# Patient Record
Sex: Male | Born: 1965 | Race: White | Hispanic: No | Smoking: Former smoker
Health system: Southern US, Community
[De-identification: ages and names within clinical notes are randomized; demographics above are authoritative.]

## PROBLEM LIST (undated history)

## (undated) DIAGNOSIS — I10 Essential (primary) hypertension: Secondary | ICD-10-CM

## (undated) DIAGNOSIS — K7460 Unspecified cirrhosis of liver: Secondary | ICD-10-CM

## (undated) HISTORY — PX: PARACENTESIS: SHX844

---

## 2013-10-30 LAB — CBC AND DIFFERENTIAL
HCT: 41 % (ref 41–53)
HEMOGLOBIN: 15 g/dL (ref 13.5–17.5)
Platelets: 248 10*3/uL (ref 150–399)
WBC: 20.4 10^3/mL

## 2013-10-30 LAB — TSH: TSH: 7.88 u[IU]/mL — AB (ref <=5.90)

## 2013-10-31 ENCOUNTER — Inpatient Hospital Stay: Payer: Self-pay | Admitting: Internal Medicine

## 2013-10-31 LAB — COMPREHENSIVE METABOLIC PANEL
ALK PHOS: 399 U/L — AB
AST: 187 U/L — AB (ref 15–37)
Albumin: 2 g/dL — ABNORMAL LOW (ref 3.4–5.0)
Anion Gap: 11 (ref 7–16)
BUN: 16 mg/dL (ref 7–18)
Bilirubin,Total: 27.5 mg/dL — ABNORMAL HIGH (ref 0.2–1.0)
CO2: 28 mmol/L (ref 21–32)
Calcium, Total: 8.2 mg/dL — ABNORMAL LOW (ref 8.5–10.1)
Chloride: 92 mmol/L — ABNORMAL LOW (ref 98–107)
Creatinine: 1.79 mg/dL — ABNORMAL HIGH (ref 0.60–1.30)
EGFR (Non-African Amer.): 43 — ABNORMAL LOW
GFR CALC AF AMER: 53 — AB
Glucose: 94 mg/dL (ref 65–99)
OSMOLALITY: 264 (ref 275–301)
Potassium: 2.8 mmol/L — ABNORMAL LOW (ref 3.5–5.1)
SGPT (ALT): 92 U/L — ABNORMAL HIGH
SODIUM: 131 mmol/L — AB (ref 136–145)
Total Protein: 5.4 g/dL — ABNORMAL LOW (ref 6.4–8.2)

## 2013-10-31 LAB — CBC WITH DIFFERENTIAL/PLATELET
BASOS ABS: 0.1 10*3/uL (ref 0.0–0.1)
BASOS PCT: 0.5 %
Eosinophil #: 0.1 10*3/uL (ref 0.0–0.7)
Eosinophil %: 0.8 %
HCT: 41.4 % (ref 40.0–52.0)
HGB: 13.9 g/dL (ref 13.0–18.0)
LYMPHS ABS: 1.2 10*3/uL (ref 1.0–3.6)
LYMPHS PCT: 6.9 %
MCH: 38.1 pg — ABNORMAL HIGH (ref 26.0–34.0)
MCHC: 33.4 g/dL (ref 32.0–36.0)
MCV: 114 fL — ABNORMAL HIGH (ref 80–100)
MONO ABS: 2 x10 3/mm — AB (ref 0.2–1.0)
MONOS PCT: 11.9 %
Neutrophil #: 13.4 10*3/uL — ABNORMAL HIGH (ref 1.4–6.5)
Neutrophil %: 79.9 %
Platelet: 231 10*3/uL (ref 150–440)
RBC: 3.64 10*6/uL — ABNORMAL LOW (ref 4.40–5.90)
RDW: 16.9 % — ABNORMAL HIGH (ref 11.5–14.5)
WBC: 16.8 10*3/uL — ABNORMAL HIGH (ref 3.8–10.6)

## 2013-10-31 LAB — APTT: Activated PTT: 32 secs (ref 23.6–35.9)

## 2013-10-31 LAB — URINALYSIS, COMPLETE
Glucose,UR: 50 mg/dL (ref 0–75)
Hyaline Cast: 13
Ketone: NEGATIVE
Leukocyte Esterase: NEGATIVE
NITRITE: NEGATIVE
PROTEIN: NEGATIVE
Ph: 6 (ref 4.5–8.0)
RBC,UR: 1 /HPF (ref 0–5)
Specific Gravity: 1.011 (ref 1.003–1.030)
Squamous Epithelial: NONE SEEN
WBC UR: NONE SEEN /HPF (ref 0–5)
White Blood Cell Cast: 49

## 2013-10-31 LAB — BILIRUBIN, DIRECT: Bilirubin, Direct: 24.3 mg/dL — ABNORMAL HIGH (ref 0.00–0.20)

## 2013-10-31 LAB — PROTIME-INR
INR: 1.3
PROTHROMBIN TIME: 16.3 s — AB (ref 11.5–14.7)

## 2013-10-31 LAB — AMMONIA: AMMONIA, PLASMA: 23 umol/L (ref 11–32)

## 2013-11-01 LAB — CBC WITH DIFFERENTIAL/PLATELET
BASOS PCT: 0.4 %
Basophil #: 0.1 10*3/uL (ref 0.0–0.1)
EOS ABS: 0.3 10*3/uL (ref 0.0–0.7)
Eosinophil %: 2 %
HCT: 40.4 % (ref 40.0–52.0)
HGB: 14 g/dL (ref 13.0–18.0)
LYMPHS ABS: 1.3 10*3/uL (ref 1.0–3.6)
Lymphocyte %: 9.6 %
MCH: 39.4 pg — AB (ref 26.0–34.0)
MCHC: 34.6 g/dL (ref 32.0–36.0)
MCV: 114 fL — ABNORMAL HIGH (ref 80–100)
Monocyte #: 1.9 x10 3/mm — ABNORMAL HIGH (ref 0.2–1.0)
Monocyte %: 13.4 %
Neutrophil #: 10.3 10*3/uL — ABNORMAL HIGH (ref 1.4–6.5)
Neutrophil %: 74.6 %
PLATELETS: 204 10*3/uL (ref 150–440)
RBC: 3.55 10*6/uL — ABNORMAL LOW (ref 4.40–5.90)
RDW: 17 % — AB (ref 11.5–14.5)
WBC: 13.8 10*3/uL — AB (ref 3.8–10.6)

## 2013-11-01 LAB — MAGNESIUM: MAGNESIUM: 2.2 mg/dL

## 2013-11-01 LAB — COMPREHENSIVE METABOLIC PANEL
ALT: 83 U/L — AB
ANION GAP: 9 (ref 7–16)
Albumin: 1.8 g/dL — ABNORMAL LOW (ref 3.4–5.0)
Alkaline Phosphatase: 348 U/L — ABNORMAL HIGH
BILIRUBIN TOTAL: 25.5 mg/dL — AB (ref 0.2–1.0)
BUN: 18 mg/dL (ref 7–18)
CO2: 29 mmol/L (ref 21–32)
Calcium, Total: 8 mg/dL — ABNORMAL LOW (ref 8.5–10.1)
Chloride: 96 mmol/L — ABNORMAL LOW (ref 98–107)
Creatinine: 1.67 mg/dL — ABNORMAL HIGH (ref 0.60–1.30)
EGFR (African American): 57 — ABNORMAL LOW
GFR CALC NON AF AMER: 47 — AB
Glucose: 63 mg/dL — ABNORMAL LOW (ref 65–99)
Osmolality: 268 (ref 275–301)
Potassium: 3.4 mmol/L — ABNORMAL LOW (ref 3.5–5.1)
SGOT(AST): 184 U/L — ABNORMAL HIGH (ref 15–37)
SODIUM: 134 mmol/L — AB (ref 136–145)
Total Protein: 4.8 g/dL — ABNORMAL LOW (ref 6.4–8.2)

## 2013-11-01 LAB — BODY FLUID CELL COUNT WITH DIFFERENTIAL
Basophil: 0 %
Eosinophil: 0 %
Lymphocytes: 14 %
Neutrophils: 33 %
Nucleated Cell Count: 120 /mm3
Other Cells BF: 0 %
Other Mononuclear Cells: 53 %

## 2013-11-01 LAB — GLUCOSE, SEROUS FLUID: Glucose, Body Fluid: 75 mg/dL

## 2013-11-01 LAB — PROTEIN, BODY FLUID: Protein, Body Fluid: 1 g/dL

## 2013-11-01 LAB — LACTATE DEHYDROGENASE, PLEURAL OR PERITONEAL FLUID: LDH, BODY FLUID: 113 U/L

## 2013-11-01 LAB — AMYLASE, BODY FLUID: Amylase, Body Fluid: 9 U/L

## 2013-11-01 LAB — ALBUMIN, FLUID (OTHER): Body Fluid Albumin: <0.6 g/dL

## 2013-11-02 LAB — COMPREHENSIVE METABOLIC PANEL
ANION GAP: 6 — AB (ref 7–16)
Albumin: 1.7 g/dL — ABNORMAL LOW (ref 3.4–5.0)
Alkaline Phosphatase: 351 U/L — ABNORMAL HIGH
BUN: 28 mg/dL — ABNORMAL HIGH (ref 7–18)
Bilirubin,Total: 26.9 mg/dL — ABNORMAL HIGH (ref 0.2–1.0)
CALCIUM: 7.6 mg/dL — AB (ref 8.5–10.1)
Chloride: 98 mmol/L (ref 98–107)
Co2: 27 mmol/L (ref 21–32)
Creatinine: 1.94 mg/dL — ABNORMAL HIGH (ref 0.60–1.30)
EGFR (African American): 48 — ABNORMAL LOW
EGFR (Non-African Amer.): 40 — ABNORMAL LOW
Glucose: 134 mg/dL — ABNORMAL HIGH (ref 65–99)
OSMOLALITY: 270 (ref 275–301)
POTASSIUM: 3 mmol/L — AB (ref 3.5–5.1)
SGOT(AST): 189 U/L — ABNORMAL HIGH (ref 15–37)
SGPT (ALT): 95 U/L — ABNORMAL HIGH
SODIUM: 131 mmol/L — AB (ref 136–145)
Total Protein: 4.9 g/dL — ABNORMAL LOW (ref 6.4–8.2)

## 2013-11-03 LAB — COMPREHENSIVE METABOLIC PANEL
ANION GAP: 10 (ref 7–16)
AST: 180 U/L — AB (ref 15–37)
Albumin: 1.6 g/dL — ABNORMAL LOW (ref 3.4–5.0)
Alkaline Phosphatase: 329 U/L — ABNORMAL HIGH
BILIRUBIN TOTAL: 25.3 mg/dL — AB (ref 0.2–1.0)
BUN: 31 mg/dL — AB (ref 7–18)
CO2: 25 mmol/L (ref 21–32)
Calcium, Total: 7.4 mg/dL — ABNORMAL LOW (ref 8.5–10.1)
Chloride: 98 mmol/L (ref 98–107)
Creatinine: 2.23 mg/dL — ABNORMAL HIGH (ref 0.60–1.30)
EGFR (African American): 41 — ABNORMAL LOW
GFR CALC NON AF AMER: 34 — AB
Glucose: 95 mg/dL (ref 65–99)
Osmolality: 273 (ref 275–301)
Potassium: 3.2 mmol/L — ABNORMAL LOW (ref 3.5–5.1)
SGPT (ALT): 95 U/L — ABNORMAL HIGH
Sodium: 133 mmol/L — ABNORMAL LOW (ref 136–145)
Total Protein: 4.6 g/dL — ABNORMAL LOW (ref 6.4–8.2)

## 2013-11-04 LAB — COMPREHENSIVE METABOLIC PANEL
ALBUMIN: 1.6 g/dL — AB (ref 3.4–5.0)
ALK PHOS: 324 U/L — AB
ALT: 105 U/L — AB
ANION GAP: 11 (ref 7–16)
BUN: 36 mg/dL — ABNORMAL HIGH (ref 7–18)
Bilirubin,Total: 24.2 mg/dL — ABNORMAL HIGH (ref 0.2–1.0)
CALCIUM: 7.4 mg/dL — AB (ref 8.5–10.1)
CHLORIDE: 97 mmol/L — AB (ref 98–107)
CREATININE: 2.21 mg/dL — AB (ref 0.60–1.30)
Co2: 23 mmol/L (ref 21–32)
EGFR (African American): 41 — ABNORMAL LOW
EGFR (Non-African Amer.): 34 — ABNORMAL LOW
GLUCOSE: 131 mg/dL — AB (ref 65–99)
OSMOLALITY: 273 (ref 275–301)
Potassium: 3.6 mmol/L (ref 3.5–5.1)
SGOT(AST): 177 U/L — ABNORMAL HIGH (ref 15–37)
Sodium: 131 mmol/L — ABNORMAL LOW (ref 136–145)
TOTAL PROTEIN: 4.7 g/dL — AB (ref 6.4–8.2)

## 2013-11-04 LAB — PHOSPHORUS: Phosphorus: 2.3 mg/dL — ABNORMAL LOW (ref 2.5–4.9)

## 2013-11-05 LAB — PROTEIN / CREATININE RATIO, URINE
Creatinine, Urine: 43.7 mg/dL (ref 30.0–125.0)
PROTEIN, RANDOM URINE: 8 mg/dL (ref 0–12)
Protein/Creat. Ratio: 183 mg/gCREAT (ref 0–200)

## 2013-11-05 LAB — CBC WITH DIFFERENTIAL/PLATELET
BASOS ABS: 0 10*3/uL (ref 0.0–0.1)
BASOS PCT: 0.2 %
Eosinophil #: 0 10*3/uL (ref 0.0–0.7)
Eosinophil %: 0.1 %
HCT: 37.4 % — ABNORMAL LOW (ref 40.0–52.0)
HGB: 12.5 g/dL — ABNORMAL LOW (ref 13.0–18.0)
LYMPHS ABS: 1.4 10*3/uL (ref 1.0–3.6)
Lymphocyte %: 8.7 %
MCH: 38.3 pg — AB (ref 26.0–34.0)
MCHC: 33.4 g/dL (ref 32.0–36.0)
MCV: 115 fL — AB (ref 80–100)
MONO ABS: 1.4 x10 3/mm — AB (ref 0.2–1.0)
MONOS PCT: 9.1 %
Neutrophil #: 12.8 10*3/uL — ABNORMAL HIGH (ref 1.4–6.5)
Neutrophil %: 81.9 %
Platelet: 227 10*3/uL (ref 150–440)
RBC: 3.26 10*6/uL — ABNORMAL LOW (ref 4.40–5.90)
RDW: 16.2 % — ABNORMAL HIGH (ref 11.5–14.5)
WBC: 15.6 10*3/uL — AB (ref 3.8–10.6)

## 2013-11-05 LAB — URINALYSIS, COMPLETE
BACTERIA: NONE SEEN
Blood: NEGATIVE
Glucose,UR: NEGATIVE mg/dL (ref 0–75)
KETONE: NEGATIVE
LEUKOCYTE ESTERASE: NEGATIVE
Nitrite: NEGATIVE
PROTEIN: NEGATIVE
Ph: 6 (ref 4.5–8.0)
RBC,UR: <1 /HPF (ref 0–5)
Specific Gravity: 1.005 (ref 1.003–1.030)
Squamous Epithelial: NONE SEEN

## 2013-11-05 LAB — PROTEIN ELECTROPHORESIS(ARMC)

## 2013-11-05 LAB — BODY FLUID CULTURE

## 2013-11-05 LAB — COMPREHENSIVE METABOLIC PANEL
Albumin: 2.1 g/dL — ABNORMAL LOW (ref 3.4–5.0)
Alkaline Phosphatase: 263 U/L — ABNORMAL HIGH
Anion Gap: 11 (ref 7–16)
BILIRUBIN TOTAL: 23.6 mg/dL — AB (ref 0.2–1.0)
BUN: 36 mg/dL — ABNORMAL HIGH (ref 7–18)
CHLORIDE: 102 mmol/L (ref 98–107)
Calcium, Total: 7.5 mg/dL — ABNORMAL LOW (ref 8.5–10.1)
Co2: 22 mmol/L (ref 21–32)
Creatinine: 2.29 mg/dL — ABNORMAL HIGH (ref 0.60–1.30)
EGFR (African American): 40 — ABNORMAL LOW
GFR CALC NON AF AMER: 33 — AB
Glucose: 102 mg/dL — ABNORMAL HIGH (ref 65–99)
Osmolality: 279 (ref 275–301)
POTASSIUM: 3.6 mmol/L (ref 3.5–5.1)
SGOT(AST): 143 U/L — ABNORMAL HIGH (ref 15–37)
SGPT (ALT): 99 U/L — ABNORMAL HIGH
Sodium: 135 mmol/L — ABNORMAL LOW (ref 136–145)
TOTAL PROTEIN: 4.7 g/dL — AB (ref 6.4–8.2)

## 2013-11-06 LAB — COMPREHENSIVE METABOLIC PANEL
Albumin: 2.8 g/dL — ABNORMAL LOW (ref 3.4–5.0)
Alkaline Phosphatase: 233 U/L — ABNORMAL HIGH
Anion Gap: 9 (ref 7–16)
BUN: 47 mg/dL — ABNORMAL HIGH (ref 7–18)
Bilirubin,Total: 20.5 mg/dL — ABNORMAL HIGH (ref 0.2–1.0)
Calcium, Total: 7.7 mg/dL — ABNORMAL LOW (ref 8.5–10.1)
Chloride: 105 mmol/L (ref 98–107)
Co2: 23 mmol/L (ref 21–32)
Creatinine: 1.89 mg/dL — ABNORMAL HIGH (ref 0.60–1.30)
EGFR (African American): 49 — ABNORMAL LOW
EGFR (Non-African Amer.): 41 — ABNORMAL LOW
Glucose: 111 mg/dL — ABNORMAL HIGH (ref 65–99)
Osmolality: 287 (ref 275–301)
Potassium: 3.4 mmol/L — ABNORMAL LOW (ref 3.5–5.1)
SGOT(AST): 121 U/L — ABNORMAL HIGH (ref 15–37)
SGPT (ALT): 92 U/L — ABNORMAL HIGH
Sodium: 137 mmol/L (ref 136–145)
Total Protein: 4.9 g/dL — ABNORMAL LOW (ref 6.4–8.2)

## 2013-11-07 LAB — UR PROT ELECTROPHORESIS, URINE RANDOM

## 2013-11-19 ENCOUNTER — Ambulatory Visit: Payer: Self-pay | Admitting: Gastroenterology

## 2013-11-26 ENCOUNTER — Ambulatory Visit: Payer: Self-pay | Admitting: Gastroenterology

## 2013-12-03 ENCOUNTER — Ambulatory Visit: Payer: Self-pay | Admitting: Gastroenterology

## 2013-12-03 LAB — HEPATIC FUNCTION PANEL
Alkaline Phosphatase: 146 U/L — AB (ref 25–125)
BILIRUBIN, TOTAL: 2.4 mg/dL

## 2013-12-03 LAB — BASIC METABOLIC PANEL
BUN: 21 mg/dL (ref 4–21)
CREATININE: 1.6 mg/dL — AB (ref <=1.3)
Glucose: 107 mg/dL
Potassium: 4.7 mmol/L (ref 3.4–5.3)
Sodium: 136 mmol/L — AB (ref 137–147)

## 2013-12-21 ENCOUNTER — Ambulatory Visit: Payer: Self-pay | Admitting: Gastroenterology

## 2014-05-11 NOTE — Discharge Summary (Signed)
PATIENT NAME:  Timothy Johns, Timothy Johns MR#:  [id number] DATE OF BIRTH:  [birth year]  DATE OF ADMISSION:  10/31/2013 DATE OF DISCHARGE:  11/07/2013  DISCHARGE DIAGNOSES:  1.  Severe jaundice secondary to alcoholic liver disease.  2.  Alcoholic hepatitis.  3.  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  4.  Acute renal failure.   DISCHARGE MEDICATIONS:  Melatonin 3 mg p.o. daily at bedtime, multivitamin 1 tablet daily, Aldactone 25 mg p.o. daily, prednisone dose tapering. The patient to take 35 mg on October 22 with 30 mg on October 23 with 25 mg on October 24 with 20 mg on 25th with 15 mg on 26th with 10 mg on 27th with 5 mg on 28th, patient advised to cut the dose by 5 mg every day.   FOLLOWUP: The patient needs to follow up Dr. Bluford Kaufmann in about 10 days.   CONSULTATIONS:  1.  Munsoor Lizabeth Leyden, MD 2.  Ezzard Standing. Bluford Kaufmann, MD  PRIMARY CARE PHYSICIAN: Richard L. Sullivan Lone, MD   HOSPITAL COURSE: This is a 49 year old male patient sent in per primary doctor because of jaundice. The patient was found to have a total bilirubin of 27.5 and his liver functions were quite elevated with ALT of 92, AST of 187, and alkaline phosphatase was 399. The patient admitted to medical service secondary to alcoholic hepatitis with severe jaundice. The patient drinks about 4 glasses of liquor on a regular basis since August 2014. The patient noticed that he had more swelling of his abdomen for about a week with jaundice. The patient's bilirubin was 33.7 at Dr. Elisabeth Cara office. He had severe scleral icterus and he looked jaundiced when he came. He had a chest x-ray which was normal. Ultrasound showed hepatomegaly with 22 cm liver span and also cholelithiasis. The patient had moderate ascites. He was admitted for severe jaundice with alcoholic hepatitis and GI consult was obtained. The patient was placed on CIWA protocol for severe alcohol abuse. The patient was seen by Dr. Bluford Kaufmann and he suggested that we monitor  for withdrawal symptoms and get a paracentesis to evaluate for SBP. If SBP was negative he would start the patient on prednisone. The patient was also placed on salt and fluid restriction, monitoring of daily LFTs, and started on Lasix and Aldactone. The patient's initial white count was elevated at 16.8. Because of concern for elevated white count and ascites started on antibiotics. The patient did have a paracentesis done on October 15 and cytology did not show any infection. The patient's pleural fluid cytology also was negative for malignancy. After that, the patient was given prednisone for hepatitis. The patient's LFTs were persistently elevated for 2-3 days after starting prednisone and patient's LFTs and jaundice slightly got better on the prednisone. On the day of discharge, his bilirubin improved to 20.5 and LFTs showed ALT of 92, 01, AST 121, and alkaline phosphatase 233. I spoke with Dr. Bluford Kaufmann and he said the patient can go home on prednisone dose pending and he will see the patient in the office.  Ascites, which was recurrent. The patient had paracentesis done on October 15 and removal of 4.75 liters. He also had another paracentesis done on October 19 and they removed about 5.35 liters.   Acute renal failure. The patient was started on Lasix and Aldactone. The patient's creatinine was 1.79 and potassium was 2.8; that was on October 14. The patient's creatinine started to increase to 1.94 on October 16; then it went to  2.2 on October 18, and because of the concern for possible hepatorenal syndrome, we have consulted nephrology. The patient's Lasix was stopped and  he and an albumin infusion, about 6 doses. The patient's albumin 1.7 on October 16. It improved and it was 2.8 after the infusion of albumin. He also improved with paracentesis and also holding the Lasix. His creatinine improved. Nephrology saw the patient and patient's urine output was fine and he started on Aldactone again. The patient's  hypokalemia also resolved. Acute renal failure secondary to volume loss, so he was given gentle hydration as well. The patient's potassium was 3.4 on the day of discharge.   DISCHARGE VITAL SIGNS: Temperature 97.8, heart rate 68, blood pressure 150/76, saturation 93%.   He felt better and jaundice was improving. Advised him to quit alcohol and follow up with GI and also nephrology. The patient did receive Rocephin while he was in the hospital for SBP, but all the fluid cultures were negative so we did not discharge him on any antibiotics. He was afebrile as well.   TIME SPENT: More than 30 minutes.    ____________________________ Katha HammingSnehalatha Jimmy Plessinger, MD sk:ts D: 11/10/2013 13:47:44 ET T: 11/11/2013 02:28:00 ET JOB#: [id number]  cc: Katha HammingSnehalatha Angelo Caroll, MD, <Dictator> Katha HammingSNEHALATHA Corbitt Cloke MD ELECTRONICALLY SIGNED 11/22/2013 19:49

## 2014-05-11 NOTE — H&P (Signed)
PATIENT NAME:  Timothy Johns, Timothy Johns MR#:  [id number] DATE OF BIRTH:  [birth year]  DATE OF ADMISSION:  10/31/2013  ADMITTING PHYSICIAN: Enid Baas, MD.   PRIMARY CARE PHYSICIAN: Dr. Sullivan Lone.   CHIEF COMPLAINT: Jaundice.   HISTORY OF PRESENT ILLNESS: Timothy Johns is a 49 year old male with no significant past medical history other than significant alcohol abuse and ongoing smoking, was sent in from Dr. Elisabeth Cara office due to jaundice and also abnormal labs. The patient has been drinking since he was 49 years of age. Ever since last year he was going through a separation from August 2014 and has been drinking heavily. At an average day he drinks about 4 glasses of liquor. He has noticed that his abdomen has been getting more swollen, complaining of abdominal pain for almost 1 week now, and jaundice worse since then. The patient does not have a PCP but his father sees Dr. Sullivan Lone, so patient was taken to see Dr. Sullivan Lone yesterday who ordered some labs. The bilirubin was elevated at 33.7 so he was advised to come for admission. The patient denies any IV drugs. No tattoos. No history of any blood transfusion in the past. The patient also complains of poor appetite, early satiety, and also pedal edema going on since the last week.   PAST MEDICAL HISTORY: Alcohol abuse.   PAST SURGICAL HISTORY: None.   ALLERGIES TO MEDICATIONS: No known drug allergies.   CURRENT MEDICATIONS: None, but spironolactone was started yesterday by Dr. Sullivan Lone.   SOCIAL HISTORY: Lives at home by himself, smokes about 1-1/2 packs per day, and drinks about 4 glasses of liquor every day.   FAMILY HISTORY: Brother with diabetes mellitus. Mom with heart disease.   REVIEW OF SYSTEMS:  CONSTITUTIONAL: No fever. Positive for fatigue and weakness.  EYES: Positive for blurry vision. No double vision or glaucoma. Positive for reading glasses.   ENT: No tinnitus, ear pain, hearing loss, epistaxis or discharge.  RESPIRATORY: No  cough, wheeze, hemoptysis or COPD.   CARDIOVASCULAR: No chest pain, orthopnea, edema, arrhythmia, palpitations, or syncope.  GASTROINTESTINAL: Positive for nausea and also jaundice and also abdominal pain. No hematemesis or melena.  GENITOURINARY: No dysuria, hematuria, renal calculus, frequency, or incontinence.  ENDOCRINE: No polyuria, nocturia, thyroid problems, heat or cold intolerance.  HEMATOLOGY: No anemia, easy bruising or bleeding.  SKIN: No acne, rash or lesions.  MUSCULOSKELETAL: No neck, back, shoulder pain, arthritis. No gout.  NEUROLOGIC: No numbness, weakness, CVA, TIA or seizures.  PSYCHOLOGICAL: No anxiety, insomnia, depression.   PHYSICAL EXAMINATION: VITAL SIGNS: Temperature 98 degrees Fahrenheit, pulse 101, respirations 20, blood pressure 135/81, pulse oximetry 92% on room air.  GENERAL: Heavily built, well-nourished male lying in bed, not in any acute distress. Flat affect.   HEENT: Normocephalic, atraumatic. Pupils equal, round, reacting to light. Icteric sclerae present. Extraocular movements intact. Oropharynx clear without erythema, mass or exudates.  NECK: Supple. No thyromegaly, JVD or carotid bruits. No lymphadenopathy.  LUNGS: Moving air bilaterally. No wheeze or crackles. No use of accessory muscles for breathing. Decreased bibasilar breath sounds.  CARDIOVASCULAR: S1, S2, regular rate and rhythm. No murmurs, rubs, or gallops.  ABDOMEN: Obese, distended, fluid clearly present. I am unable to palpate hepatomegaly due to significantly distended abdomen. Hypoactive bowel sounds are present.  EXTREMITIES: With 2+ pedal edema of both extremities, but able to palpate dorsalis pedis pulses bilaterally. No clubbing or cyanosis noted.  SKIN: Palmer erythema is present. spider nevi especially on anterior chest and also on the face present.  Jaundiced skin all over noted.  NEUROLOGIC: Cranial nerves intact. No focal motor or sensory deficits. Asterixis very minimal, not  significant at this time.  PSYCHOLOGICAL: The patient is awake, alert, oriented x 3.  LYMPHATICS: No cervical or inguinal lymphadenopathy.   LABORATORY DATA: Chest x-ray showing mild pulmonary scarring in both lungs. No acute disease seen. Ultrasound of the abdomen showing hepatomegaly with 22 cm liver span. No focal mass lesion noted. Cholelithiasis is present, but no sonographic evidence of Murphy sign. Moderate amount of ascites noted.    LABORATORY DATA: Sodium 131, potassium 3.3, chloride 86, bicarbonate 25, BUN 10, creatinine 1.2, glucose 116. Total albumin is 3.4, total bilirubin 33.7, ALT of 85, AST 187. Alkaline phosphatase elevated at 450 noted. TSH is slightly elevated at 7.8.   WBC 20.4, hemoglobin 15.3, hematocrit 41.0, and platelets of 248,000 noted. Laboratories from today are pending.   ASSESSMENT AND PLAN: A 49 year old man with past medical history significant for alcohol abuse sent in from primary care physicians office for jaundice, elevated bilirubin, and elevated white count.  1.  Severe jaundice, likely acute alcoholic hepatitis. However, hepatitis panel is pending. Check direct bili to rule out any  obstruction, but ultrasound does not indicate any obstruction. Gastroenterology has been consulted to follow up complete metabolic panel again.  2.  Ascites. A moderate amount of ascites noted on the ultrasound with his leukocytosis and his abdominal pain. Will order a paracentesis and send for laboratories to  rule out infection. Until then, will cover with Rocephin at this point.  3.  Alcohol abuse. The patient has been counseled against alcohol use now.  4.  Liver cirrhosis with significant ascites. Gastroenterology has been consulted. Likely cause alcoholic liver cirrhosis. Started on Lasix and also Aldactone at this time. There is no evidence of any previous GI bleed.  5.  Tobacco abuse. Counseled against smoking for 3 minutes, started on Nicotrol inhaler.  6.  Deep vein  prophylaxis with SCDs and also to TEDs.   CODE STATUS: Full code.   TIME SPENT ON ADMISSION: 50 minutes.    ____________________________ Enid Baasadhika Maggi Hershkowitz, MD rk:at D: 10/31/2013 16:37:13 ET T: 10/31/2013 17:29:08 ET JOB#: [id number]  cc: Enid Baasadhika Mikiyah Glasner, MD, <Dictator> Richard L. Sullivan LoneGilbert, MD Enid BaasADHIKA Zakar Brosch MD ELECTRONICALLY SIGNED 10/31/2013 18:22

## 2014-05-11 NOTE — Consult Note (Signed)
Chief Complaint:  Subjective/Chief Complaint C/O pruritis today. No abd pain. Have lost 11 lb of fluid since admission so far. LFt very slowly improving.   VITAL SIGNS/ANCILLARY NOTES: **Vital Signs.:   17-Oct-15 09:08  Vital Signs Type Pre Medication  Pulse Pulse 88  Systolic BP Systolic BP 680  Diastolic BP (mmHg) Diastolic BP (mmHg) 69  Mean BP 85   Brief Assessment:  GEN no acute distress   Cardiac Regular   Respiratory clear BS   Gastrointestinal distended with ascites. nontender   Additional Physical Exam Jaundice throughout.   Lab Results: Hepatic:  17-Oct-15 04:56   Bilirubin, Total  25.3  Alkaline Phosphatase  329 (46-116 NOTE: New Reference Range 08/07/13)  SGPT (ALT)  95 (14-63 NOTE: New Reference Range 08/07/13)  SGOT (AST)  180  Total Protein, Serum  4.6  Albumin, Serum  1.6  Routine Chem:  17-Oct-15 04:56   Glucose, Serum 95  BUN  31  Creatinine (comp)  2.23  Sodium, Serum  133  Potassium, Serum  3.2  Chloride, Serum 98  CO2, Serum 25  Calcium (Total), Serum  7.4  Osmolality (calc) 273  eGFR (African American)  41  eGFR (Non-African American)  34 (eGFR values <48m/min/1.73 m2 may be an indication of chronic kidney disease (CKD). Calculated eGFR, using the MRDR Study equation, is useful in  patients with stable renal function. The eGFR calculation will not be reliable in acutely ill patients when serum creatinine is changing rapidly. It is not useful in patients on dialysis. The eGFR calculation may not be applicable to patients at the low and high extremes of body sizes, pregnant women, and vetetarians.)  Anion Gap 10   Assessment/Plan:  Assessment/Plan:  Assessment Alcoholic hepatitis. Ascites.   Plan Continue diuretics. Continue daily wt. Continue to moniter LFT and renal function.   Electronic Signatures: OVerdie Shire(MD)  (Signed 17-Oct-15 12:10)  Authored: Chief Complaint, VITAL SIGNS/ANCILLARY NOTES, Brief Assessment, Lab  Results, Assessment/Plan   Last Updated: 17-Oct-15 12:10 by OVerdie Shire(MD)

## 2014-05-11 NOTE — Consult Note (Signed)
PATIENT NAME:  Timothy LundborgNDERS, Timothy Johns MR#:  [id number] DATE OF BIRTH:  [birth year]  DATE OF CONSULTATION:  11/01/2013  CONSULTING PHYSICIAN:  Ezzard StandingPaul Y. Pearlee Arvizu, MD  HISTORY OF PRESENT ILLNESS: The patient is a 49 year old white male without any significant past medical history who does admit to significant alcohol abuse and smoking. He presented to Dr. Elisabeth CaraGilbert's office due to jaundice and abnormal labs. He has been drinking since age 49. Ever since he went through a separation last August he has been drinking rather heavily. He admitted to having at least 4 glasses of liquor per day. Over the past week or so he has been noticing increasing abdominal bloating and distention as well as darkening of his eyes, skin and urine. When he was seen at Dr. Elisabeth CaraGilbert's office his bilirubin was elevated to 33.7; therefore, he was advised to come to the hospital for admission. He does complain of some decreased appetite and early satiety and also admitted to having some pedal edema going on since the last week.   I tried to see the patient right after admission but he was sent to radiology for ultrasound and chest x-ray. Therefore, I did not have a chance to see him until this morning.   PAST MEDICAL HISTORY: Notable for alcohol abuse.   SOCIAL HISTORY:  The patient denies any tobacco history. He still smokes 1-1/2 packs per day, as well as drinking 4 glasses of liquor per day.   ALLERGIES:  No known drug allergies.   MEDICATIONS:  He was not on any medication except for the Aldactone that was just started by Dr. Sullivan LoneGilbert.   FAMILY HISTORY: Notable for heart disease and diabetes.   REVIEW OF SYSTEMS: Positive for fatigue and weakness but no fevers or chills. There are no visual or hearing changes. No coughing or shortness of breath, despite the abdominal distention. There is some nausea but no vomiting. There was some abdominal pain initially. Interestingly, today the pain is gone. There is no gross hematochezia or melena. The  rest of the review of symptoms is negative.   PHYSICAL EXAMINATION:  GENERAL: The patient appears to be in no acute distress. He is deeply jaundiced.  VITAL SIGNS: He is afebrile. His vital signs are stable.  HEENT: Normocephalic, atraumatic. He has icteric sclerae. Throat was clear.  NECK: Supple.  CARDIAC: Regular rhythm and rate.  LUNGS: Clear bilaterally.  ABDOMEN: Rather distended abdomen with evidence of ascites. I did not appreciate any hepatomegaly. He has some bowel sounds. Abdomen was nontender. EXTREMITIES: Show 2+ pedal edema bilaterally.  SKIN: Again shows jaundice everywhere.  NEUROLOGIC: Examination is nonfocal. This patient is pretty alert.   Chest x-ray:  Showed some mild pulmonary scars in both lungs.   Ultrasound showed hepatomegaly with a 22-cm liver span. There is a moderate amount of ascites. There are no focal lesions. There is evidence of gallstones but no evidence of acute cholecystitis. Interestingly, paracentesis was not done, which I thought was going to happen.   LABORATORY DATA: Sodium was 131, potassium 3.3, chloride 86, BUN 10, creatinine 1.2, glucose 166, total bilirubin is 33.7, ALT 85, AST 187, alkaline phosphatase is 450. White count was 20.4, hemoglobin 15.3, hematocrit 41, platelet count is 248,000.   ASSESSMENT AND PLAN: This is a patient with alcohol abuse who appears to have alcoholic hepatitis as well as significant ascites. The patient was already started on antibiotics in case he had spontaneous bacterial peritonitis, although there is no real evidence of that, at least today.  The patient does need to have a diagnostic and therapeutic paracentesis done. That will help him with the ascites. If the ascitic fluid is negative for any signs of peritonitis, then it is reasonable to start him on some prednisone to help with alcoholic hepatitis. I told him it may take a long time for the jaundice to go away. We need to watch for any withdrawal symptoms since he  will not be able to drink in the hospital. He will also need to be on salt and fluid restriction after the paracentesis. We will need to check daily weight. He will also need to be started on diuretics, a combination of Lasix and Aldactone, to prevent recurrence of ascites, which will happen.  We need to continue to monitor the liver enzymes.   Thank you for the referral. I will continue to follow the patient.    ____________________________ Ezzard Standing. Bluford Kaufmann, MD pyo:lt D: 11/02/2013 12:18:00 ET T: 11/02/2013 13:16:10 ET JOB#: [id number]  cc: Ezzard Standing. Bluford Kaufmann, MD, <Dictator> Ezzard Standing Kelbie Moro MD ELECTRONICALLY SIGNED 11/04/2013 9:12

## 2014-05-11 NOTE — Consult Note (Signed)
PATIENT NAME:  Timothy Johns, Timothy Johns MR#:  [id number] DATE OF BIRTH:  Jun 23, 1965  DATE OF CONSULTATION:  11/04/2013  REFERRING PHYSICIAN:   CONSULTING PHYSICIAN:  Timothy Hands Lizabeth Leyden, MD  REQUESTING PHYSICIAN: Timothy Johns, M.D.   REASON FOR CONSULTATION: Acute renal failure.   HISTORY OF PRESENT ILLNESS: The patient is a pleasant, 49 year old, Caucasian male, with a past medical history of alcohol abuse, who presented to [facility] with fatigue and increasing abdominal girth. The patient reports that he had worsening alcohol abuse since October 2014. At that point in time, he separated from his wife. He reports that he was drinking at least 4 mixed drinks per day and sometimes more. He was found to have multiple metabolic derangements upon admission. His AST was noted as being 187, and ALT was 92. The patient had an abdominal ultrasound, which showed hepatomegaly. Total bilirubin was quite high at 27.5 and direct bilirubin was 24.3. Alkaline phosphatase was also high at 399. Bilirubin has started to slowly trend down. Upon admission, creatinine was elevated at 1.79, but is now up to 2.2. He was on diuretic therapy; however, this has now been reduced. He is currently on Lasix 20 mg p.o. daily. He is also on spironolactone 25 mg p.o. daily. He is also being followed actively by gastroenterology.   PAST MEDICAL HISTORY: Alcohol abuse.   PAST SURGICAL HISTORY: None.   ALLERGIES: NO KNOWN DRUG ALLERGIES, BUT THE PATIENT IS ALLERGIC TO CAT DANDER.   CURRENT INPATIENT MEDICATIONS INCLUDE: 1. Lasix 20 mg p.o. daily.  2. Zofran 4 mg IV q.4 h. p.r.n. nausea or vomiting.  3. Roxicodone 5 mg p.o. q.6 h. p.r.n. moderate pain. 4. Spironolactone 25 mg p.o. daily.  5. Ceftriaxone 1 gram IV q.24 h. 6. Nicotine patch 14 mg transdermal daily.  7. Potassium chloride 20 mEq p.o. daily.  8. Prednisone taper.   SOCIAL HISTORY: The patient is currently separated and has not yet finalized  his divorce. He works as an Press photographer. He has history of tobacco abuse and smokes 1-1/2 packs of cigarettes per day. He has history of alcohol abuse and was drinking 4 mixed drinks per day, but has not had a drink over the past 11 days.   FAMILY HISTORY: The patient's mother had history of endocarditis that required a valve replacement. The patient's father has diabetes mellitus.   REVIEW OF SYSTEMS:  CONSTITUTIONAL: The patient denies fevers or chills. Reports fatigue and weakness.  EYES: Denies diplopia, reports yellowing of his eyes.  HEENT: Denies headaches, hearing loss. Denies epistaxis.  CARDIOVASCULAR: Denies chest pain, palpitations, PND.  RESPIRATORY: Denies cough, shortness of breath, or hemoptysis.  GASTROINTESTINAL: Reports increasing abdominal girth. Denies nausea, vomiting or diarrhea.  GENITOURINARY: Denies frequency, urgency, or dysuria.  MUSCULOSKELETAL: Denies joint pain, swelling or redness.  INTEGUMENTARY: Denies skin rashes or lesions.  NEUROLOGIC: Denies focal extremity numbness, weakness, or tingling.  PSYCHIATRIC: Reports he has had feelings of depression.  ENDOCRINE: Denies polyuria or polydipsia.  HEMATOLOGIC AND LYMPHATIC: Denies easy bruisability, bleeding, or swollen lymph nodes.  ALLERGY AND IMMUNOLOGIC: Denies seasonal allergies or history of immunodeficiency.   PHYSICAL EXAMINATION:  VITAL SIGNS: Temperature 97.7, pulse 70, respirations 18, blood pressure 124/71, pulse oximetry 93% on room air.  GENERAL: Reveals well-developed, well-nourished Caucasian male, currently in no acute distress.  HEENT: Normocephalic, atraumatic. Extraocular movements are intact. Pupils equal, round, reactive to light. There is significant scleral icterus. Conjunctivae are pink. No epistaxis noted. Gross hearing intact.  Oral mucosa moist.  NECK: Supple and without JVD or lymphadenopathy.  LUNGS: Clear to auscultation bilaterally with normal respiratory  effort.  HEART: S1, S2 regular rate and rhythm. No murmurs, rubs, or gallops appreciated.  ABDOMEN: Quite distended, fluid wave noted. No tenderness or rebound noted. No guarding noted.  EXTREMITIES: No clubbing, cyanosis, or edema.  NEUROLOGIC: The patient is awake, alert, and oriented to time, person, and place. Strength is 5/5 in both upper and lower extremities.  GENITOURINARY: No suprapubic tenderness noted at this time.  MUSCULOSKELETAL: No joint redness, swelling or tenderness appreciated.  SKIN: Warm and dry and is significantly jaundiced.  PSYCHIATRIC: The patient with appropriate affect and appears to have good insight into his current illness.   LABORATORY DATA: Sodium 131, potassium 3.6, chloride 97, CO2 of 23, BUN 36, creatinine 2.2, glucose 131, total protein 4.7, albumin 1.6, total bilirubin 24.2, alkaline phosphatase 324, AST 177, ALT 105. CBC shows WBC 13.8, hemoglobin 14, hematocrit 40, platelets of 204,000.   Abdominal ultrasound shows hepatomegaly with 22 cm liver span. The right kidney was 13.5 cm and the left kidney was 11.7 cm. No hydronephrosis noted on either side. There is also a moderate amount of ascites.   ASSESSMENT AND PLAN: This is a 49 year old, Caucasian male, with past medical history of chronic alcohol abuse, who presented [facility] with increasing abdominal girth and fatigue. The patient now with acute renal failure, hyponatremia, alcoholic hepatitis.   PROBLEM LIST:  1. Acute renal failure.  2. Hyponatremia.  3. Alcoholic hepatitis with ascites.  4. Alcohol abuse with other alcohol induced disorder.   PLAN: The patient presents with long-standing alcohol abuse. He was having at least 4 mixed drinks per day. He stopped drinking 11 days ago, however. He does appear to have acute alcoholic hepatitis with ascites. We were asked to see him for acute renal failure. He was started on Lasix and spironolactone. We recommend holding these  for several days. For now, we will administer 0.9 normal saline at 60 mL/hour. We will also give albumin 25 grams IV q.8 h. for the next 48 hours. Agree with prednisone taper to treat the alcoholic hepatitis. The patient appears to be willing to quit alcohol, but will likely need additional support. Would also follow gastroenterology recommendations, at this time.   I would like to thank Dr. Luberta MutterKonidena for this kind referral.    ____________________________ Lennox PippinsMunsoor N. Lilliane Sposito, MD mnl:JT D: 11/04/2013 10:33:34 ET T: 11/04/2013 10:50:30 ET JOB#: [id number]  cc: Lennox PippinsMunsoor N. Kylei Purington, MD, <Dictator>  Ria CommentMUNSOOR N Laiklyn Pilkenton MD ELECTRONICALLY SIGNED 11/22/2013 10:12

## 2014-05-11 NOTE — Consult Note (Signed)
Chief Complaint:  Subjective/Chief Complaint Feels better after paracentesis yesterday. 4.75L drained. Ascitic fluid transudate. No evidence of SBP. Minimal abdominal pain. No signs of withdrawal.   VITAL SIGNS/ANCILLARY NOTES: **Vital Signs.:   16-Oct-15 04:25  Vital Signs Type Routine  Temperature Temperature (F) 98  Celsius 36.6  Temperature Source oral  Pulse Pulse 84  Respirations Respirations 20  Systolic BP Systolic BP 623  Diastolic BP (mmHg) Diastolic BP (mmHg) 64  Mean BP 81  Pulse Ox % Pulse Ox % 91  Pulse Ox Activity Level  At rest  Oxygen Delivery Room Air/ 21 %   Brief Assessment:  GEN no acute distress   Cardiac Regular   Respiratory clear BS   Gastrointestinal distended with ascites. nontender   Lab Results: BF Analysis:  15-Oct-15 14:10   Protein, BF 1.0 (The reference range and other method performance specifications have not been established for this body fluid. The test result must be integrated into the clinical context for interpretation.)  Body Fluid Source PLEURAL FLUID  Result(s) reported on 01 Nov 2013 at 05:28PM.  Albumin, Body Fluid < 0.6 (The reference range and other method performance specifications have not been established for this body fluid. The test result must be integrated into the clinical context for interpretation.)  Glucose, BF 75 (The reference range and other method performance specifications have not been established for this body fluid. The test result must be integrated into the clinical context for interpretation.)  LDH, BF 113 (The reference range and other method performance specifications have not been established for this body fluid. The test result must be integrated into the clinical context for interpretation.)  Amylase (BF) 9 (The reference range and other method performance specifications have not been established for this body fluid. The test result must be integrated into the clinical context for  interpretation.)  Specific Gravity (BF) 1.013  Body Fluid Source (CC) ABDOMINAL FLUID  Color - (BF) YELLOW  Clarity (BF) HAZY  NCC  (nucleated cell count) 120  Neutrophils  (BF) 33  Lymphocytes  (BF) 14  Monocytes/Macrophages  (BF) 53  Eosinophil  (BF) 0  Basophil  (BF) 0  Other Cells  (BF) 0  Hepatic:  16-Oct-15 12:27   Bilirubin, Total  26.9  Alkaline Phosphatase  351 (46-116 NOTE: New Reference Range 08/07/13)  SGPT (ALT)  95 (14-63 NOTE: New Reference Range 08/07/13)  SGOT (AST)  189  Total Protein, Serum  4.9  Albumin, Serum  1.7  Routine Chem:  15-Oct-15 14:10   Result Comment Specific Gravity - - *** THIS IS A CORRECTED REPORT *** Body Fluid Source - PLEASE DISREGARD PREVIOUS RESULT.  - Result changed to abdominal fluid.  - to match comment input.  Result(s) reported on 02 Nov 2013 at [facility].  16-Oct-15 12:27   Glucose, Serum  134  BUN  28  Creatinine (comp)  1.94  Sodium, Serum  131  Potassium, Serum  3.0  Chloride, Serum 98  CO2, Serum 27  Calcium (Total), Serum  7.6  Osmolality (calc) 270  eGFR (African American)  48  eGFR (Non-African American)  40 (eGFR values <55m/min/1.73 m2 may be an indication of chronic kidney disease (CKD). Calculated eGFR, using the MRDR Study equation, is useful in  patients with stable renal function. The eGFR calculation will not be reliable in acutely ill patients when serum creatinine is changing rapidly. It is not useful in patients on dialysis. The eGFR calculation may not be applicable to patients at the low and  high extremes of body sizes, pregnant women, and vetetarians.)  Anion Gap  6   Radiology Results: [place]:    15-Oct-15 15:45, US Guided Paracentesis  US Guided Paracentesis   REASON FOR EXAM:    ascites, leukocytosis, abdominal pain  COMMENTS:       PROCEDURE: [place]  - US GUIDED PARACENTESIS  - Nov 01 2013  3:45PM     CLINICAL DATA:  ascites, leukocytosis, abdominal pain    EXAM:  ULTRASOUND GUIDED  PARACENTESIS    TECHNIQUE:  The procedure, risks (including but not limited to bleeding,  infection, organ damage ), benefits, and alternatives were explained  to the patient. Questions regarding the procedure were encouraged  and answered. The patient understands and consentsto the procedure.  Survey ultrasound of the abdomen was performed and an appropriate  skin entry site in the left lower abdomen was selected. Skin site  was marked, prepped with chlorhexidine, and draped in usual sterile  fashion, and infiltrated locally with 1% lidocaine. A  Safe-T-Centesis needle was advanced into the peritoneal space until  fluid could be aspirated. The sheath was advanced and the needle  removed. 4.75 L of amber coloredascites were aspirated. No immediate  complication.     IMPRESSION:  Technically successful ultrasound guided paracentesis, removing 4.75  L of ascites.      Electronically Signed    By: Arne Cleveland M.D.    On: 11/01/2013 16:34         Verified By: Kandis Cocking, M.D.,   Assessment/Plan:  Assessment/Plan:  Assessment Alcoholic hepatitis. Ascites. S/P paracentesis. Probably has another 5 L of ascites remaining. in the abdomen.   Plan Since no evidence of SBP, started high dose of prednisone with gradula taper to see if LFT will improve overtime. Consider increasing aldactone to 16m daily and lasix 437mdaily as long as renal function ok. Cr upto 1.9 today. May need more therapeutic paracentesis prior to discharge. Will follow. Thanks.   Electronic Signatures: OhVerdie ShireMD)  (Signed 16-Oct-15 13:24)  Authored: Chief Complaint, VITAL SIGNS/ANCILLARY NOTES, Brief Assessment, Lab Results, Radiology Results, Assessment/Plan   Last Updated: 16-Oct-15 13:24 by OhVerdie ShireMD)

## 2014-05-11 NOTE — Consult Note (Signed)
Chief Complaint:  Subjective/Chief Complaint Weight going up again despite lasix and aldactone. LFT slowly improving.   VITAL SIGNS/ANCILLARY NOTES: **Vital Signs.:   18-Oct-15 08:23  Vital Signs Type Routine  Temperature Temperature (F) 97.7  Celsius 36.5  Temperature Source oral  Pulse Pulse 70  Respirations Respirations 18  Systolic BP Systolic BP 762  Diastolic BP (mmHg) Diastolic BP (mmHg) 71  Mean BP 88  Pulse Ox % Pulse Ox % 93  Pulse Ox Activity Level  At rest  Oxygen Delivery Room Air/ 21 %   Brief Assessment:  GEN no acute distress   Cardiac Regular   Respiratory clear BS   Gastrointestinal Distended with ascites.   Lab Results: Hepatic:  18-Oct-15 06:12   Bilirubin, Total  24.2  Alkaline Phosphatase  324 (46-116 NOTE: New Reference Range 08/07/13)  SGPT (ALT)  105 (14-63 NOTE: New Reference Range 08/07/13)  SGOT (AST)  177  Total Protein, Serum  4.7  Albumin, Serum  1.6  Routine Chem:  18-Oct-15 06:12   Phosphorus, Serum  2.3 (Result(s) reported on 04 Nov 2013 at 11:14AM.)  Glucose, Serum  131  BUN  36  Creatinine (comp)  2.21  Sodium, Serum  131  Potassium, Serum 3.6  Chloride, Serum  97  CO2, Serum 23  Calcium (Total), Serum  7.4  Osmolality (calc) 273  eGFR (African American)  41  eGFR (Non-African American)  34 (eGFR values <50m/min/1.73 m2 may be an indication of chronic kidney disease (CKD). Calculated eGFR, using the MRDR Study equation, is useful in  patients with stable renal function. The eGFR calculation will not be reliable in acutely ill patients when serum creatinine is changing rapidly. It is not useful in patients on dialysis. The eGFR calculation may not be applicable to patients at the low and high extremes of body sizes, pregnant women, and vetetarians.)  Anion Gap 11   Assessment/Plan:  Assessment/Plan:  Assessment Alcoholic hepatitis. Ascites.   Plan Continue prednisone. Since we are limited in giving diuretics  due to his renal function, would schedule repeat therapeutic paracentesis tomorrow. thanks   Electronic Signatures: OVerdie Shire(MD)  (Signed 18-Oct-15 11:24)  Authored: Chief Complaint, VITAL SIGNS/ANCILLARY NOTES, Brief Assessment, Lab Results, Assessment/Plan   Last Updated: 18-Oct-15 11:24 by OVerdie Shire(MD)

## 2014-05-11 NOTE — Consult Note (Signed)
Chief Complaint:  Subjective/Chief Complaint Overall doing ok. Diuretics put on hold and patient started on NS with albumin infusion. For repeat paracentesis later. T.bili continuing to drop slowly. Minimal itching today.   VITAL SIGNS/ANCILLARY NOTES: **Vital Signs.:   19-Oct-15 05:00  Vital Signs Type Routine  Temperature Temperature (F) 97.6  Celsius 36.4  Temperature Source oral  Pulse Pulse 57  Respirations Respirations 20  Systolic BP Systolic BP 818  Diastolic BP (mmHg) Diastolic BP (mmHg) 62  Mean BP 79  Pulse Ox % Pulse Ox % 94  Pulse Ox Activity Level  At rest  Oxygen Delivery Room Air/ 21 %   Brief Assessment:  GEN no acute distress   Cardiac Regular   Respiratory clear BS   Gastrointestinal distended with ascites.   Lab Results: Hepatic:  18-Oct-15 06:12   Bilirubin, Total  24.2  Alkaline Phosphatase  324 (46-116 NOTE: New Reference Range 08/07/13)  SGPT (ALT)  105 (14-63 NOTE: New Reference Range 08/07/13)  SGOT (AST)  177  Total Protein, Serum  4.7  Albumin, Serum  1.6  Routine Chem:  18-Oct-15 06:12   Glucose, Serum  131  BUN  36  Creatinine (comp)  2.21  Sodium, Serum  131  Potassium, Serum 3.6  Chloride, Serum  97  CO2, Serum 23  Calcium (Total), Serum  7.4  Osmolality (calc) 273  eGFR (African American)  41  eGFR (Non-African American)  34 (eGFR values <35m/min/1.73 m2 may be an indication of chronic kidney disease (CKD). Calculated eGFR, using the MRDR Study equation, is useful in  patients with stable renal function. The eGFR calculation will not be reliable in acutely ill patients when serum creatinine is changing rapidly. It is not useful in patients on dialysis. The eGFR calculation may not be applicable to patients at the low and high extremes of body sizes, pregnant women, and vetetarians.)  Anion Gap 11  Phosphorus, Serum  2.3 (Result(s) reported on 04 Nov 2013 at 11:14AM.)   Assessment/Plan:  Assessment/Plan:   Assessment Ascites. likey worsen with stopping diuretics and patient getting IVF. For paracentesis later today. LFT slowly improving.   Plan Another week or so of tapering prednisone. Balancing both ascites and renal function, as per nephrology. I will be out tomorrrow but will check back on Wed. Thanks.   Electronic Signatures: OVerdie Shire(MD)  (Signed 19-Oct-15 12:59)  Authored: Chief Complaint, VITAL SIGNS/ANCILLARY NOTES, Brief Assessment, Lab Results, Assessment/Plan   Last Updated: 19-Oct-15 12:59 by OVerdie Shire(MD)

## 2014-05-11 NOTE — Consult Note (Signed)
Pt seen and examined. Full consult to follow. Active drinker who presents with worsening jaundice and abdominal bloating for > 1week. Some abdominal pain yesterday but none today Had U/S yesterday that confirmed moderate ascites but for some reason, paracentesis not done. Will be done later today. Abx already started yesterday. No signs of withdrawal so far. No asterixix. If paracentesis is negative for SBP, reasonable to start prednisolone daily to treat alcoholic hepatitis. After therapeutic paracentesis performed, will need to be salt and fluid restrictions, daily weight, will likely need to start diuretics, lasix/aldactone combination, to prevent recurrence of ascites. Moniter LFT daily. Will follow. Thanks.  Electronic Signatures: Lutricia Feilh, Vernica Wachtel (MD)  (Signed on 15-Oct-15 14:58)  Authored  Last Updated: 15-Oct-15 14:58 by Lutricia Feilh, Reighan Hipolito (MD)

## 2014-05-13 DIAGNOSIS — F1020 Alcohol dependence, uncomplicated: Secondary | ICD-10-CM

## 2014-05-13 DIAGNOSIS — K7031 Alcoholic cirrhosis of liver with ascites: Secondary | ICD-10-CM

## 2014-05-13 DIAGNOSIS — Z87898 Personal history of other specified conditions: Secondary | ICD-10-CM

## 2014-05-13 DIAGNOSIS — G4700 Insomnia, unspecified: Secondary | ICD-10-CM

## 2014-05-13 DIAGNOSIS — E669 Obesity, unspecified: Secondary | ICD-10-CM | POA: Insufficient documentation

## 2014-05-13 DIAGNOSIS — Z720 Tobacco use: Secondary | ICD-10-CM

## 2014-06-24 ENCOUNTER — Encounter: Payer: Self-pay | Admitting: Family Medicine

## 2014-06-24 ENCOUNTER — Ambulatory Visit (INDEPENDENT_AMBULATORY_CARE_PROVIDER_SITE_OTHER): Payer: Self-pay | Admitting: Family Medicine

## 2014-06-24 VITALS — BP 120/60 | HR 68 | Temp 97.8°F | Resp 16 | Ht 70.0 in | Wt 214.0 lb

## 2014-06-24 DIAGNOSIS — K7030 Alcoholic cirrhosis of liver without ascites: Secondary | ICD-10-CM

## 2014-06-24 NOTE — Progress Notes (Signed)
   Subjective:    Patient ID: Timothy Johns, male    DOB: 01-Sep-1965, 49 y.o.   MRN: [id number]  HPI  Alcoholic cirrhosis of liver without ascites  Patient was last seen in the late fall when he was undergoing treatment for his ascites.  Patient has not had any further episodes requiring hospitalization.  He states he feels well.  He continues with his sobriety and has no complaints today. Blood pressure 120/60, pulse 68, temperature 97.8 F (36.6 C), temperature source Oral, resp. rate 16, height 5\' 10"  (1.778 m), weight 214 lb (97.07 kg).  Patient Active Problem List   Diagnosis Date Noted  . History of syncope 05/13/2014  . Tobacco use 05/13/2014  . Alcoholic cirrhosis of liver with ascites 05/13/2014  . Alcoholism 05/13/2014  . Obesity 05/13/2014  . Insomnia 05/13/2014   No Known Allergies    Current outpatient prescriptions:  .  diphenhydrAMINE (BENADRYL) 25 mg capsule, Take 25 mg by mouth at bedtime as needed (only using about once per week for sleep)., Disp: , Rfl:  .  Multiple Vitamin (MULTIVITAMIN) tablet, Take 1 tablet by mouth daily., Disp: , Rfl:  .  thiamine 100 MG tablet, Take 100 mg by mouth daily., Disp: , Rfl:  .  Folic Acid-Vit B6-Vit B12 (FOLBEE) 2.5-25-1 MG TABS tablet, Take 1 tablet by mouth daily., Disp: , Rfl:  .  furosemide (LASIX) 20 MG tablet, Take 20 mg by mouth 2 (two) times daily., Disp: , Rfl:  .  spironolactone (ALDACTONE) 50 MG tablet, Take 50 mg by mouth 2 (two) times daily., Disp: , Rfl:  .  traZODone (DESYREL) 100 MG tablet, Take 100 mg by mouth at bedtime., Disp: , Rfl:    Review of Systems  Constitutional: Positive for activity change (Patient is now more, typically 30 minutes in the area of [facility]).  Respiratory: Negative.   Cardiovascular: Negative.  Negative for leg swelling (slight swelling in his toes.  This has been since his fall episode but feels it is not significant enough to continue with diuretics.).   Gastrointestinal: Positive for blood in stool (Nop black stool and only seen on toilet tissue and only with BMs.  He has discussed with his GI physician.).       Objective:   Physical Exam Vital signs as above. He is a well-nourished well-developed male in no acute distress. Conjunctiva normal, sclera anicteric. Cranial nerves grossly intact neurologic exam grossly nonfocal. Head and neck normal thyroid or enlargement or cervical adenopathy.  Chest and lungs clear. Abdomen soft without mass. Extremities without clubbing cyanosis or edema.         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 Patient has completely quit drinking. I have praised him for this. He last saw GI in December and had lab work then. He declines any lab work today. See him back in 6 months and repeat lab work. No changes in care today.

## 2014-12-24 ENCOUNTER — Ambulatory Visit (INDEPENDENT_AMBULATORY_CARE_PROVIDER_SITE_OTHER): Payer: Self-pay | Admitting: Family Medicine

## 2014-12-24 ENCOUNTER — Encounter: Payer: Self-pay | Admitting: Family Medicine

## 2014-12-24 VITALS — BP 158/74 | HR 72 | Temp 98.4°F | Resp 14 | Wt 235.0 lb

## 2014-12-24 DIAGNOSIS — Z23 Encounter for immunization: Secondary | ICD-10-CM

## 2014-12-24 DIAGNOSIS — E669 Obesity, unspecified: Secondary | ICD-10-CM

## 2014-12-24 DIAGNOSIS — K7031 Alcoholic cirrhosis of liver with ascites: Secondary | ICD-10-CM

## 2014-12-24 DIAGNOSIS — Z720 Tobacco use: Secondary | ICD-10-CM

## 2014-12-24 NOTE — Progress Notes (Signed)
Patient ID: Timothy Johns, male   DOB: Feb 06, 1965, 49 y.o.   MRN: [id number]    Subjective:  HPI  Patient is here for follow up. Last OV was in June 2016. He is doing well/stable he says. He is still not drinking alcohol. He is also using nicotine patches and has cut back smoking only about 3 days a week. He has not had to see GI doctor for any issues so far. He takes Spironolactone about once a month when his toes swell up but no longer takes lasix.  Prior to Admission medications   Medication Sig Start Date End Date Taking? Authorizing Provider  Folic Acid-Vit B6-Vit B12 (FOLBEE) 2.5-25-1 MG TABS tablet Take 1 tablet by mouth daily.   Yes Historical Provider, MD  Multiple Vitamin (MULTIVITAMIN) tablet Take 1 tablet by mouth daily.   Yes Historical Provider, MD  spironolactone (ALDACTONE) 50 MG tablet Take 50 mg by mouth 2 (two) times daily.   Yes Historical Provider, MD  thiamine 100 MG tablet Take 100 mg by mouth daily.   Yes Historical Provider, MD  diphenhydrAMINE (BENADRYL) 25 mg capsule Take 25 mg by mouth at bedtime as needed (only using about once per week for sleep).    Historical Provider, MD    Patient Active Problem List   Diagnosis Date Noted  . History of syncope 05/13/2014  . Tobacco use 05/13/2014  . Alcoholic cirrhosis of liver with ascites (HCC) 05/13/2014  . Alcoholism (HCC) 05/13/2014  . Obesity 05/13/2014  . Insomnia 05/13/2014    No past medical history on file.  Social History   Social History  . Marital Status: Single    Spouse Name: N/A  . Number of Children: N/A  . Years of Education: N/A   Occupational History  . Not on file.   Social History Main Topics  . Smoking status: Current Every Day Smoker -- 1.00 packs/day for 20 years    Types: Cigarettes  . Smokeless tobacco: Never Used     Comment: plans to use patch in the future but not right now.  . Alcohol Use: No     Comment: Hx of alcohol abuse, quit 8 months ago  . Drug Use: No   Comment: Hx of marijuana use  . Sexual Activity: Not on file   Other Topics Concern  . Not on file   Social History Narrative    No Known Allergies  Review of Systems  Constitutional: Negative.   Respiratory: Negative.   Cardiovascular: Negative.   Gastrointestinal: Negative.   Musculoskeletal: Negative.   Psychiatric/Behavioral: Negative.      There is no immunization history on file for this patient. Objective:  BP 158/74 mmHg  Pulse 72  Temp(Src) 98.4 F (36.9 C)  Resp 14  Wt 235 lb (106.595 kg)  Physical Exam  Constitutional: He is oriented to person, place, and time and well-developed, well-nourished, and in no distress.  HENT:  Head: Normocephalic and atraumatic.  Eyes: Conjunctivae are normal.  Neck: Neck supple.  Cardiovascular: Normal rate, regular rhythm and normal heart sounds.   Pulmonary/Chest: Effort normal and breath sounds normal.  Abdominal: Soft. He exhibits no distension and no mass. There is no tenderness. There is no rebound.  Musculoskeletal:  Trace right lower extremity edema, none on the left.  Neurological: He is alert and oriented to person, place, and time. Gait normal.  Skin: Skin is warm and dry.  Psychiatric: Memory, affect and judgment normal.    Lab Results  Component  Value Date   WBC 15.6* 11/05/2013   HGB 12.5* 11/05/2013   HCT 37.4* 11/05/2013   PLT 227 11/05/2013   GLUCOSE 111* 11/06/2013   TSH 7.88* 10/30/2013   INR 1.3 10/31/2013    CMP     Component Value Date/Time   NA 136* 12/03/2013   NA 137 11/06/2013 1151   K 4.7 12/03/2013   K 3.4* 11/06/2013 1151   CL 105 11/06/2013 1151   CO2 23 11/06/2013 1151   GLUCOSE 111* 11/06/2013 1151   BUN 21 12/03/2013   BUN 47* 11/06/2013 1151   CREATININE 1.6* 12/03/2013   CREATININE 1.89* 11/06/2013 1151   CALCIUM 7.7* 11/06/2013 1151   PROT 4.9* 11/06/2013 1151   ALBUMIN 2.8* 11/06/2013 1151   AST 121* 11/06/2013 1151   ALT 92* 11/06/2013 1151   ALKPHOS 146*  12/03/2013   ALKPHOS 233* 11/06/2013 1151   BILITOT 20.5* 11/06/2013 1151   GFRNONAA 41* 11/06/2013 1151   GFRAA 49* 11/06/2013 1151    Assessment and Plan :  1. Alcoholic cirrhosis of liver with ascites (HCC) Stable. Needs to have labs repeated but patient declines again. He is still not drinking alcohol. Must have labs on next OV. 2. Tobacco use Patient encouraged to quit, he has cut back.  3. Obesity Patient gained 20 lbs since last OV in June. Discussed working on habits. 4. Hypertension Asian is agreeable to restart Aldactone so we'll do that at one pill every morning. we'll see him back in 1-2 months. Will insist on lab work then. We'll probably increase Aldactone to twice a day depending on blood pressure. I am pleased he is done as well as he has with his cirrhosis.  Patient was seen and examined by Dr. Bosie Closichard L Gilbert and note was scribed by Samara DeistAnastasiya Aleksandrova, RMA.    Julieanne Mansonichard Gilbert MD [facility] 12/24/2014 4:09 PM

## 2014-12-25 DIAGNOSIS — Z23 Encounter for immunization: Secondary | ICD-10-CM

## 2015-01-22 IMAGING — US US GUIDE NEEDLE - US PARA
1 series · 9 of 9 positions shown · non-contrast
Comparison: 11/19/2013

CLINICAL DATA: Alcoholic cirrhosis refractory ascites.

EXAM:
ULTRASOUND GUIDED  PARACENTESIS

[Series 1: us guide needle - us para · 0.23mm/px · 9 of 9 slices shown]
[im 1/9]
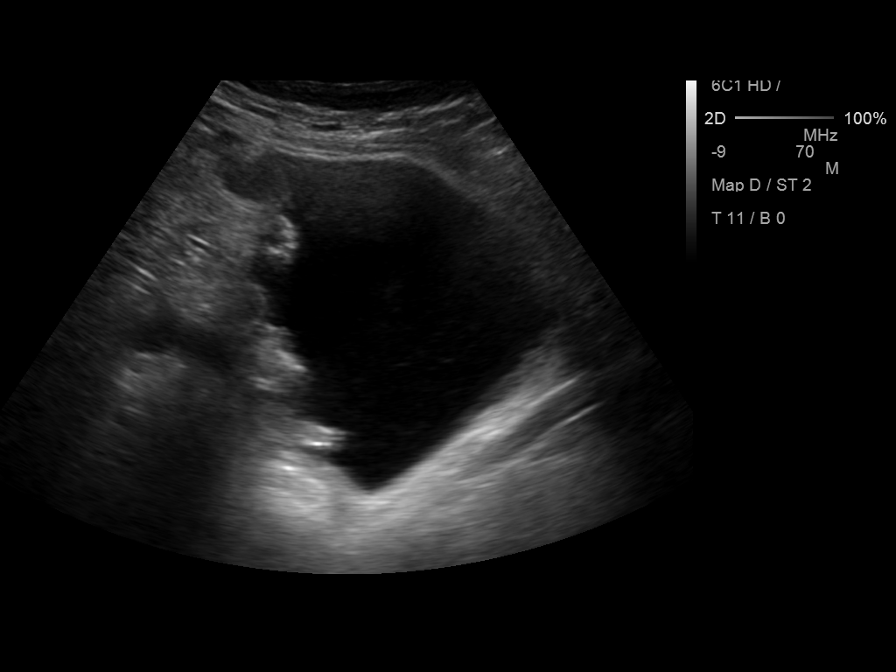
[im 2/9]
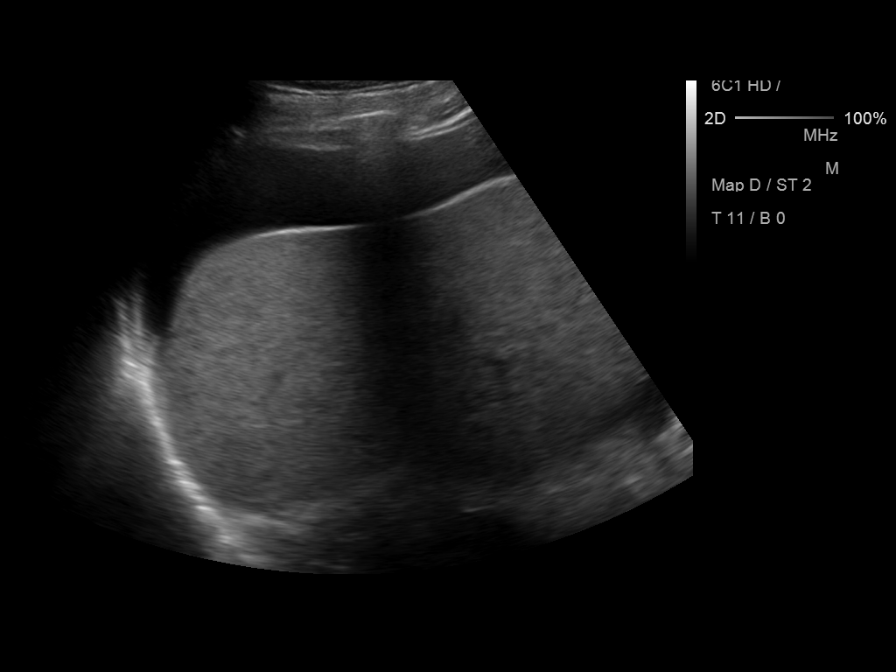
[im 3/9]
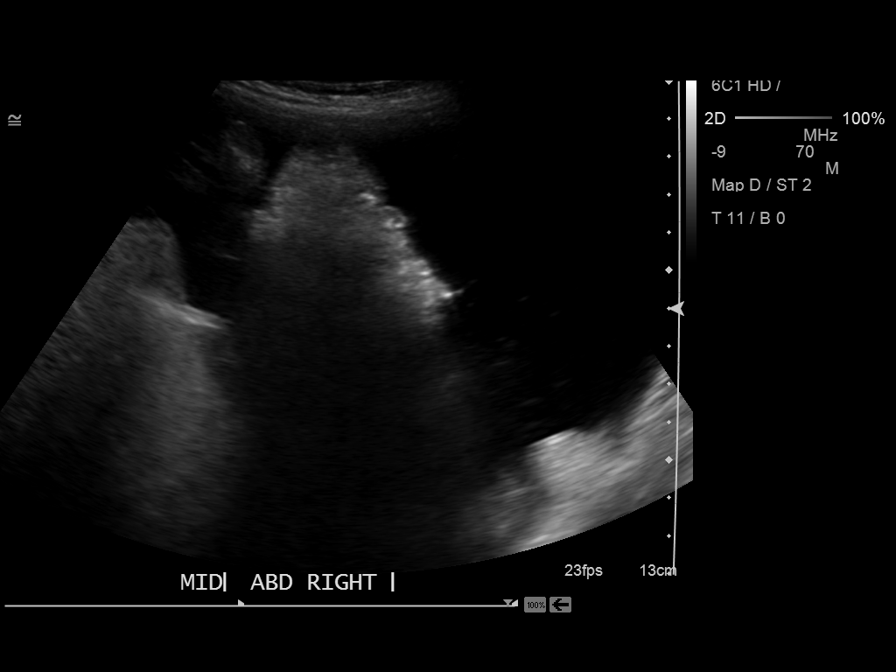
[im 4/9]
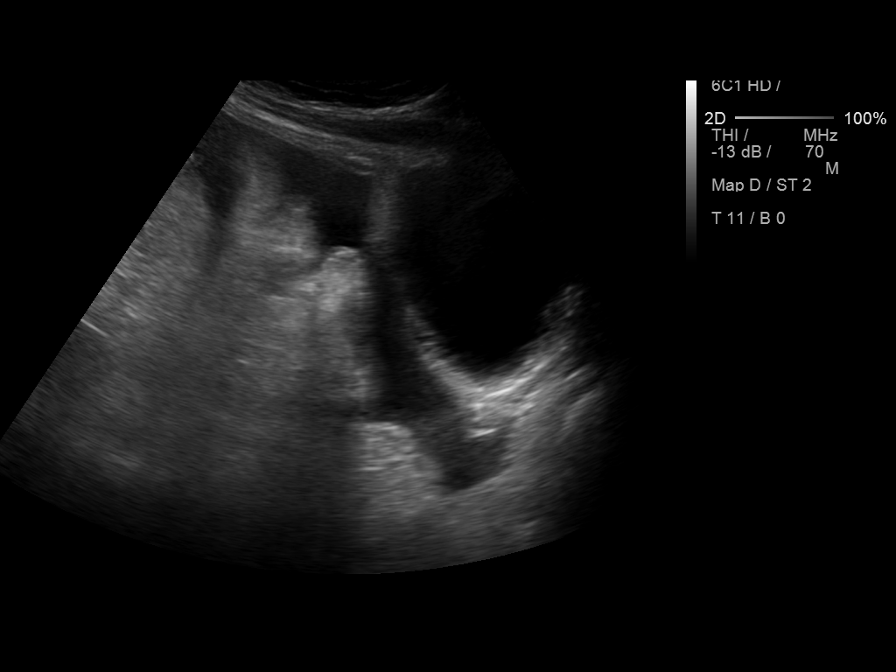
[im 5/9]
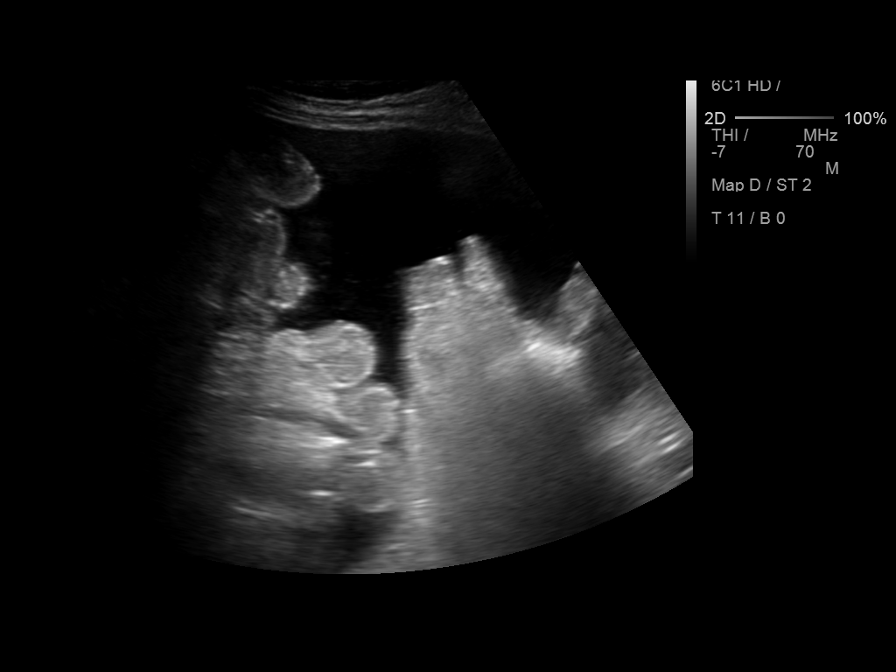
[im 6/9]
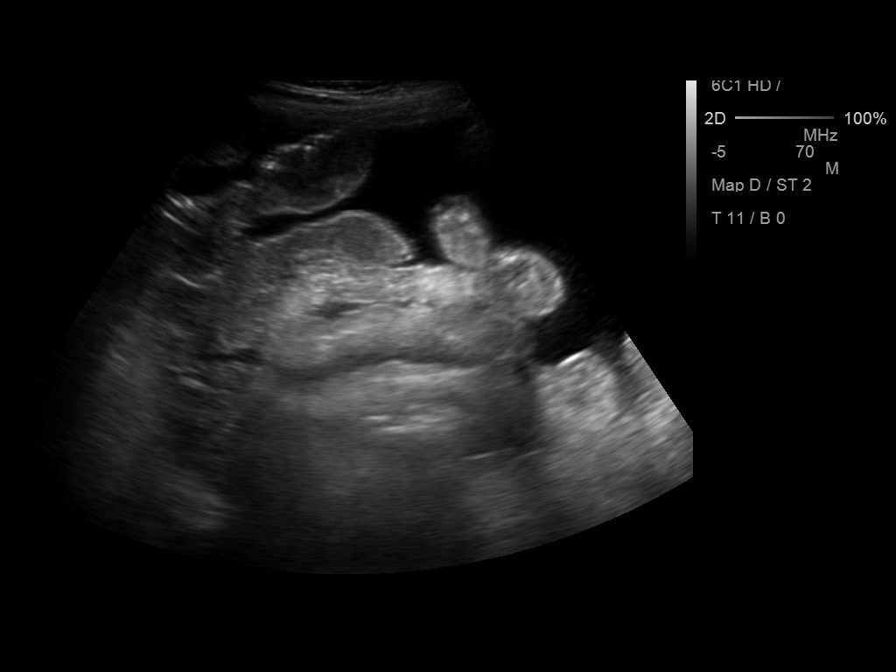
[im 7/9]
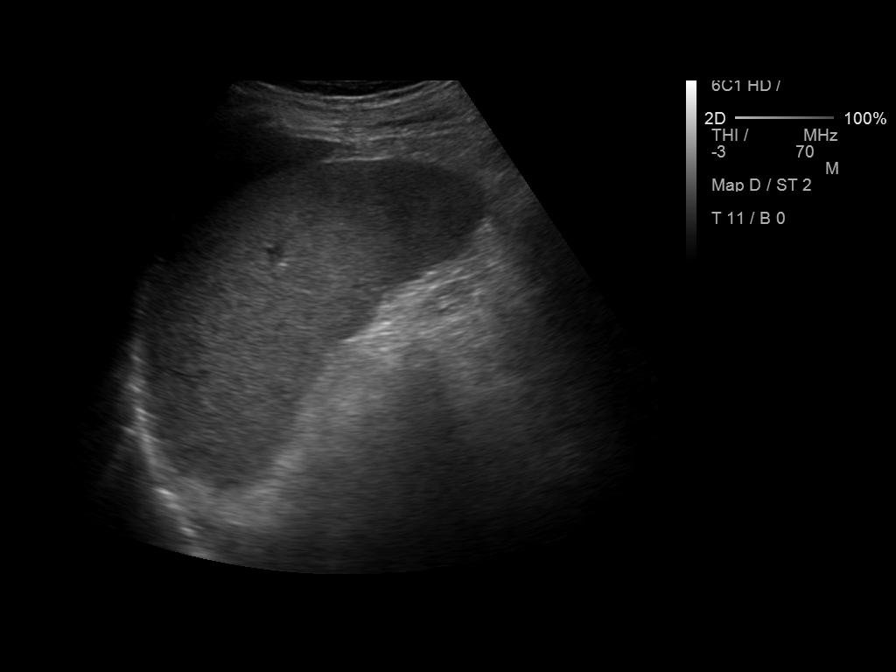
[im 8/9]
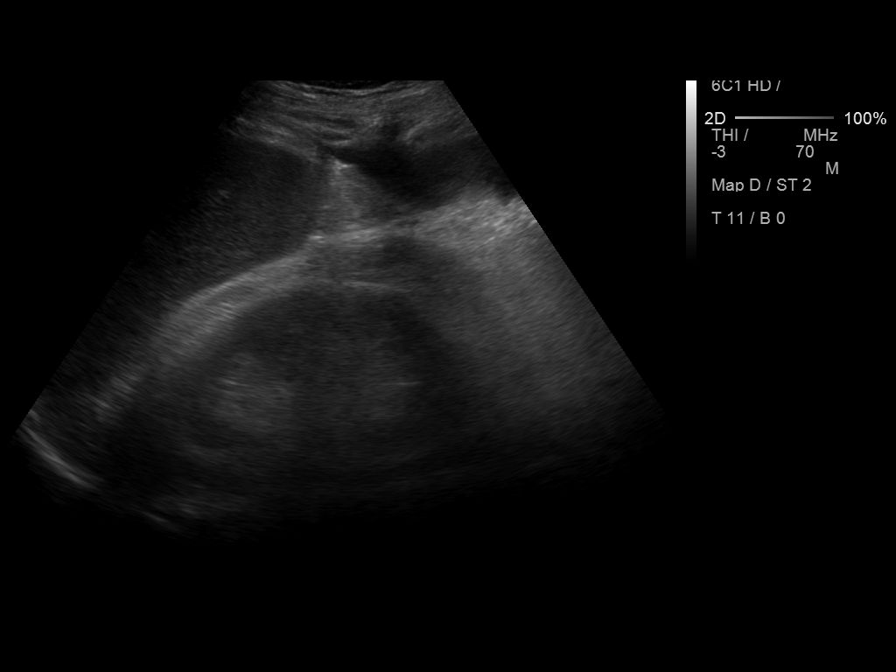
[im 9/9]
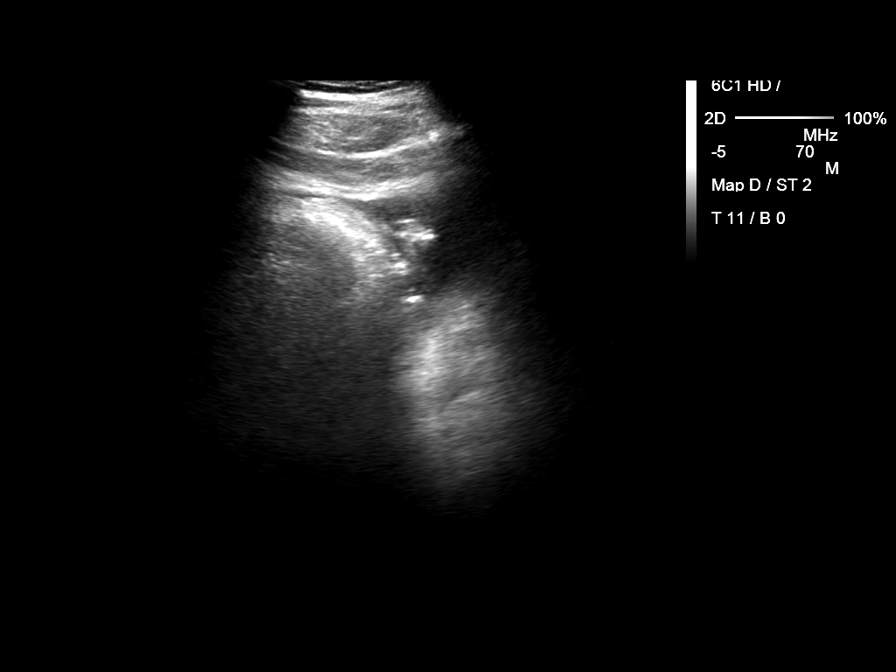

[9 of 9 positions shown; findings below may reference images not displayed]

PROCEDURE:
An ultrasound guided paracentesis was thoroughly discussed with the
patient and questions answered. The benefits, risks, alternatives
and complications were also discussed. The patient understands and
wishes to proceed with the procedure. Written consent was obtained.

Ultrasound was performed to localize and mark an adequate pocket of
fluid in the right lower quadrant of the abdomen. The area was then
prepped and draped in the normal sterile fashion. 1% Lidocaine was
used for local anesthesia. Under ultrasound guidance a 6 French
Safe-T-Centesis catheter was introduced. Paracentesis was performed.
The catheter was removed and a dressing applied.

COMPLICATIONS:
None
FINDINGS: A total of approximately 5.2 L of clear, yellow fluid was removed.
IMPRESSION: Successful ultrasound guided paracentesis yielding 5.2 L of ascites.

## 2015-03-25 ENCOUNTER — Encounter: Payer: Self-pay | Admitting: Family Medicine

## 2015-03-25 ENCOUNTER — Ambulatory Visit (INDEPENDENT_AMBULATORY_CARE_PROVIDER_SITE_OTHER): Payer: Self-pay | Admitting: Family Medicine

## 2015-03-25 VITALS — BP 138/82 | HR 82 | Temp 97.4°F | Resp 16 | Wt 233.0 lb

## 2015-03-25 DIAGNOSIS — I10 Essential (primary) hypertension: Secondary | ICD-10-CM

## 2015-03-25 DIAGNOSIS — K7031 Alcoholic cirrhosis of liver with ascites: Secondary | ICD-10-CM

## 2015-03-25 NOTE — Progress Notes (Signed)
Patient ID: Timothy Johns, male   DOB: 03/08/1965, 50 y.o.   MRN: [id number]    Subjective:  HPI  Patient is here for 3 months follow up.  Hypertension: Patient was to start taking Spironolactone due to elevated B/P and he did take this 1 daily and when swelling in his toes resolved he has not been taking medication regularly after that. He checked his B/P about 2 weeks ago but he does not recall the reading but states it was good. BP Readings from Last 3 Encounters:  03/25/15 138/82  12/24/14 158/74  06/24/14 120/60   He is still not drinking.  Prior to Admission medications   Medication Sig Start Date End Date Taking? Authorizing Provider  diphenhydrAMINE (BENADRYL) 25 mg capsule Take 25 mg by mouth at bedtime as needed (only using about once per week for sleep).   Yes Historical Provider, MD  Folic Acid-Vit B6-Vit B12 (FOLBEE) 2.5-25-1 MG TABS tablet Take 1 tablet by mouth daily.   Yes Historical Provider, MD  Multiple Vitamin (MULTIVITAMIN) tablet Take 1 tablet by mouth daily.   Yes Historical Provider, MD  spironolactone (ALDACTONE) 50 MG tablet Take 50 mg by mouth 2 (two) times daily.   Yes Historical Provider, MD  thiamine 100 MG tablet Take 100 mg by mouth daily.   Yes Historical Provider, MD    Patient Active Problem List   Diagnosis Date Noted  . History of syncope 05/13/2014  . Tobacco use 05/13/2014  . Alcoholic cirrhosis of liver with ascites (HCC) 05/13/2014  . Alcoholism (HCC) 05/13/2014  . Obesity 05/13/2014  . Insomnia 05/13/2014    No past medical history on file.  Social History   Social History  . Marital Status: Single    Spouse Name: N/A  . Number of Children: N/A  . Years of Education: N/A   Occupational History  . Not on file.   Social History Main Topics  . Smoking status: Current Some Day Smoker -- 0.50 packs/day for 20 years    Types: Cigarettes  . Smokeless tobacco: Never Used     Comment: plans to use patch in the future but not right  now.  . Alcohol Use: No     Comment: Hx of alcohol abuse, quit 8 months ago  . Drug Use: No     Comment: Hx of marijuana use  . Sexual Activity: Not on file   Other Topics Concern  . Not on file   Social History Narrative    No Known Allergies  Review of Systems  Constitutional: Negative.   Respiratory: Negative.   Cardiovascular: Negative.   Gastrointestinal: Negative.   Musculoskeletal: Negative.     Immunization History  Administered Date(s) Administered  . Influenza,inj,Quad PF,36+ Mos 12/25/2014   Objective:  BP 138/82 mmHg  Pulse 82  Temp(Src) 97.4 F (36.3 C)  Resp 16  Wt 233 lb (105.688 kg)  Physical Exam  Constitutional: He is oriented to person, place, and time and well-developed, well-nourished, and in no distress.  HENT:  Head: Normocephalic and atraumatic.  Right Ear: External ear normal.  Left Ear: External ear normal.  Nose: Nose normal.  Eyes: Conjunctivae are normal.  Neck: Neck supple.  Cardiovascular: Normal rate, regular rhythm and normal heart sounds.   Pulmonary/Chest: Effort normal and breath sounds normal.  Abdominal: Soft. He exhibits no distension and no mass. There is no tenderness.  Musculoskeletal: He exhibits edema.  Trace lower extremity edema below the knees  Neurological: He is alert and  oriented to person, place, and time.  Skin: Skin is warm and dry.  Psychiatric: Mood, memory, affect and judgment normal.    Lab Results  Component Value Date   WBC 15.6* 11/05/2013   HGB 12.5* 11/05/2013   HCT 37.4* 11/05/2013   PLT 227 11/05/2013   GLUCOSE 111* 11/06/2013   TSH 7.88* 10/30/2013   INR 1.3 10/31/2013    CMP     Component Value Date/Time   NA 136* 12/03/2013   NA 137 11/06/2013 1151   K 4.7 12/03/2013   K 3.4* 11/06/2013 1151   CL 105 11/06/2013 1151   CO2 23 11/06/2013 1151   GLUCOSE 111* 11/06/2013 1151   BUN 21 12/03/2013   BUN 47* 11/06/2013 1151   CREATININE 1.6* 12/03/2013   CREATININE 1.89* 11/06/2013  1151   CALCIUM 7.7* 11/06/2013 1151   PROT 4.9* 11/06/2013 1151   ALBUMIN 2.8* 11/06/2013 1151   AST 121* 11/06/2013 1151   ALT 92* 11/06/2013 1151   ALKPHOS 146* 12/03/2013   ALKPHOS 233* 11/06/2013 1151   BILITOT 20.5* 11/06/2013 1151   GFRNONAA 41* 11/06/2013 1151   GFRAA 49* 11/06/2013 1151    Assessment and Plan :  1. Alcoholic cirrhosis of liver with ascites (HCC) . His father helps support him. This information is from his father. He has evidently quit drinking alcohol also he may have a chance to do this.Due to no insurance patient is unwilling to see GI for follow-up. Have told him he must do at least a metabolic panel today.he is also unwilling to take regular medications now  that he feels better. 2. Essential hypertension Better. 3. Tobacco abuse Patient device to try to quit. I have done the exam and reviewed the above chart and it is accurate to the best of my knowledge.  Julieanne Mansonichard Gilbert MD [facility] 03/25/2015 4:18 PM

## 2015-03-26 LAB — COMPREHENSIVE METABOLIC PANEL
ALBUMIN: 4.5 g/dL (ref 3.5–5.5)
ALT: 89 IU/L — AB (ref 0–44)
AST: 52 IU/L — ABNORMAL HIGH (ref 0–40)
Albumin/Globulin Ratio: 1.5 (ref 1.1–2.5)
Alkaline Phosphatase: 143 IU/L — ABNORMAL HIGH (ref 39–117)
BUN / CREAT RATIO: 28 — AB (ref 9–20)
BUN: 26 mg/dL — AB (ref 6–24)
Bilirubin Total: 0.8 mg/dL (ref 0.0–1.2)
CALCIUM: 10.2 mg/dL (ref 8.7–10.2)
CO2: 19 mmol/L (ref 18–29)
CREATININE: 0.94 mg/dL (ref 0.76–1.27)
Chloride: 102 mmol/L (ref 96–106)
GFR, EST AFRICAN AMERICAN: 110 mL/min/{1.73_m2} (ref >59)
GFR, EST NON AFRICAN AMERICAN: 95 mL/min/{1.73_m2} (ref >59)
GLUCOSE: 104 mg/dL — AB (ref 65–99)
Globulin, Total: 3 g/dL (ref 1.5–4.5)
Potassium: 4.8 mmol/L (ref 3.5–5.2)
Sodium: 140 mmol/L (ref 134–144)
TOTAL PROTEIN: 7.5 g/dL (ref 6.0–8.5)

## 2015-05-27 ENCOUNTER — Inpatient Hospital Stay: Payer: Self-pay

## 2015-05-27 ENCOUNTER — Emergency Department: Payer: Self-pay

## 2015-05-27 ENCOUNTER — Inpatient Hospital Stay
Admission: EM | Admit: 2015-05-27 | Discharge: 2015-05-31 | DRG: 417 | Disposition: A | Discharge status: Home / Self Care | Payer: Self-pay | Attending: Internal Medicine | Admitting: Internal Medicine

## 2015-05-27 ENCOUNTER — Encounter: Payer: Self-pay | Admitting: Emergency Medicine

## 2015-05-27 DIAGNOSIS — K8012 Calculus of gallbladder with acute and chronic cholecystitis without obstruction: Principal | ICD-10-CM | POA: Diagnosis present

## 2015-05-27 DIAGNOSIS — E669 Obesity, unspecified: Secondary | ICD-10-CM | POA: Diagnosis present

## 2015-05-27 DIAGNOSIS — K7031 Alcoholic cirrhosis of liver with ascites: Secondary | ICD-10-CM | POA: Diagnosis present

## 2015-05-27 DIAGNOSIS — K660 Peritoneal adhesions (postprocedural) (postinfection): Secondary | ICD-10-CM | POA: Diagnosis present

## 2015-05-27 DIAGNOSIS — K7460 Unspecified cirrhosis of liver: Secondary | ICD-10-CM

## 2015-05-27 DIAGNOSIS — K851 Biliary acute pancreatitis without necrosis or infection: Secondary | ICD-10-CM | POA: Diagnosis present

## 2015-05-27 DIAGNOSIS — E860 Dehydration: Secondary | ICD-10-CM

## 2015-05-27 DIAGNOSIS — Z6834 Body mass index (BMI) 34.0-34.9, adult: Secondary | ICD-10-CM

## 2015-05-27 DIAGNOSIS — I10 Essential (primary) hypertension: Secondary | ICD-10-CM | POA: Diagnosis present

## 2015-05-27 DIAGNOSIS — Z79899 Other long term (current) drug therapy: Secondary | ICD-10-CM

## 2015-05-27 DIAGNOSIS — R109 Unspecified abdominal pain: Secondary | ICD-10-CM

## 2015-05-27 DIAGNOSIS — K819 Cholecystitis, unspecified: Secondary | ICD-10-CM

## 2015-05-27 DIAGNOSIS — F17210 Nicotine dependence, cigarettes, uncomplicated: Secondary | ICD-10-CM | POA: Diagnosis present

## 2015-05-27 DIAGNOSIS — K828 Other specified diseases of gallbladder: Secondary | ICD-10-CM | POA: Diagnosis present

## 2015-05-27 DIAGNOSIS — R1011 Right upper quadrant pain: Secondary | ICD-10-CM

## 2015-05-27 DIAGNOSIS — D72829 Elevated white blood cell count, unspecified: Secondary | ICD-10-CM

## 2015-05-27 DIAGNOSIS — Z8249 Family history of ischemic heart disease and other diseases of the circulatory system: Secondary | ICD-10-CM

## 2015-05-27 DIAGNOSIS — Z716 Tobacco abuse counseling: Secondary | ICD-10-CM

## 2015-05-27 DIAGNOSIS — Z833 Family history of diabetes mellitus: Secondary | ICD-10-CM

## 2015-05-27 DIAGNOSIS — Z8261 Family history of arthritis: Secondary | ICD-10-CM

## 2015-05-27 DIAGNOSIS — K812 Acute cholecystitis with chronic cholecystitis: Secondary | ICD-10-CM

## 2015-05-27 DIAGNOSIS — K859 Acute pancreatitis without necrosis or infection, unspecified: Secondary | ICD-10-CM | POA: Diagnosis present

## 2015-05-27 DIAGNOSIS — K8020 Calculus of gallbladder without cholecystitis without obstruction: Secondary | ICD-10-CM

## 2015-05-27 HISTORY — DX: Unspecified cirrhosis of liver: K74.60

## 2015-05-27 HISTORY — DX: Essential (primary) hypertension: I10

## 2015-05-27 LAB — AMYLASE: Amylase: 1196 U/L — ABNORMAL HIGH (ref 28–100)

## 2015-05-27 LAB — URINALYSIS COMPLETE WITH MICROSCOPIC (ARMC ONLY)
BACTERIA UA: NONE SEEN
Bilirubin Urine: NEGATIVE
GLUCOSE, UA: NEGATIVE mg/dL
Hgb urine dipstick: NEGATIVE
Leukocytes, UA: NEGATIVE
NITRITE: NEGATIVE
PROTEIN: 100 mg/dL — AB
Specific Gravity, Urine: 1.033 — ABNORMAL HIGH (ref 1.005–1.030)
pH: 5 (ref 5.0–8.0)

## 2015-05-27 LAB — CBC
HEMATOCRIT: 43.1 % (ref 40.0–52.0)
Hemoglobin: 14.8 g/dL (ref 13.0–18.0)
MCH: 32.4 pg (ref 26.0–34.0)
MCHC: 34.4 g/dL (ref 32.0–36.0)
MCV: 94.2 fL (ref 80.0–100.0)
PLATELETS: 223 10*3/uL (ref 150–440)
RBC: 4.57 MIL/uL (ref 4.40–5.90)
RDW: 15.4 % — AB (ref 11.5–14.5)
WBC: 18.2 10*3/uL — ABNORMAL HIGH (ref 3.8–10.6)

## 2015-05-27 LAB — COMPREHENSIVE METABOLIC PANEL
ALBUMIN: 4.5 g/dL (ref 3.5–5.0)
ALK PHOS: 95 U/L (ref 38–126)
ALT: 38 U/L (ref 17–63)
AST: 35 U/L (ref 15–41)
Anion gap: 11 (ref 5–15)
BILIRUBIN TOTAL: 2.3 mg/dL — AB (ref 0.3–1.2)
BUN: 27 mg/dL — AB (ref 6–20)
CALCIUM: 10.1 mg/dL (ref 8.9–10.3)
CO2: 25 mmol/L (ref 22–32)
CREATININE: 1.11 mg/dL (ref 0.61–1.24)
Chloride: 100 mmol/L — ABNORMAL LOW (ref 101–111)
GFR calc Af Amer: >60 mL/min (ref >60)
GFR calc non Af Amer: >60 mL/min (ref >60)
GLUCOSE: 168 mg/dL — AB (ref 65–99)
Potassium: 3.9 mmol/L (ref 3.5–5.1)
Sodium: 136 mmol/L (ref 135–145)
TOTAL PROTEIN: 8.1 g/dL (ref 6.5–8.1)

## 2015-05-27 LAB — BILIRUBIN, FRACTIONATED(TOT/DIR/INDIR)
BILIRUBIN DIRECT: 0.6 mg/dL — AB (ref 0.1–0.5)
BILIRUBIN INDIRECT: 1.6 mg/dL — AB (ref 0.3–0.9)
BILIRUBIN TOTAL: 2.2 mg/dL — AB (ref 0.3–1.2)

## 2015-05-27 LAB — LIPASE, BLOOD: Lipase: 1825 U/L — ABNORMAL HIGH (ref 11–51)

## 2015-05-27 LAB — PROTIME-INR
INR: 1.08
PROTHROMBIN TIME: 14.2 s (ref 11.4–15.0)

## 2015-05-27 LAB — MAGNESIUM: MAGNESIUM: 1.6 mg/dL — AB (ref 1.7–2.4)

## 2015-05-27 LAB — AMMONIA: Ammonia: 15 umol/L (ref 9–35)

## 2015-05-27 LAB — APTT: aPTT: 29 seconds (ref 24–36)

## 2015-05-27 MED ORDER — MORPHINE SULFATE (PF) 4 MG/ML IV SOLN
4.0000 mg | INTRAVENOUS | Status: DC | PRN
  Administered 2015-05-27 – 2015-05-28 (×10): 4 mg via INTRAVENOUS
  Filled 2015-05-27 (×11): qty 1

## 2015-05-27 MED ORDER — ONDANSETRON HCL 4 MG PO TABS: 4.0000 mg | ORAL_TABLET | Freq: Four times a day (QID) | ORAL | Status: DC | PRN

## 2015-05-27 MED ORDER — ALBUTEROL SULFATE (2.5 MG/3ML) 0.083% IN NEBU: 2.5000 mg | INHALATION_SOLUTION | RESPIRATORY_TRACT | Status: DC | PRN

## 2015-05-27 MED ORDER — ADULT MULTIVITAMIN W/MINERALS CH
1.0000 | ORAL_TABLET | Freq: Every day | ORAL | Status: DC
  Administered 2015-05-28 – 2015-05-31 (×4): 1 via ORAL
  Filled 2015-05-27 (×4): qty 1

## 2015-05-27 MED ORDER — SODIUM CHLORIDE 0.9 % IV BOLUS (SEPSIS)
1000.0000 mL | Freq: Once | INTRAVENOUS | Status: AC
  Administered 2015-05-27: 1000 mL via INTRAVENOUS

## 2015-05-27 MED ORDER — OXYCODONE HCL 5 MG PO TABS
5.0000 mg | ORAL_TABLET | ORAL | Status: DC | PRN
  Administered 2015-05-29 (×2): 5 mg via ORAL
  Filled 2015-05-27 (×2): qty 1

## 2015-05-27 MED ORDER — MORPHINE SULFATE (PF) 4 MG/ML IV SOLN
4.0000 mg | Freq: Once | INTRAVENOUS | Status: AC
  Administered 2015-05-27: 4 mg via INTRAVENOUS
  Filled 2015-05-27: qty 1

## 2015-05-27 MED ORDER — ALBUMIN HUMAN 25 % IV SOLN
12.5000 g | Freq: Once | INTRAVENOUS | Status: AC
  Administered 2015-05-27: 18:00:00 12.5 g via INTRAVENOUS
  Filled 2015-05-27 (×2): qty 50

## 2015-05-27 MED ORDER — DIPHENHYDRAMINE HCL 25 MG PO CAPS
25.0000 mg | ORAL_CAPSULE | Freq: Every evening | ORAL | Status: DC | PRN
  Administered 2015-05-29: 25 mg via ORAL
  Filled 2015-05-27: qty 1

## 2015-05-27 MED ORDER — NICOTINE 7 MG/24HR TD PT24
7.0000 mg | MEDICATED_PATCH | Freq: Every day | TRANSDERMAL | Status: DC
  Administered 2015-05-27 – 2015-05-31 (×5): 7 mg via TRANSDERMAL
  Filled 2015-05-27 (×6): qty 1

## 2015-05-27 MED ORDER — VITAMIN B-1 100 MG PO TABS
100.0000 mg | ORAL_TABLET | Freq: Every day | ORAL | Status: DC
  Administered 2015-05-28 – 2015-05-31 (×4): 100 mg via ORAL
  Filled 2015-05-27 (×4): qty 1

## 2015-05-27 MED ORDER — DIATRIZOATE MEGLUMINE & SODIUM 66-10 % PO SOLN
15.0000 mL | ORAL | Status: AC
  Administered 2015-05-27 (×2): 15 mL via ORAL

## 2015-05-27 MED ORDER — ENOXAPARIN SODIUM 40 MG/0.4ML ~~LOC~~ SOLN
40.0000 mg | SUBCUTANEOUS | Status: DC
  Administered 2015-05-27 – 2015-05-28 (×2): 40 mg via SUBCUTANEOUS
  Filled 2015-05-27 (×2): qty 0.4

## 2015-05-27 MED ORDER — ONDANSETRON HCL 4 MG/2ML IJ SOLN: 4.0000 mg | Freq: Four times a day (QID) | INTRAMUSCULAR | Status: DC | PRN

## 2015-05-27 MED ORDER — IOPAMIDOL (ISOVUE-300) INJECTION 61%
100.0000 mL | Freq: Once | INTRAVENOUS | Status: AC | PRN
  Administered 2015-05-27: 19:00:00 100 mL via INTRAVENOUS

## 2015-05-27 MED ORDER — ONDANSETRON HCL 4 MG/2ML IJ SOLN
4.0000 mg | Freq: Once | INTRAMUSCULAR | Status: AC
  Administered 2015-05-27: 4 mg via INTRAVENOUS
  Filled 2015-05-27: qty 2

## 2015-05-27 MED ORDER — MAGNESIUM SULFATE 2 GM/50ML IV SOLN
2.0000 g | Freq: Once | INTRAVENOUS | Status: AC
  Administered 2015-05-27: 2 g via INTRAVENOUS
  Filled 2015-05-27: qty 50

## 2015-05-27 MED ORDER — SODIUM CHLORIDE 0.9 % IV SOLN
INTRAVENOUS | Status: DC
Start: 2015-05-27 — End: 2015-05-28
  Administered 2015-05-27 – 2015-05-28 (×3): via INTRAVENOUS

## 2015-05-27 NOTE — Consult Note (Signed)
Patient ID: Timothy Johns, male   DOB: 09/08/1965, 50 y.o.   MRN: [id number]  HPI Timothy Johns is a 50 y.o. male seen in consultation for pancreatitis. Note the patient had a history of alcoholic cirrhosis but quit alcohol but a year and half ago. He has since the episode of decompensated cirrhosis about a year and a half ago improved from the hepatic perspective. He is not getting any paracentesis and the last paracentesis was about a year and a half ago. He now presents to the emergency room complaining of epigastric and right upper quadrant pain that is started yesterday afternoon. He reports that over the weekend he ate a lot of fatty meal. Pain is severe, intermittent and dull in nature. No specific aggravating factors and the pain is improved by narcotics. Some nausea. The patient has good cardiovascular reserve and is able to perform more than 4 Mets of activity. 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 Both of his parents had gallbladder disease Heart the workup has included a lipase that was significantly elevated greater than a thousand, at total bilirubin of 2.3 and a white count of 18,000. Ultrasound personally reviewed there is evidence of some cirrhosis, there is no ascites, normal common bile duct, all bladder with stones and sludge but no evidence of pericholecystic fluid and no evidence of cholecystitis.  HPI  Past Medical History  Diagnosis Date  . Cirrhosis (HCC)     History reviewed. No pertinent past surgical history.  Family History  Problem Relation Age of Onset  . Heart disease Mother   . Vaginal cancer Mother   . Arthritis Father   . Diabetes Brother   . Heart disease Maternal Grandfather     dx in her 1890s    Social History Social History  Substance Use Topics  . Smoking status: Current Some Day Smoker -- 0.50 packs/day for 20 years    Types: Cigarettes  . Smokeless tobacco: Never Used     Comment: plans to use patch in the future but not right  now.  . Alcohol Use: No     Comment: Hx of alcohol abuse, quit 8 months ago    Allergies  Allergen Reactions  . Tylenol [Acetaminophen] Other (See Comments)    Pt. states PCP told him not to take because of his liver.    Current Facility-Administered Medications  Medication Dose Route Frequency Provider Last Rate Last Dose  . 0.9 %  sodium chloride infusion   Intravenous Continuous Leafy Roiego F Pabon, MD 125 mL/hr at 05/27/15 1613    . albumin human 25 % solution 12.5 g  12.5 g Intravenous Once Leafy Roiego F Pabon, MD      . albuterol (PROVENTIL) (2.5 MG/3ML) 0.083% nebulizer solution 2.5 mg  2.5 mg Nebulization Q2H PRN Shaune PollackQing Chen, MD      . diphenhydrAMINE (BENADRYL) capsule 25 mg  25 mg Oral QHS PRN Shaune PollackQing Chen, MD      . enoxaparin (LOVENOX) injection 40 mg  40 mg Subcutaneous Q24H Shaune PollackQing Chen, MD   40 mg at 05/27/15 1137  . magnesium sulfate IVPB 2 g 50 mL  2 g Intravenous Once Shaune PollackQing Chen, MD   2 g at 05/27/15 1610  . morphine 4 MG/ML injection 4 mg  4 mg Intravenous Q3H PRN Shaune PollackQing Chen, MD   4 mg at 05/27/15 1507  . multivitamin with minerals tablet 1 tablet  1 tablet Oral Daily Shaune PollackQing Chen, MD   1 tablet  at 05/27/15 1133  . nicotine (NICODERM CQ - dosed in mg/24 hr) patch 7 mg  7 mg Transdermal Daily Shaune Pollack, MD   7 mg at 05/27/15 1315  . ondansetron (ZOFRAN) tablet 4 mg  4 mg Oral Q6H PRN Shaune Pollack, MD       Or  . ondansetron [facility]) injection 4 mg  4 mg Intravenous Q6H PRN Shaune Pollack, MD      . oxyCODONE (Oxy IR/ROXICODONE) immediate release tablet 5 mg  5 mg Oral Q4H PRN Shaune Pollack, MD      . thiamine (VITAMIN B-1) tablet 100 mg  100 mg Oral Daily Shaune Pollack, MD   100 mg at 05/27/15 1133     Review of Systems A 10 point review of systems was asked and was negative except for the information on the HPI  Physical Exam Blood pressure 145/67, pulse 62, temperature 97.8 F (36.6 C), temperature source Oral, resp. rate 22, height  (1.778 m), weight 108.863 kg (240 lb), SpO2 96  %. CONSTITUTIONAL: No acute distress, in good spirits EYES: Pupils are equal, round, and reactive to light, Sclera are non-icteric. EARS, NOSE, MOUTH AND THROAT: The oropharynx is clear. The oral mucosa is pink and moist. Hearing is intact to voice. LYMPH NODES:  Lymph nodes in the neck are normal. RESPIRATORY:  Lungs are clear. There is normal respiratory effort, with equal breath sounds bilaterally, and without pathologic use of accessory muscles. CARDIOVASCULAR: Heart is regular without murmurs, gallops, or rubs. GI: The abdomen is  soft, tenderness to palpation in the right upper quadrant. No peritonitis no Murphy sign. There are no palpable masses. There is no hepatosplenomegaly. There are normal bowel sounds in all quadrants. GU: Rectal deferred.   MUSCULOSKELETAL: Normal muscle strength and tone. No cyanosis or edema.   SKIN: Turgor is good and there are no pathologic skin lesions or ulcers. NEUROLOGIC: Motor and sensation is grossly normal. Cranial nerves are grossly intact. PSYCH:  Oriented to person, place and time. Affect is normal.  Data Reviewed I have personally reviewed the patient's imaging, laboratory findings and medical records.    Assessment/ Plan 50 year old male with a history of alcoholic cirrhosis now with a child's a and with significant improvement of his 0 function over the last year at least clinically. Percent with pancreatitis the question is about the etiology of the pancreatitis. Obviously alcoholic versus gallstone pancreatitis is in consideration and he certainly can have either one of them. I am going to order some coags, LFTs and INR 2 assess in more detail his 0 function. I will also obtain a CT scan of the abdomen and pelvis to delineate the pancreatic process and also I'll look at his liver to make sure there is no evidence of any other disease. No need for any immediate surgical procedure at this time. We will continue to follow. Recommend to continue  resuscitation, nothing by mouth and serial abdominal exams. I have also added albumin and have increase his maintenance fluids since he looked dehydrated. Sterling Big, MD FACS General Surgeon 05/27/2015, 5:01 PM

## 2015-05-27 NOTE — ED Notes (Signed)
Pt presents to ED with sharp right upper abd pain. Pt states he has a hx of cirrhosis. dx several years ago and pt was undergoing tx for several months when symptoms appeared to resolve. Pt states he has not needed any further tx and has not had any symptoms since then. Denies ETOH use or vomiting. Home medications taken with no relief.

## 2015-05-27 NOTE — ED Provider Notes (Signed)
[facility] Emergency Department Provider Note   ____________________________________________  Time seen: Approximately 7:10 AM  I have reviewed the triage vital signs and the nursing notes.   HISTORY  Chief Complaint Abdominal Pain    HPI Timothy Johns is a 50 y.o. male who is complaining of severe right upper quadrant abdominal pain that has progressively worsened over the past 12 hours. Patient states that he has history of cirrhosis but has not been drinking in over a year. Patient states the pain feels like his "liver pain" and he made himself vomit thinking it would feel better. Patient denies any significant change in his bowels or blood in his emesis or stools. Patient denies any fever, chills, nausea, or lower extremity edema. Patient states that his abdomen has been mildly distended, but he has not had to have a paracentesis for ascites since January 2016. Patient states his pain on scale of 0-10 is a 10. Patient states the pain is nonradiating. She states he's had decreased urination and feels like he is dehydrated.   Past Medical History  Diagnosis Date  . Cirrhosis [facility](HCC)     Patient Active Problem List   Diagnosis Date Noted  . History of syncope 05/13/2014  . Tobacco use 05/13/2014  . Alcoholic cirrhosis of liver with ascites (HCC) 05/13/2014  . Alcoholism (HCC) 05/13/2014  . Obesity 05/13/2014  . Insomnia 05/13/2014    History reviewed. No pertinent past surgical history.  Current Outpatient Rx  Name  Route  Sig  Dispense  Refill  . diphenhydrAMINE (BENADRYL) 25 mg capsule   Oral   Take 25 mg by mouth at bedtime as needed for sleep.          . Multiple Vitamin (MULTIVITAMIN) tablet   Oral   Take 1 tablet by mouth daily.         Marland Kitchen. spironolactone (ALDACTONE) 50 MG tablet   Oral   Take 50 mg by mouth daily.          Marland Kitchen. thiamine 100 MG tablet   Oral   Take 100 mg by mouth daily.            Allergies Tylenol  Family History  Problem Relation Age of Onset  . Heart disease Mother   . Vaginal cancer Mother   . Arthritis Father   . Diabetes Brother   . Heart disease Maternal Grandfather     dx in her 5590s    Social History Social History  Substance Use Topics  . Smoking status: Current Some Day Smoker -- 0.50 packs/day for 20 years    Types: Cigarettes  . Smokeless tobacco: Never Used     Comment: plans to use patch in the future but not right now.  . Alcohol Use: No     Comment: Hx of alcohol abuse, quit 8 months ago    Review of Systems Constitutional: No fever/chills Eyes: No visual changes. ENT: No sore throat. Cardiovascular: Denies chest pain. Respiratory: Denies shortness of breath. Gastrointestinal: As therefore right upper quadrant abdominal pain.  Positive for nausea and vomiting 1.  No diarrhea.  No constipation. Genitourinary: Negative for dysuria, but patient states that he has had decreased urination since last night.. Musculoskeletal: Negative for back pain. Skin: Negative for rash. Neurological: Negative for headaches, focal weakness or numbness.  10-point ROS otherwise negative.  ____________________________________________   PHYSICAL EXAM:  VITAL SIGNS: ED Triage Vitals  Enc Vitals Group     BP 05/27/15 0652 161/82  mmHg     Pulse Rate 05/27/15 0652 66     Resp 05/27/15 0652 18     Temp 05/27/15 0652 97.5 F (36.4 C)     Temp Source 05/27/15 0652 Oral     SpO2 05/27/15 0652 95 %     Weight 05/27/15 0652 240 lb (108.863 kg)     Height 05/27/15 0652 5\' 10"  (1.778 m)     Head Cir --      Peak Flow --      Pain Score 05/27/15 0652 6     Pain Loc --      Pain Edu? --      Excl. in GC? --     Constitutional: Alert and oriented. Well appearing and in Mild distress secondary to his pain. Eyes: Conjunctivae are normal. PERRL. EOMI. Head: Atraumatic. Nose: No congestion/rhinnorhea. Mouth/Throat: Mucous membranes are moist.   Oropharynx non-erythematous. Neck: No stridor.   Cardiovascular: Normal rate, regular rhythm. Grossly normal heart sounds.  Good peripheral circulation. Respiratory: Normal respiratory effort.  No retractions. Lungs CTAB. Gastrointestinal: Soft and tender to palpation to his right upper quadrant and midepigastric area, with no significant rebound guarding or peritoneal signs. Patient's abdomen is mildly distended.. No abdominal bruits. No CVA tenderness. Musculoskeletal: No lower extremity tenderness nor edema.  No joint effusions. Neurologic:  Normal speech and language. No gross focal neurologic deficits are appreciated. No gait instability. Skin:  Skin is warm, dry and intact. No rash noted. Psychiatric: Mood and affect are normal. Speech and behavior are normal.  ____________________________________________   LABS (all labs ordered are listed, but only abnormal results are displayed)  Labs Reviewed  LIPASE, BLOOD - Abnormal; Notable for the following:    Lipase 1825 (*)    All other components within normal limits  COMPREHENSIVE METABOLIC PANEL - Abnormal; Notable for the following:    Chloride 100 (*)    Glucose, Bld 168 (*)    BUN 27 (*)    Total Bilirubin 2.3 (*)    All other components within normal limits  CBC - Abnormal; Notable for the following:    WBC 18.2 (*)    RDW 15.4 (*)    All other components within normal limits  AMYLASE - Abnormal; Notable for the following:    Amylase 1196 (*)    All other components within normal limits  AMMONIA  URINALYSIS COMPLETEWITH MICROSCOPIC ([facility] ONLY)   ____________________________________________  EKG none ____________________________________________  RADIOLOGY  US Abdomen Limited Ruq  05/27/2015  CLINICAL DATA:  Right upper quadrant pain for 1 day EXAM: US ABDOMEN LIMITED - RIGHT UPPER QUADRANT COMPARISON:  October 31, 2013 FINDINGS: Gallbladder: Gallbladder is contracted with calculi and sludge noted throughout the  gallbladder. Largest individual gallstone or conglomeration of gallstones measures 3.4 cm. There is no appreciable pericholecystic fluid. No sonographic Murphy sign noted by sonographer. Common bile duct: Diameter: 3 mm. No intrahepatic or extrahepatic biliary duct dilatation. Liver: No focal lesion identified. Liver echogenicity overall is increased with a somewhat nodular contour of the liver. IMPRESSION: Gallbladder is contracted and filled with sludge and calculi. No pericholecystic fluid seen. Liver contour is consistent with a degree of hepatic cirrhosis. Liver echogenicity is increased, a finding indicative of hepatic steatosis and/or underlying parenchymal disease. While no focal liver lesions are identified, it must be cautioned that the sensitivity of ultrasound for focal liver lesions is diminished significantly in this circumstance. Electronically Signed   By: Bretta Bang III M.D.   On: 05/27/2015 08:36  ____________________________________________   PROCEDURES  Procedure(s) performed: None  Critical Care performed: No  ____________________________________________   INITIAL IMPRESSION / ASSESSMENT AND PLAN / ED COURSE  Pertinent labs & imaging results that were available during my care of the patient were reviewed by me and considered in my medical decision making (see chart for details). 9:23 AM Patient will get IV fluids as well as some pain and nausea medicine while awaiting labs and ultrasound of his right upper quadrant.  9:23 AM Pt is feeling much better with IV fluids and pain meds.  Pt will be admitted to Hospitalist for acute pancreatitis and dehydration.  Pt with cirrhosis.   ____________________________________________   FINAL CLINICAL IMPRESSION(S) / ED DIAGNOSES  Final diagnoses:  Acute abdominal pain  Acute pancreatitis, unspecified complication status, unspecified pancreatitis type  Gallstones  Cirrhosis of liver without ascites, unspecified hepatic  cirrhosis type (HCC)      NEW MEDICATIONS STARTED DURING THIS VISIT:  New Prescriptions   No medications on file     Note:  This document was prepared using Dragon voice recognition software and may include unintentional dictation errors.    Leona Carry, MD 05/27/15 [phone]

## 2015-05-27 NOTE — ED Notes (Signed)
Pt states that he is unable to urinate due to pressure feeling on his R side of abdomen.

## 2015-05-27 NOTE — ED Notes (Signed)
Patient transported to Ultrasound 

## 2015-05-27 NOTE — ED Notes (Signed)
RUQ abdominal pain that began at 7PM last night after eating yogurt, intermittent. Pt alert and oriented X4, active, cooperative, pt in NAD. RR even and unlabored, color WNL.

## 2015-05-27 NOTE — H&P (Signed)
[facility] [place] [facility]   PATIENT NAME: Timothy Rolleratrick Filla    MR#:  [id number]  DATE OF BIRTH:  [birth year]  DATE OF ADMISSION:  05/27/2015  PRIMARY CARE PHYSICIAN: Megan Mansichard Gilbert Jr, MD   REQUESTING/REFERRING PHYSICIAN: Leona CarryLinda M Taylor, MD  CHIEF COMPLAINT:   Chief Complaint  Patient presents with  . Abdominal Pain   RUQ pain since last night. HISTORY OF PRESENT ILLNESS:  Timothy Rolleratrick Mihalik  is a 50 y.o. male with a known history of liver cirrhosis. He complained of severe right upper quadrant abdominal pain that has progressively worsened since last night, which is intermittent, 8-10/10, sharp without radiation. He has dark and less urine, but no fever or chills. No nausea or vomiting or diarrhea. He has not drank alcohol for 1.5 years, but ate fatty food recently.  PAST MEDICAL HISTORY:   Past Medical History  Diagnosis Date  . Cirrhosis (HCC)     PAST SURGICAL HISTORY:  History reviewed. No pertinent past surgical history.  SOCIAL HISTORY:   Social History  Substance Use Topics  . Smoking status: Current Some Day Smoker -- 0.50 packs/day for 20 years    Types: Cigarettes  . Smokeless tobacco: Never Used     Comment: plans to use patch in the future but not right now.  . Alcohol Use: No     Comment: Hx of alcohol abuse, quit 8 months ago    FAMILY HISTORY:   Family History  Problem Relation Age of Onset  . Heart disease Mother   . Vaginal cancer Mother   . Arthritis Father   . Diabetes Brother   . Heart disease Maternal Grandfather     dx in her 90s    DRUG ALLERGIES:   Allergies  Allergen Reactions  . Tylenol [Acetaminophen] Other (See Comments)    Pt. states PCP told him not to take because of his liver.    REVIEW OF SYSTEMS:  CONSTITUTIONAL: No fever, fatigue or weakness.  EYES: No blurred or double vision.  EARS, NOSE, AND THROAT: No tinnitus or ear pain.  RESPIRATORY: No cough, shortness of breath, wheezing or  hemoptysis.  CARDIOVASCULAR: No chest pain, orthopnea, edema.  GASTROINTESTINAL: No nausea, vomiting, diarrhea but has abdominal pain.  GENITOURINARY: No dysuria, hematuria.  ENDOCRINE: No polyuria, nocturia,  HEMATOLOGY: No anemia, easy bruising or bleeding SKIN: No rash or lesion. MUSCULOSKELETAL: No joint pain or arthritis.   NEUROLOGIC: No tingling, numbness, weakness.  PSYCHIATRY: No anxiety or depression.   MEDICATIONS AT HOME:   Prior to Admission medications   Medication Sig Start Date End Date Taking? Authorizing Provider  diphenhydrAMINE (BENADRYL) 25 mg capsule Take 25 mg by mouth at bedtime as needed for sleep.    Yes Historical Provider, MD  Multiple Vitamin (MULTIVITAMIN) tablet Take 1 tablet by mouth daily.   Yes Historical Provider, MD  spironolactone (ALDACTONE) 50 MG tablet Take 50 mg by mouth daily.    Yes Historical Provider, MD  thiamine 100 MG tablet Take 100 mg by mouth daily.   Yes Historical Provider, MD      VITAL SIGNS:  Blood pressure 161/76, pulse 50, temperature 98.1 F (36.7 C), temperature source Oral, resp. rate 18, height 5\' 10"  (1.778 m), weight 108.863 kg (240 lb), SpO2 98 %.  PHYSICAL EXAMINATION:  GENERAL:  50 y.o.-year-old patient lying in the bed with no acute distress. Morbid obese. EYES: Pupils equal, round, reactive to light and accommodation. No scleral icterus. Extraocular muscles intact.  HEENT: Head atraumatic, normocephalic. Oropharynx and nasopharynx clear.  NECK:  Supple, no jugular venous distention. No thyroid enlargement, no tenderness.  LUNGS: Normal breath sounds bilaterally, no wheezing, rales,rhonchi or crepitation. No use of accessory muscles of respiration.  CARDIOVASCULAR: S1, S2 normal. No murmurs, rubs, or gallops.  ABDOMEN: Soft, tenderness on RUQ, nondistended. Bowel sounds present. No organomegaly or mass.  EXTREMITIES: No pedal edema, cyanosis, or clubbing.  NEUROLOGIC: Cranial nerves II through XII are intact.  Muscle strength 5/5 in all extremities. Sensation intact. Gait not checked.  PSYCHIATRIC: The patient is alert and oriented x 3.  SKIN: No obvious rash, lesion, or ulcer.   LABORATORY PANEL:   CBC  Recent Labs Lab 05/27/15 0656  WBC 18.2*  HGB 14.8  HCT 43.1  PLT 223   ------------------------------------------------------------------------------------------------------------------  Chemistries   Recent Labs Lab 05/27/15 0656  NA 136  K 3.9  CL 100*  CO2 25  GLUCOSE 168*  BUN 27*  CREATININE 1.11  CALCIUM 10.1  AST 35  ALT 38  ALKPHOS 95  BILITOT 2.3*   ------------------------------------------------------------------------------------------------------------------  Cardiac Enzymes No results for input(s): TROPONINI in the last 168 hours. ------------------------------------------------------------------------------------------------------------------  RADIOLOGY:  US Abdomen Limited Ruq  05/27/2015  CLINICAL DATA:  Right upper quadrant pain for 1 day EXAM: US ABDOMEN LIMITED - RIGHT UPPER QUADRANT COMPARISON:  October 31, 2013 FINDINGS: Gallbladder: Gallbladder is contracted with calculi and sludge noted throughout the gallbladder. Largest individual gallstone or conglomeration of gallstones measures 3.4 cm. There is no appreciable pericholecystic fluid. No sonographic Murphy sign noted by sonographer. Common bile duct: Diameter: 3 mm. No intrahepatic or extrahepatic biliary duct dilatation. Liver: No focal lesion identified. Liver echogenicity overall is increased with a somewhat nodular contour of the liver. IMPRESSION: Gallbladder is contracted and filled with sludge and calculi. No pericholecystic fluid seen. Liver contour is consistent with a degree of hepatic cirrhosis. Liver echogenicity is increased, a finding indicative of hepatic steatosis and/or underlying parenchymal disease. While no focal liver lesions are identified, it must be cautioned that the sensitivity  of ultrasound for focal liver lesions is diminished significantly in this circumstance. Electronically Signed   By: Bretta Bang III M.D.   On: 05/27/2015 08:36    EKG:  No orders found for this or any previous visit.  IMPRESSION AND PLAN:   Acute pancreatitis. NPO, pain control, IVF support, follow up lipase.  Leukocytosis. Due to reaction. F/u CBC.  Cholelithiasis. Surgery consult.  Elevated bilirubin. F/u LFT.  Dehydration. IVF support and f/u BMP.  hepatic cirrhosis. Hold aldactone due to dehydration.   tobacco abuse. Smoking cessation was counseled for 4 min. Nicotine patch.  All the records are reviewed and case discussed with ED provider. Management plans discussed with the patient, his father and they are in agreement.  CODE STATUS: full code.  TOTAL TIME TAKING CARE OF THIS PATIENT: 55 minutes.    Shaune Pollack M.D on 05/27/2015 at 11:31 AM  Between 7am to 6pm - Pager - [phone]  After 6pm go to [email or web address] - password [email or web address] [facility]  [facility]  Office  [phone]  CC: Primary care physician; Megan Mans, MD

## 2015-05-28 LAB — BASIC METABOLIC PANEL
Anion gap: 8 (ref 5–15)
BUN: 20 mg/dL (ref 6–20)
CHLORIDE: 106 mmol/L (ref 101–111)
CO2: 24 mmol/L (ref 22–32)
CREATININE: 0.74 mg/dL (ref 0.61–1.24)
Calcium: 8.3 mg/dL — ABNORMAL LOW (ref 8.9–10.3)
GFR calc Af Amer: >60 mL/min (ref >60)
GFR calc non Af Amer: >60 mL/min (ref >60)
Glucose, Bld: 102 mg/dL — ABNORMAL HIGH (ref 65–99)
Potassium: 4 mmol/L (ref 3.5–5.1)
Sodium: 138 mmol/L (ref 135–145)

## 2015-05-28 LAB — CBC
HCT: 39.5 % — ABNORMAL LOW (ref 40.0–52.0)
Hemoglobin: 13.7 g/dL (ref 13.0–18.0)
MCH: 33.1 pg (ref 26.0–34.0)
MCHC: 34.8 g/dL (ref 32.0–36.0)
MCV: 95.2 fL (ref 80.0–100.0)
PLATELETS: 138 10*3/uL — AB (ref 150–440)
RBC: 4.14 MIL/uL — AB (ref 4.40–5.90)
RDW: 15.5 % — AB (ref 11.5–14.5)
WBC: 12.7 10*3/uL — ABNORMAL HIGH (ref 3.8–10.6)

## 2015-05-28 LAB — LIPASE, BLOOD: Lipase: 1140 U/L — ABNORMAL HIGH (ref 11–51)

## 2015-05-28 LAB — MAGNESIUM: Magnesium: 1.9 mg/dL (ref 1.7–2.4)

## 2015-05-28 MED ORDER — ALBUMIN HUMAN 25 % IV SOLN
12.5000 g | Freq: Once | INTRAVENOUS | Status: AC
  Administered 2015-05-28: 12.5 g via INTRAVENOUS
  Filled 2015-05-28: qty 50

## 2015-05-28 MED ORDER — KETOROLAC TROMETHAMINE 30 MG/ML IJ SOLN
30.0000 mg | Freq: Four times a day (QID) | INTRAMUSCULAR | Status: DC | PRN
  Administered 2015-05-28: 30 mg via INTRAVENOUS
  Filled 2015-05-28: qty 1

## 2015-05-28 MED ORDER — SODIUM CHLORIDE 0.9 % IV SOLN
INTRAVENOUS | Status: DC
  Administered 2015-05-28 – 2015-05-30 (×8): via INTRAVENOUS

## 2015-05-28 NOTE — Progress Notes (Signed)
Per Dr. Everlene FarrierPabon, continue IVF at 125. Trudee KusterBrandi R Mansfield

## 2015-05-28 NOTE — Progress Notes (Signed)
[facility] [place] [facility]   PATIENT NAME: Timothy Rolleratrick Johns    MR#:  [id number]  DATE OF BIRTH:  1965/05/05  SUBJECTIVE:  CHIEF COMPLAINT:   Chief Complaint  Patient presents with  . Abdominal Pain   Better abdominal pain. REVIEW OF SYSTEMS:  CONSTITUTIONAL: No fever, fatigue or weakness.  EYES: No blurred or double vision.  EARS, NOSE, AND THROAT: No tinnitus or ear pain.  RESPIRATORY: No cough, shortness of breath, wheezing or hemoptysis.  CARDIOVASCULAR: No chest pain, orthopnea, edema.  GASTROINTESTINAL: No nausea, vomiting, diarrhea but has abdominal pain.  GENITOURINARY: No dysuria, hematuria.  ENDOCRINE: No polyuria, nocturia,  HEMATOLOGY: No anemia, easy bruising or bleeding SKIN: No rash or lesion. MUSCULOSKELETAL: No joint pain or arthritis.   NEUROLOGIC: No tingling, numbness, weakness.  PSYCHIATRY: No anxiety or depression.   DRUG ALLERGIES:   Allergies  Allergen Reactions  . Tylenol [Acetaminophen] Other (See Comments)    Pt. states PCP told him not to take because of his liver.    VITALS:  Blood pressure 133/63, pulse 87, temperature 98.3 F (36.8 C), temperature source Oral, resp. rate 18, height 5\' 10"  (1.778 m), weight 108.863 kg (240 lb), SpO2 96 %.  PHYSICAL EXAMINATION:  GENERAL:  50 y.o.-year-old patient lying in the bed with no acute distress. Obese. EYES: Pupils equal, round, reactive to light and accommodation. No scleral icterus. Extraocular muscles intact.  HEENT: Head atraumatic, normocephalic. Oropharynx and nasopharynx clear.  NECK:  Supple, no jugular venous distention. No thyroid enlargement, no tenderness.  LUNGS: Normal breath sounds bilaterally, no wheezing, rales,rhonchi or crepitation. No use of accessory muscles of respiration.  CARDIOVASCULAR: S1, S2 normal. No murmurs, rubs, or gallops.  ABDOMEN: Soft, mild tenderness on RUQ, nondistended. Bowel sounds present. No organomegaly or mass.   EXTREMITIES: No pedal edema, cyanosis, or clubbing.  NEUROLOGIC: Cranial nerves II through XII are intact. Muscle strength 5/5 in all extremities. Sensation intact. Gait not checked.  PSYCHIATRIC: The patient is alert and oriented x 3.  SKIN: No obvious rash, lesion, or ulcer.    LABORATORY PANEL:   CBC  Recent Labs Lab 05/28/15 0333  WBC 12.7*  HGB 13.7  HCT 39.5*  PLT 138*   ------------------------------------------------------------------------------------------------------------------  Chemistries   Recent Labs Lab 05/27/15 0656 05/27/15 1554 05/28/15 0333  NA 136  --  138  K 3.9  --  4.0  CL 100*  --  106  CO2 25  --  24  GLUCOSE 168*  --  102*  BUN 27*  --  20  CREATININE 1.11  --  0.74  CALCIUM 10.1  --  8.3*  MG 1.6*  --  1.9  AST 35  --   --   ALT 38  --   --   ALKPHOS 95  --   --   BILITOT 2.3* 2.2*  --    ------------------------------------------------------------------------------------------------------------------  Cardiac Enzymes No results for input(s): TROPONINI in the last 168 hours. ------------------------------------------------------------------------------------------------------------------  RADIOLOGY:  Ct Abdomen Pelvis W Contrast  05/27/2015  CLINICAL DATA:  Epigastric and right upper quadrant abdominal pain since yesterday afternoon. History of alcoholic cirrhosis. The patient quit drinking alcohol 1.5 years ago. No recent paracentesis. EXAM: CT ABDOMEN AND PELVIS WITH CONTRAST TECHNIQUE: Multidetector CT imaging of the abdomen and pelvis was performed using the standard protocol following bolus administration of intravenous contrast. CONTRAST:  100mL ISOVUE-300 IOPAMIDOL (ISOVUE-300) INJECTION 61% COMPARISON:  Previous abdomen ultrasounds, the most recent dated 05/27/2015. FINDINGS: Lower chest:  Mild bibasilar atelectasis or scarring. Hepatobiliary: Nodular liver contours with a small right lobe, enlarged lateral segment left lobe and  mildly enlarged caudate lobe. Mild diffuse low density of the liver relative to the spleen. Small gallstones in the gallbladder as well as gallbladder wall calcifications and mild wall thickening. Pancreas: Poorly defined margins with diffuse peripancreatic edema, most pronounced involving the tail, extending into the left anterior pararenal space bilaterally and extending inferiorly. No fluid collections. Spleen: Within normal limits in size and appearance. Adrenals/Urinary Tract: Normal appearing adrenal glands, kidneys, ureters and urinary bladder. No urinary tract calculi or hydronephrosis. Stomach/Bowel: Right pericolonic soft tissue stranding. Mild diffuse antral and proximal duodenal wall thickening. No evidence of appendicitis. Vascular/Lymphatic: No pathologically enlarged lymph nodes. No evidence of abdominal aortic aneurysm. Reproductive: No mass or other significant abnormality. Other: Small amount of free peritoneal fluid. Musculoskeletal: Thoracolumbar spine degenerative changes, including changes of DISH. IMPRESSION: 1. Acute, uncomplicated pancreatitis. 2. Changes of cirrhosis of the liver. 3. Mild diffuse hepatic steatosis. 4. 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 This places the patient at increased risk for gallbladder carcinoma. 5. Cholelithiasis. 6. Right pericolonic soft tissue stranding. This is most likely due to the inflammatory changes tracking in that region from the pancreatitis, rather than colitis. 7. Mild diffuse antral and proximal duodenal wall thickening. This is also most likely secondary to the adjacent pancreatic inflammatory changes, rather than antritis and duodenitis. 8. Small amount of ascites. Electronically Signed   By: Beckie Salts M.D.   On: 05/27/2015 19:39   US Abdomen Limited Ruq  05/27/2015  CLINICAL DATA:  Right upper quadrant pain for 1 day EXAM: US ABDOMEN  LIMITED - RIGHT UPPER QUADRANT COMPARISON:  October 31, 2013 FINDINGS: Gallbladder: Gallbladder is contracted with calculi and sludge noted throughout the gallbladder. Largest individual gallstone or conglomeration of gallstones measures 3.4 cm. There is no appreciable pericholecystic fluid. No sonographic Murphy sign noted by sonographer. Common bile duct: Diameter: 3 mm. No intrahepatic or extrahepatic biliary duct dilatation. Liver: No focal lesion identified. Liver echogenicity overall is increased with a somewhat nodular contour of the liver. IMPRESSION: Gallbladder is contracted and filled with sludge and calculi. No pericholecystic fluid seen. Liver contour is consistent with a degree of hepatic cirrhosis. Liver echogenicity is increased, a finding indicative of hepatic steatosis and/or underlying parenchymal disease. While no focal liver lesions are identified, it must be cautioned that the sensitivity of ultrasound for focal liver lesions is diminished significantly in this circumstance. Electronically Signed   By: Bretta Bang III M.D.   On: 05/27/2015 08:36    EKG:  No orders found for this or any previous visit.  ASSESSMENT AND PLAN:   Acute pancreatitis. Start clear liquid diet, pain control, IVF support, follow up lipase.  Leukocytosis. Due to reaction. Improving.  Cholelithiasis and chronic cholecystitis and developing porcelain gallbladder. Per Dr. Everlene Farrier, Surgery consult, possible surgery on Friday.  Elevated bilirubin. Bili 2.2.  Dehydration. Improved with IVF support..  Hypomagnesemia. Improved with IV mag.  hepatic cirrhosis. Hold aldactone due to dehydration.   tobacco abuse. Smoking cessation was counseled for 4 min. Nicotine patch.   I discussed with Dr. Everlene Farrier.  All the records are reviewed and case discussed with Care Management/Social Workerr. Management plans discussed with the patient, family and they are in agreement.  CODE STATUS: full code.  TOTAL  TIME TAKING CARE OF THIS PATIENT: 37 minutes.  Greater than 50% time was  spent on coordination of care and face-to-face counseling.  POSSIBLE D/C IN 2-3 DAYS, DEPENDING ON CLINICAL CONDITION.   Shaune Pollack M.D on 05/28/2015 at 11:24 AM  Between 7am to 6pm - Pager - [phone]  After 6pm go to [email or web address] - password [email or web address]  [place] [facility]  Office  [phone]  CC: Primary care physician; Megan Mans, MD

## 2015-05-28 NOTE — Progress Notes (Signed)
CC: GS pancreatitis Subjective: Feeling better  CT reviewed, is evidence of pancreatitis without any necrosis or fluid collections. 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 Trace ascites.  Objective: Vital signs in last 24 hours: Temp:  [98.1 F (36.7 C)-98.3 F (36.8 C)] 98.3 F (36.8 C) (05/10 0501) Pulse Rate:  [81-87] 87 (05/10 0501) Resp:  [18] 18 (05/10 0501) BP: (133-141)/(63-70) 133/63 mmHg (05/10 0501) SpO2:  [95 %-96 %] 96 % (05/10 0501) Last BM Date: 05/26/15  Intake/Output from previous day: 05/09 0701 - 05/10 0700 In: -  Out: 530 [Urine:530] Intake/Output this shift:    Physical exam: NAD alert Abd: soft, miniaml tenderness RUQ, no peritonits Ext No edema , well perfused  Lab Results: CBC   Recent Labs  05/27/15 0656 05/28/15 0333  WBC 18.2* 12.7*  HGB 14.8 13.7  HCT 43.1 39.5*  PLT 223 138*   BMET  Recent Labs  05/27/15 0656 05/28/15 0333  NA 136 138  K 3.9 4.0  CL 100* 106  CO2 25 24  GLUCOSE 168* 102*  BUN 27* 20  CREATININE 1.11 0.74  CALCIUM 10.1 8.3*   PT/INR  Recent Labs  05/27/15 1554  LABPROT 14.2  INR 1.08   ABG No results for input(s): PHART, HCO3 in the last 72 hours.  Invalid input(s): PCO2, PO2  Studies/Results: Ct Abdomen Pelvis W Contrast  05/27/2015  CLINICAL DATA:  Epigastric and right upper quadrant abdominal pain since yesterday afternoon. History of alcoholic cirrhosis. The patient quit drinking alcohol 1.5 years ago. No recent paracentesis. EXAM: CT ABDOMEN AND PELVIS WITH CONTRAST TECHNIQUE: Multidetector CT imaging of the abdomen and pelvis was performed using the standard protocol following bolus administration of intravenous contrast. CONTRAST:  100mL ISOVUE-300 IOPAMIDOL (ISOVUE-300) INJECTION 61% COMPARISON:  Previous abdomen ultrasounds, the most recent dated 05/27/2015. FINDINGS: Lower chest:  Mild  bibasilar atelectasis or scarring. Hepatobiliary: Nodular liver contours with a small right lobe, enlarged lateral segment left lobe and mildly enlarged caudate lobe. Mild diffuse low density of the liver relative to the spleen. Small gallstones in the gallbladder as well as gallbladder wall calcifications and mild wall thickening. Pancreas: Poorly defined margins with diffuse peripancreatic edema, most pronounced involving the tail, extending into the left anterior pararenal space bilaterally and extending inferiorly. No fluid collections. Spleen: Within normal limits in size and appearance. Adrenals/Urinary Tract: Normal appearing adrenal glands, kidneys, ureters and urinary bladder. No urinary tract calculi or hydronephrosis. Stomach/Bowel: Right pericolonic soft tissue stranding. Mild diffuse antral and proximal duodenal wall thickening. No evidence of appendicitis. Vascular/Lymphatic: No pathologically enlarged lymph nodes. No evidence of abdominal aortic aneurysm. Reproductive: No mass or other significant abnormality. Other: Small amount of free peritoneal fluid. Musculoskeletal: Thoracolumbar spine degenerative changes, including changes of DISH. IMPRESSION: 1. Acute, uncomplicated pancreatitis. 2. Changes of cirrhosis of the liver. 3. Mild diffuse hepatic steatosis. 4. 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 This places the patient at increased risk for gallbladder carcinoma. 5. Cholelithiasis. 6. Right pericolonic soft tissue stranding. This is most likely due to the inflammatory changes tracking in that region from the pancreatitis, rather than colitis. 7. Mild diffuse antral and proximal duodenal wall thickening. This is also most likely secondary to the adjacent pancreatic inflammatory changes, rather than antritis and duodenitis. 8. Small amount of ascites. Electronically Signed   By: Zada FindersSteven  Reid M.D.  On: 05/27/2015 19:39   US Abdomen Limited Ruq  05/27/2015  CLINICAL DATA:  Right upper quadrant pain for 1 day EXAM: US ABDOMEN LIMITED - RIGHT UPPER QUADRANT COMPARISON:  October 31, 2013 FINDINGS: Gallbladder: Gallbladder is contracted with calculi and sludge noted throughout the gallbladder. Largest individual gallstone or conglomeration of gallstones measures 3.4 cm. There is no appreciable pericholecystic fluid. No sonographic Murphy sign noted by sonographer. Common bile duct: Diameter: 3 mm. No intrahepatic or extrahepatic biliary duct dilatation. Liver: No focal lesion identified. Liver echogenicity overall is increased with a somewhat nodular contour of the liver. IMPRESSION: Gallbladder is contracted and filled with sludge and calculi. No pericholecystic fluid seen. Liver contour is consistent with a degree of hepatic cirrhosis. Liver echogenicity is increased, a finding indicative of hepatic steatosis and/or underlying parenchymal disease. While no focal liver lesions are identified, it must be cautioned that the sensitivity of ultrasound for focal liver lesions is diminished significantly in this circumstance. Electronically Signed   By: Bretta Bang III M.D.   On: 05/27/2015 08:36    Anti-infectives: Anti-infectives    None      Assessment/Plan: GS pancreatitis, slowly improving, cousin with the patient about the CT findings. He is a child A and we can potentially perform a laparoscopic cholecystectomy during this hospitalization. I did tell him that there is a risk of having gallbladder cancer this risk is anywhere from 2% to 15%. His CT does not look concerning for gallbladder cancer. In any situation will need to wait to make sure his pancreatitis is completely resolved before attempting laparoscopic cholecystectomy. He expressed understanding and he wishes to think about it also given the option of transferring to a hepatobiliary surgeon for the laparoscopic vasectomy given the  porcelain gallbladder. He will think about it and that is no Immediate surgical indication  Sterling Big, MD, FACS  05/28/2015

## 2015-05-28 NOTE — Care Management (Signed)
Admitted from home with acute pancreatitis.  Independent in all adls, denies issues accessing medical care, obtaining medications or with transportation.  Current with PCP- Dr Sullivan LoneGilbert.  Patient has no insurance and says he gets money from hi parents for doctor bills and meds.  CM will need to assess meds at discharge for affordability.  It is anticipated patient will have lap cholecystectomy 5/12 if lipase is decreased.  Lipase continues to be.  > 1000

## 2015-05-28 NOTE — Progress Notes (Signed)
Assumed pt from 1600-1900. Pt resting comfortably. No changes. Toradol given once for RUQ pain with some improvement. Pt declines further action.

## 2015-05-29 DIAGNOSIS — K851 Biliary acute pancreatitis without necrosis or infection: Secondary | ICD-10-CM

## 2015-05-29 DIAGNOSIS — Z01810 Encounter for preprocedural cardiovascular examination: Secondary | ICD-10-CM

## 2015-05-29 LAB — COMPREHENSIVE METABOLIC PANEL
ALBUMIN: 3.1 g/dL — AB (ref 3.5–5.0)
ALT: 40 U/L (ref 17–63)
ANION GAP: 7 (ref 5–15)
AST: 33 U/L (ref 15–41)
Alkaline Phosphatase: 86 U/L (ref 38–126)
BUN: 16 mg/dL (ref 6–20)
CALCIUM: 7.6 mg/dL — AB (ref 8.9–10.3)
CHLORIDE: 103 mmol/L (ref 101–111)
CO2: 22 mmol/L (ref 22–32)
Creatinine, Ser: 0.76 mg/dL (ref 0.61–1.24)
Glucose, Bld: 100 mg/dL — ABNORMAL HIGH (ref 65–99)
Potassium: 3.5 mmol/L (ref 3.5–5.1)
Sodium: 132 mmol/L — ABNORMAL LOW (ref 135–145)
Total Bilirubin: 2.5 mg/dL — ABNORMAL HIGH (ref 0.3–1.2)
Total Protein: 6 g/dL — ABNORMAL LOW (ref 6.5–8.1)

## 2015-05-29 LAB — LIPASE, BLOOD: Lipase: 94 U/L — ABNORMAL HIGH (ref 11–51)

## 2015-05-29 MED ORDER — CHLORHEXIDINE GLUCONATE 4 % EX LIQD
1.0000 "application " | Freq: Once | CUTANEOUS | Status: AC
  Administered 2015-05-30: 1 via TOPICAL

## 2015-05-29 MED ORDER — CEFAZOLIN SODIUM-DEXTROSE 2-4 GM/100ML-% IV SOLN
2.0000 g | INTRAVENOUS | Status: AC
  Administered 2015-05-30: 2 g via INTRAVENOUS
  Filled 2015-05-29: qty 100

## 2015-05-29 MED ORDER — OXYCODONE HCL 5 MG PO TABS
5.0000 mg | ORAL_TABLET | ORAL | Status: DC | PRN
  Administered 2015-05-29 – 2015-05-31 (×6): 5 mg via ORAL
  Filled 2015-05-29 (×6): qty 1

## 2015-05-29 NOTE — Progress Notes (Signed)
CC: GS pancreatitis Subjective: Continues to improve. Tolerating clear liquids Lipase now normal  Objective: Vital signs in last 24 hours: Temp:  [98.3 F (36.8 C)-98.5 F (36.9 C)] 98.5 F (36.9 C) (05/11 0529) Pulse Rate:  [76-84] 84 (05/11 0529) Resp:  [18-20] 18 (05/11 0529) BP: (136-153)/(69-72) 153/69 mmHg (05/11 0529) SpO2:  [94 %-96 %] 94 % (05/11 0529) Last BM Date: 05/28/15  Intake/Output from previous day: 05/10 0701 - 05/11 0700 In: 3645 [P.O.:1320; I.V.:2325] Out: -  Intake/Output this shift:    Physical exam: NAD Abd: soft, NT, no peritonitis Ext: no edema, well perfused Lab Results: CBC   Recent Labs  05/27/15 0656 05/28/15 0333  WBC 18.2* 12.7*  HGB 14.8 13.7  HCT 43.1 39.5*  PLT 223 138*   BMET  Recent Labs  05/28/15 0333 05/29/15 0415  NA 138 132*  K 4.0 3.5  CL 106 103  CO2 24 22  GLUCOSE 102* 100*  BUN 20 16  CREATININE 0.74 0.76  CALCIUM 8.3* 7.6*   PT/INR  Recent Labs  05/27/15 1554  LABPROT 14.2  INR 1.08   ABG No results for input(s): PHART, HCO3 in the last 72 hours.  Invalid input(s): PCO2, PO2  Studies/Results: Ct Abdomen Pelvis W Contrast  05/27/2015  CLINICAL DATA:  Epigastric and right upper quadrant abdominal pain since yesterday afternoon. History of alcoholic cirrhosis. The patient quit drinking alcohol 1.5 years ago. No recent paracentesis. EXAM: CT ABDOMEN AND PELVIS WITH CONTRAST TECHNIQUE: Multidetector CT imaging of the abdomen and pelvis was performed using the standard protocol following bolus administration of intravenous contrast. CONTRAST:  100mL ISOVUE-300 IOPAMIDOL (ISOVUE-300) INJECTION 61% COMPARISON:  Previous abdomen ultrasounds, the most recent dated 05/27/2015. FINDINGS: Lower chest:  Mild bibasilar atelectasis or scarring. Hepatobiliary: Nodular liver contours with a small right lobe, enlarged lateral segment left lobe and mildly enlarged caudate lobe. Mild diffuse low density of the liver  relative to the spleen. Small gallstones in the gallbladder as well as gallbladder wall calcifications and mild wall thickening. Pancreas: Poorly defined margins with diffuse peripancreatic edema, most pronounced involving the tail, extending into the left anterior pararenal space bilaterally and extending inferiorly. No fluid collections. Spleen: Within normal limits in size and appearance. Adrenals/Urinary Tract: Normal appearing adrenal glands, kidneys, ureters and urinary bladder. No urinary tract calculi or hydronephrosis. Stomach/Bowel: Right pericolonic soft tissue stranding. Mild diffuse antral and proximal duodenal wall thickening. No evidence of appendicitis. Vascular/Lymphatic: No pathologically enlarged lymph nodes. No evidence of abdominal aortic aneurysm. Reproductive: No mass or other significant abnormality. Other: Small amount of free peritoneal fluid. Musculoskeletal: Thoracolumbar spine degenerative changes, including changes of DISH. IMPRESSION: 1. Acute, uncomplicated pancreatitis. 2. Changes of cirrhosis of the liver. 3. Mild diffuse hepatic steatosis. 4. 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 This places the patient at increased risk for gallbladder carcinoma. 5. Cholelithiasis. 6. Right pericolonic soft tissue stranding. This is most likely due to the inflammatory changes tracking in that region from the pancreatitis, rather than colitis. 7. Mild diffuse antral and proximal duodenal wall thickening. This is also most likely secondary to the adjacent pancreatic inflammatory changes, rather than antritis and duodenitis. 8. Small amount of ascites. Electronically Signed   By: Beckie SaltsSteven  Reid M.D.   On: 05/27/2015 19:39    Anti-infectives: Anti-infectives    None      Assessment/Plan: GS pancreatitis. Patient which I'll a cirrhosis normal coags style elevation of total bilirubin. Normal  albumin and no evidence of encephalopathy. Discussed with the patient in detail about the procedure. 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 He understands. I did offer him to transfer to the hepatobiliary surgeon, but he wishes to stay here. I do think that it is reasonable since he is a child's a to do him at this institution. He is certainly not decompensated. We will plan for lap chole w IOC in am. The risks, benefits, complications, treatment options, and expected outcomes were discussed with the patient. The possibilities of bleeding, recurrent infection, finding a normal gallbladder, perforation of viscus organs, damage to surrounding structures, bile leak, abscess formation, needing a drain placed, the need for additional procedures, reaction to medication, pulmonary aspiration,  failure to diagnose a condition, the possible need to convert to an open procedure, and creating a complication requiring transfusion or operation were discussed with the patient. The patient  concurred with the proposed plan, giving informed consent.   Sterling Big, MD, [facility]  05/29/2015

## 2015-05-29 NOTE — Progress Notes (Signed)
[facility] [place] [facility]   PATIENT NAME: Timothy Johns    MR#:  [id number]  DATE OF BIRTH:  1965/01/28  SUBJECTIVE:  CHIEF COMPLAINT:   Chief Complaint  Patient presents with  . Abdominal Pain   Better abdominal pain.   Tolerating liquid diet now.  REVIEW OF SYSTEMS:  CONSTITUTIONAL: No fever, fatigue or weakness.  EYES: No blurred or double vision.  EARS, NOSE, AND THROAT: No tinnitus or ear pain.  RESPIRATORY: No cough, shortness of breath, wheezing or hemoptysis.  CARDIOVASCULAR: No chest pain, orthopnea, edema.  GASTROINTESTINAL: No nausea, vomiting, diarrhea but has abdominal pain.  GENITOURINARY: No dysuria, hematuria.  ENDOCRINE: No polyuria, nocturia,  HEMATOLOGY: No anemia, easy bruising or bleeding SKIN: No rash or lesion. MUSCULOSKELETAL: No joint pain or arthritis.   NEUROLOGIC: No tingling, numbness, weakness.  PSYCHIATRY: No anxiety or depression.   DRUG ALLERGIES:   Allergies  Allergen Reactions  . Tylenol [Acetaminophen] Other (See Comments)    Pt. states PCP told him not to take because of his liver.    VITALS:  Blood pressure 156/63, pulse 84, temperature 98.9 F (37.2 C), temperature source Oral, resp. rate 18, height  (1.778 m), weight 108.863 kg (240 lb), SpO2 95 %.  PHYSICAL EXAMINATION:  GENERAL:  50 y.o.-year-old patient lying in the bed with no acute distress. Obese. EYES: Pupils equal, round, reactive to light and accommodation. No scleral icterus. Extraocular muscles intact.  HEENT: Head atraumatic, normocephalic. Oropharynx and nasopharynx clear.  NECK:  Supple, no jugular venous distention. No thyroid enlargement, no tenderness.  LUNGS: Normal breath sounds bilaterally, no wheezing, rales,rhonchi or crepitation. No use of accessory muscles of respiration.  CARDIOVASCULAR: S1, S2 normal. No murmurs, rubs, or gallops.  ABDOMEN: Soft, mild tenderness on RUQ, nondistended. Bowel sounds present. No  organomegaly or mass.  EXTREMITIES: No pedal edema, cyanosis, or clubbing.  NEUROLOGIC: Cranial nerves II through XII are intact. Muscle strength 5/5 in all extremities. Sensation intact. Gait not checked.  PSYCHIATRIC: The patient is alert and oriented x 3.  SKIN: No obvious rash, lesion, or ulcer.   LABORATORY PANEL:   CBC  Recent Labs Lab 05/28/15 0333  WBC 12.7*  HGB 13.7  HCT 39.5*  PLT 138*   ------------------------------------------------------------------------------------------------------------------  Chemistries   Recent Labs Lab 05/28/15 0333 05/29/15 0415  NA 138 132*  K 4.0 3.5  CL 106 103  CO2 24 22  GLUCOSE 102* 100*  BUN 20 16  CREATININE 0.74 0.76  CALCIUM 8.3* 7.6*  MG 1.9  --   AST  --  33  ALT  --  40  ALKPHOS  --  86  BILITOT  --  2.5*   ------------------------------------------------------------------------------------------------------------------  Cardiac Enzymes No results for input(s): TROPONINI in the last 168 hours. ------------------------------------------------------------------------------------------------------------------  RADIOLOGY:  Ct Abdomen Pelvis W Contrast  05/27/2015  CLINICAL DATA:  Epigastric and right upper quadrant abdominal pain since yesterday afternoon. History of alcoholic cirrhosis. The patient quit drinking alcohol 1.5 years ago. No recent paracentesis. EXAM: CT ABDOMEN AND PELVIS WITH CONTRAST TECHNIQUE: Multidetector CT imaging of the abdomen and pelvis was performed using the standard protocol following bolus administration of intravenous contrast. CONTRAST:  ISOVUE-300 IOPAMIDOL (ISOVUE-300) INJECTION 61% COMPARISON:  Previous abdomen ultrasounds, the most recent dated 05/27/2015. FINDINGS: Lower chest:  Mild bibasilar atelectasis or scarring. Hepatobiliary: Nodular liver contours with a small right lobe, enlarged lateral segment left lobe and mildly enlarged caudate lobe. Mild diffuse low density of the  liver relative to the spleen. Small gallstones in the gallbladder as well as gallbladder wall calcifications and mild wall thickening. Pancreas: Poorly defined margins with diffuse peripancreatic edema, most pronounced involving the tail, extending into the left anterior pararenal space bilaterally and extending inferiorly. No fluid collections. Spleen: Within normal limits in size and appearance. Adrenals/Urinary Tract: Normal appearing adrenal glands, kidneys, ureters and urinary bladder. No urinary tract calculi or hydronephrosis. Stomach/Bowel: Right pericolonic soft tissue stranding. Mild diffuse antral and proximal duodenal wall thickening. No evidence of appendicitis. Vascular/Lymphatic: No pathologically enlarged lymph nodes. No evidence of abdominal aortic aneurysm. Reproductive: No mass or other significant abnormality. Other: Small amount of free peritoneal fluid. Musculoskeletal: Thoracolumbar spine degenerative changes, including changes of DISH. IMPRESSION: 1. Acute, uncomplicated pancreatitis. 2. Changes of cirrhosis of the liver. 3. Mild diffuse hepatic steatosis. 4. 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 This places the patient at increased risk for gallbladder carcinoma. 5. Cholelithiasis. 6. Right pericolonic soft tissue stranding. This is most likely due to the inflammatory changes tracking in that region from the pancreatitis, rather than colitis. 7. Mild diffuse antral and proximal duodenal wall thickening. This is also most likely secondary to the adjacent pancreatic inflammatory changes, rather than antritis and duodenitis. 8. Small amount of ascites. Electronically Signed   By: Beckie SaltsSteven  Reid M.D.   On: 05/27/2015 19:39    EKG:  No orders found for this or any previous visit.  ASSESSMENT AND PLAN:   Acute pancreatitis. Start clear liquid diet, pain control, IVF support, follow up  lipase came down.  Leukocytosis. Due to reaction. Improving.  Cholelithiasis and chronic cholecystitis and developing porcelain gallbladder. Per Dr. Everlene FarrierPabon, Surgery consult, planned surgery on Friday.  Elevated bilirubin. Bili 2.2.   Went up to 2.5   Monitor after cholecystectomy.  Dehydration. Improved with IVF support..  Hypomagnesemia. Improved with IV mag.  hepatic cirrhosis. Hold aldactone due to dehydration.   tobacco abuse. Smoking cessation was counseled for 4 min. Nicotine patch.   All the records are reviewed and case discussed with Care Management/Social Workerr. Management plans discussed with the patient, family and they are in agreement.  CODE STATUS: full code.  TOTAL TIME TAKING CARE OF THIS PATIENT: 37 minutes.  Greater than 50% time was spent on coordination of care and face-to-face counseling.  POSSIBLE D/C IN 2-3 DAYS, DEPENDING ON CLINICAL CONDITION.   Altamese DillingVACHHANI, Tylyn Derwin M.D on 05/29/2015 at 1:35 PM  Between 7am to 6pm - Pager - [phone]  After 6pm go to [email or web address] - password [email or web address] [facility]  [facility]  Office  [phone]  CC: Primary care physician; Megan Mansichard Gilbert Jr, MD

## 2015-05-30 ENCOUNTER — Encounter
Admission: EM | Disposition: A | Discharge status: Home / Self Care | Payer: Self-pay | Source: Home / Self Care | Attending: Internal Medicine

## 2015-05-30 ENCOUNTER — Inpatient Hospital Stay: Payer: Self-pay | Admitting: Anesthesiology

## 2015-05-30 ENCOUNTER — Encounter: Payer: Self-pay | Admitting: *Deleted

## 2015-05-30 ENCOUNTER — Inpatient Hospital Stay: Payer: MEDICAID

## 2015-05-30 ENCOUNTER — Inpatient Hospital Stay: Payer: MEDICAID | Admitting: Anesthesiology

## 2015-05-30 HISTORY — PX: CHOLECYSTECTOMY: SHX55

## 2015-05-30 SURGERY — LAPAROSCOPIC CHOLECYSTECTOMY WITH INTRAOPERATIVE CHOLANGIOGRAM
Anesthesia: General | Wound class: Clean Contaminated

## 2015-05-30 MED ORDER — HYDROMORPHONE HCL 1 MG/ML IJ SOLN
INTRAMUSCULAR | Status: AC
  Filled 2015-05-30: qty 1

## 2015-05-30 MED ORDER — ROCURONIUM BROMIDE 100 MG/10ML IV SOLN
INTRAVENOUS | Status: DC | PRN
  Administered 2015-05-30: 40 mg via INTRAVENOUS
  Administered 2015-05-30 (×2): 10 mg via INTRAVENOUS
  Administered 2015-05-30: 5 mg via INTRAVENOUS

## 2015-05-30 MED ORDER — PHENYLEPHRINE HCL 10 MG/ML IJ SOLN
INTRAMUSCULAR | Status: DC | PRN
  Administered 2015-05-30 (×4): 100 ug via INTRAVENOUS

## 2015-05-30 MED ORDER — IOPAMIDOL (ISOVUE-300) INJECTION 61%
INTRAVENOUS | Status: DC | PRN
  Administered 2015-05-30: 10 mL

## 2015-05-30 MED ORDER — EPHEDRINE SULFATE 50 MG/ML IJ SOLN
INTRAMUSCULAR | Status: DC | PRN
  Administered 2015-05-30: 5 mg via INTRAVENOUS

## 2015-05-30 MED ORDER — PROPOFOL 10 MG/ML IV BOLUS
INTRAVENOUS | Status: DC | PRN
  Administered 2015-05-30: 200 mg via INTRAVENOUS

## 2015-05-30 MED ORDER — SODIUM CHLORIDE 0.9 % IJ SOLN
INTRAMUSCULAR | Status: AC
  Filled 2015-05-30: qty 50

## 2015-05-30 MED ORDER — BUPIVACAINE-EPINEPHRINE (PF) 0.25% -1:200000 IJ SOLN
INTRAMUSCULAR | Status: AC
  Filled 2015-05-30: qty 30

## 2015-05-30 MED ORDER — GLYCOPYRROLATE 0.2 MG/ML IJ SOLN
INTRAMUSCULAR | Status: DC | PRN
  Administered 2015-05-30: .6 mg via INTRAVENOUS

## 2015-05-30 MED ORDER — DEXAMETHASONE SODIUM PHOSPHATE 10 MG/ML IJ SOLN
INTRAMUSCULAR | Status: AC
  Filled 2015-05-30: qty 1

## 2015-05-30 MED ORDER — FENTANYL CITRATE (PF) 100 MCG/2ML IJ SOLN
INTRAMUSCULAR | Status: DC | PRN
  Administered 2015-05-30: 50 ug via INTRAVENOUS
  Administered 2015-05-30: 100 ug via INTRAVENOUS
  Administered 2015-05-30: 50 ug via INTRAVENOUS

## 2015-05-30 MED ORDER — DEXAMETHASONE SODIUM PHOSPHATE 10 MG/ML IJ SOLN
8.0000 mg | Freq: Once | INTRAMUSCULAR | Status: AC | PRN
  Administered 2015-05-30: 8 mg via INTRAVENOUS

## 2015-05-30 MED ORDER — BUPIVACAINE-EPINEPHRINE 0.25% -1:200000 IJ SOLN
INTRAMUSCULAR | Status: DC | PRN
  Administered 2015-05-30: 30 mL

## 2015-05-30 MED ORDER — HYDROMORPHONE HCL 1 MG/ML IJ SOLN
0.2500 mg | INTRAMUSCULAR | Status: DC | PRN
  Administered 2015-05-30 (×2): 0.5 mg via INTRAVENOUS

## 2015-05-30 MED ORDER — MIDAZOLAM HCL 2 MG/2ML IJ SOLN
INTRAMUSCULAR | Status: DC | PRN
  Administered 2015-05-30: 2 mg via INTRAVENOUS

## 2015-05-30 MED ORDER — ONDANSETRON HCL 4 MG/2ML IJ SOLN
INTRAMUSCULAR | Status: DC | PRN
  Administered 2015-05-30: 4 mg via INTRAVENOUS

## 2015-05-30 MED ORDER — NEOSTIGMINE METHYLSULFATE 10 MG/10ML IV SOLN
INTRAVENOUS | Status: DC | PRN
  Administered 2015-05-30: 4 mg via INTRAVENOUS

## 2015-05-30 MED ORDER — LIDOCAINE HCL (CARDIAC) 20 MG/ML IV SOLN
INTRAVENOUS | Status: DC | PRN
  Administered 2015-05-30: 100 mg via INTRAVENOUS

## 2015-05-30 MED ORDER — BUPIVACAINE HCL (PF) 0.25 % IJ SOLN
INTRAMUSCULAR | Status: AC
  Filled 2015-05-30: qty 30

## 2015-05-30 SURGICAL SUPPLY — 45 items
APPLIER CLIP 5 13 M/L LIGAMAX5 (MISCELLANEOUS) ×3
BLADE SURG 15 STRL LF DISP TIS (BLADE) ×1 IMPLANT
BLADE SURG 15 STRL SS (BLADE) ×2
BULB RESERV EVAC DRAIN JP 100C (MISCELLANEOUS) ×3 IMPLANT
CANISTER SUCT 1200ML W/VALVE (MISCELLANEOUS) ×3 IMPLANT
CHLORAPREP W/TINT 26ML (MISCELLANEOUS) ×3 IMPLANT
CHOLANGIOGRAM CATH TAUT (CATHETERS) IMPLANT
CLEANER CAUTERY TIP 5X5 PAD (MISCELLANEOUS) ×1 IMPLANT
CLIP APPLIE 5 13 M/L LIGAMAX5 (MISCELLANEOUS) ×1 IMPLANT
DECANTER SPIKE VIAL GLASS SM (MISCELLANEOUS) ×6 IMPLANT
DEVICE TROCAR PUNCTURE CLOSURE (ENDOMECHANICALS) IMPLANT
DRAIN CHANNEL JP 19F (MISCELLANEOUS) ×3 IMPLANT
DRAPE C-ARM XRAY 36X54 (DRAPES) ×3 IMPLANT
ELECT REM PT RETURN 9FT ADLT (ELECTROSURGICAL) ×3
ELECTRODE REM PT RTRN 9FT ADLT (ELECTROSURGICAL) ×1 IMPLANT
ENDOPOUCH RETRIEVER 10 (MISCELLANEOUS) ×3 IMPLANT
GLOVE BIO SURGEON STRL SZ7 (GLOVE) ×3 IMPLANT
GOWN STRL REUS W/ TWL LRG LVL3 (GOWN DISPOSABLE) ×3 IMPLANT
GOWN STRL REUS W/TWL LRG LVL3 (GOWN DISPOSABLE) ×6
HEMOSTAT SURGICEL 2X14 (HEMOSTASIS) ×6 IMPLANT
IRRIGATION STRYKERFLOW (MISCELLANEOUS) ×1 IMPLANT
IRRIGATOR STRYKERFLOW (MISCELLANEOUS) ×3
IV CATH ANGIO 12GX3 LT BLUE (NEEDLE) ×3 IMPLANT
IV SOD CHL 0.9% 1000ML (IV SOLUTION) ×3 IMPLANT
L-HOOK LAP DISP 36CM (ELECTROSURGICAL) ×3
LHOOK LAP DISP 36CM (ELECTROSURGICAL) ×1 IMPLANT
LIQUID BAND (GAUZE/BANDAGES/DRESSINGS) ×3 IMPLANT
NEEDLE HYPO 22GX1.5 SAFETY (NEEDLE) ×3 IMPLANT
PACK LAP CHOLECYSTECTOMY (MISCELLANEOUS) ×3 IMPLANT
PAD CLEANER CAUTERY TIP 5X5 (MISCELLANEOUS) ×2
PENCIL ELECTRO HAND CTR (MISCELLANEOUS) ×3 IMPLANT
SCISSORS METZENBAUM CVD 33 (INSTRUMENTS) ×3 IMPLANT
SLEEVE ENDOPATH XCEL 5M (ENDOMECHANICALS) ×6 IMPLANT
SPONGE DRAIN TRACH 4X4 STRL 2S (GAUZE/BANDAGES/DRESSINGS) ×3 IMPLANT
STOPCOCK 3 WAY  REPLAC (MISCELLANEOUS) IMPLANT
SUT ETHIBOND 0 MO6 C/R (SUTURE) IMPLANT
SUT MNCRL AB 4-0 PS2 18 (SUTURE) ×3 IMPLANT
SUT SILK 2 0 SH (SUTURE) ×3 IMPLANT
SUT VIC AB 0 CT2 27 (SUTURE) IMPLANT
SUT VICRYL 0 AB UR-6 (SUTURE) ×6 IMPLANT
SYR 20CC LL (SYRINGE) ×3 IMPLANT
TROCAR XCEL BLUNT TIP 100MML (ENDOMECHANICALS) ×3 IMPLANT
TROCAR XCEL NON-BLD 5MMX100MML (ENDOMECHANICALS) ×3 IMPLANT
TUBING INSUFFLATOR HI FLOW (MISCELLANEOUS) ×3 IMPLANT
WATER STERILE IRR 1000ML POUR (IV SOLUTION) ×3 IMPLANT

## 2015-05-30 NOTE — Progress Notes (Signed)
Preoperative Review   Patient is met this am. Historyy is reviewed in the chart and with the patient. I personally reviewed the options and rationale as well as the risks of this procedure that have been previously discussed with the patient. All questions asked by the patient were answered to their satisfaction. Plan for lap chole w IOC, possible open  Patient agrees to proceed with this procedure at this time.  Caroleen Hamman M.D. FACS

## 2015-05-30 NOTE — Anesthesia Procedure Notes (Signed)
Procedure Name: Intubation Performed by: Casey BurkittHOANG, Timothy Johns Pre-anesthesia Checklist: Patient identified, Emergency Drugs available, Suction available, Patient being monitored and Timeout performed Patient Re-evaluated:Patient Re-evaluated prior to inductionOxygen Delivery Method: Circle system utilized Preoxygenation: Pre-oxygenation with 100% oxygen Intubation Type: IV induction Ventilation: Mask ventilation without difficulty Laryngoscope Size: Mac and 3 Grade View: Grade I Tube type: Oral Tube size: 7.5 mm Number of attempts: 1 Airway Equipment and Method: Stylet Placement Confirmation: ETT inserted through vocal cords under direct vision,  positive ETCO2 and breath sounds checked- equal and bilateral Secured at: 22 cm Tube secured with: Tape Dental Injury: Teeth and Oropharynx as per pre-operative assessment

## 2015-05-30 NOTE — Care Management Note (Signed)
Case Management Note  Patient Details  Name: Timothy Johns MRN: [id number] Date of Birth: 08/01/1965  Subjective/Objective:                  Met with patient and his parents to discuss discharge planning. He does not want to change PCP's. He sees Dr Rosanna Randy. He does have difficulty paying for medications and he has the application to Medication Management for assistance. He is independent with mobility. His contact number is [phone]. His parents provide transportation.  Action/Plan: RNCM to follow for [facility] assistance.   Expected Discharge Date:                  Expected Discharge Plan:     In-House Referral:     Discharge planning Services  CM Consult, [facility], [facility] Program, Medication Assistance  Post Acute Care Choice:    Choice offered to:  Patient, Parent  DME Arranged:    DME Agency:     HH Arranged:    [place] Agency:     Status of Service:  In process, will continue to follow  Medicare Important Message Given:    Date Medicare IM Given:    Medicare IM give by:    Date Additional Medicare IM Given:    Additional Medicare Important Message give by:     If discussed at [place] of Stay Meetings, dates discussed:    Additional Comments:  Marshell Garfinkel, RN 05/30/2015, 12:12 PM

## 2015-05-30 NOTE — Op Note (Signed)
Laparoscopic Cholecystectomy  Pre-operative Diagnosis: Acute on chronic cholecystitis                                              Porcelain gallbladder                                              Gallstone pancreatitis                                              Cirrhosis of the liver                                              Obesity  Post-operative Diagnosis: Same  Procedure: 1. Laparoscopic lysis of adhesions                      2. Laparoscopic cholecystectomy with IOC  Surgeon: Sterling Big, MD FACS  Anesthesia: Gen. 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    Estimated Blood Loss: 150cc         Drains: #19 Blake drain         Specimens: Gallbladder           Complications: none   Procedure Details  The patient was seen again in the Holding Room. The benefits, complications, treatment options, and expected outcomes were discussed with the patient. The risks of bleeding, infection, recurrence of symptoms, failure to resolve symptoms, bile duct damage, bile duct leak, retained common bile duct stone, bowel injury, any of which could require further surgery and/or ERCP, stent, or papillotomy were reviewed with the patient. The likelihood of improving the patient's symptoms with return to their baseline status is good.  The patient and/or family concurred with the proposed plan, giving informed consent.  The patient was taken to Operating Room, identified as REVIS WHALIN and the procedure verified as Laparoscopic Cholecystectomy.  A Time Out was held and the above information  confirmed.  Prior to the induction of general anesthesia, antibiotic prophylaxis was administered. VTE prophylaxis was in place. General endotracheal anesthesia was then administered and tolerated well. After the induction, the abdomen was prepped with Chloraprep and draped in the sterile fashion. The patient was positioned in the supine position.  Local anesthetic  was injected into the skin near the umbilicus and an incision made. Cut down technique was used to enter the abdominal cavity and a Hasson trochar was placed after two vicryl stitches were anchored to the fascia. Pneumoperitoneum was then created with CO2 and tolerated well without any adverse changes in the patient's vital signs.  Three 5-mm ports were placed in the right upper quadrant all under direct vision.  All skin incisions  were infiltrated with a local anesthetic agent before making the incision and placing the trocars.   The patient was positioned  in reverse Trendelenburg, tilted slightly to the patient's left. There was some mild ascites about 200 cc in the right upper quadrant and this was aspirated. There was significant adhesions that were thick from the omentum to the liver and also from the omentum to the gallbladder. We took this down with electrocautery and being very careful to avoid any injury.  The gallbladder was identified, the fundus grasped and retracted cephalad. Adhesions were lysed bluntly. The infundibulum was grasped and retracted laterally, exposing the peritoneum overlying the triangle of Calot. This was then divided and exposed in a blunt fashion. An extended critical view of the cystic duct and cystic artery was obtained.  The cystic duct was clearly identified and bluntly dissected.    A cholangiogram was performed with adequate retrograde and antegrade opacification of the biliary tree. No evidence of filling defects. Cystic Artery and duct were double clipped and divided.  The gallbladder was taken from  the gallbladder fossa in a retrograde fashion with the electrocautery. The gallbladder was removed and placed in an Endocatch bag. The liver bed was irrigated and inspected. Hemostasis was achieved with the electrocautery. Copious irrigation was utilized and was repeatedly aspirated until clear.  The gallbladder and Endocatch sac were then removed through the epigastric port site.  A 19 Blake drain was placed in the right upper quadrant under direct visualization  Inspection of the right upper quadrant was performed. No bleeding, bile duct injury or leak, or bowel injury was noted. Pneumoperitoneum was released.  The periumbilical port site was closed with multiple interrupted 0 Vicryl sutures. 4-0 subcuticular Monocryl was used to close the skin. Dermabond was  applied.  The patient was then extubated and brought to the recovery room in stable condition. Sponge, lap, and needle counts were correct at closure and at the conclusion of the case.               Sterling Bigiego Pabon, MD, FACS

## 2015-05-30 NOTE — Progress Notes (Signed)
[facility] [place] [facility]   PATIENT NAME: Timothy Johns    MR#:  [id number]  DATE OF BIRTH:  01-10-1966  SUBJECTIVE:  CHIEF COMPLAINT:   Chief Complaint  Patient presents with  . Abdominal Pain   Better abdominal pain.   Plan for cholecystectomy today.  REVIEW OF SYSTEMS:  CONSTITUTIONAL: No fever, fatigue or weakness.  EYES: No blurred or double vision.  EARS, NOSE, AND THROAT: No tinnitus or ear pain.  RESPIRATORY: No cough, shortness of breath, wheezing or hemoptysis.  CARDIOVASCULAR: No chest pain, orthopnea, edema.  GASTROINTESTINAL: No nausea, vomiting, diarrhea but has abdominal pain.  GENITOURINARY: No dysuria, hematuria.  ENDOCRINE: No polyuria, nocturia,  HEMATOLOGY: No anemia, easy bruising or bleeding SKIN: No rash or lesion. MUSCULOSKELETAL: No joint pain or arthritis.   NEUROLOGIC: No tingling, numbness, weakness.  PSYCHIATRY: No anxiety or depression.   DRUG ALLERGIES:   Allergies  Allergen Reactions  . Tylenol [Acetaminophen] Other (See Comments)    Pt. states PCP told him not to take because of his liver.    VITALS:  Blood pressure 145/64, pulse 74, temperature 97.9 F (36.6 C), temperature source Oral, resp. rate 21, height  (1.778 m), weight 108.863 kg (240 lb), SpO2 93 %.  PHYSICAL EXAMINATION:  GENERAL:  50 y.o.-year-old patient lying in the bed with no acute distress. Obese. EYES: Pupils equal, round, reactive to light and accommodation. No scleral icterus. Extraocular muscles intact.  HEENT: Head atraumatic, normocephalic. Oropharynx and nasopharynx clear.  NECK:  Supple, no jugular venous distention. No thyroid enlargement, no tenderness.  LUNGS: Normal breath sounds bilaterally, no wheezing, rales,rhonchi or crepitation. No use of accessory muscles of respiration.  CARDIOVASCULAR: S1, S2 normal. No murmurs, rubs, or gallops.  ABDOMEN: Soft, mild tenderness on RUQ, nondistended. Bowel sounds present.  No organomegaly or mass.  EXTREMITIES: No pedal edema, cyanosis, or clubbing.  NEUROLOGIC: Cranial nerves II through XII are intact. Muscle strength 5/5 in all extremities. Sensation intact. Gait not checked.  PSYCHIATRIC: The patient is alert and oriented x 3.  SKIN: No obvious rash, lesion, or ulcer.   LABORATORY PANEL:   CBC  Recent Labs Lab 05/28/15 0333  WBC 12.7*  HGB 13.7  HCT 39.5*  PLT 138*   ------------------------------------------------------------------------------------------------------------------  Chemistries   Recent Labs Lab 05/28/15 0333 05/29/15 0415  NA 138 132*  K 4.0 3.5  CL 106 103  CO2 24 22  GLUCOSE 102* 100*  BUN 20 16  CREATININE 0.74 0.76  CALCIUM 8.3* 7.6*  MG 1.9  --   AST  --  33  ALT  --  40  ALKPHOS  --  86  BILITOT  --  2.5*   ------------------------------------------------------------------------------------------------------------------  Cardiac Enzymes No results for input(s): TROPONINI in the last 168 hours. ------------------------------------------------------------------------------------------------------------------  RADIOLOGY:  Dg Cholangiogram Operative  05/30/2015  CLINICAL DATA:  Cholecystitis EXAM: INTRAOPERATIVE CHOLANGIOGRAM TECHNIQUE: Cholangiographic images from the C-arm fluoroscopic device were submitted for interpretation post-operatively. Please see the procedural report for the amount of contrast and the fluoroscopy time utilized. COMPARISON:  CT 05/27/2015 FINDINGS: No persistent filling defects in the common duct. However, the distal CBD is incompletely visualized. Intrahepatic ducts are incompletely visualized, appearing decompressed centrally. Contrast passes into the duodenum. : Negative for retained common duct stone in visualized portions of CBD Electronically Signed   By: Corlis Leak M.D.   On: 05/30/2015 14:10    EKG:   Orders placed or performed during the hospital encounter of 05/27/15  .  EKG  12-Lead  . EKG 12-Lead    ASSESSMENT AND PLAN:   * Acute pancreatitis. Started clear liquid diet, pain control, IVF support, follow up lipase came down.  reason was cholecystectomy.  * Leukocytosis. Due to reaction. Improving.  * Cholelithiasis and chronic cholecystitis and developing porcelain gallbladder.  Per Dr. Everlene FarrierPabon, Surgery consult, planned surgery today.  * Elevated bilirubin. Bili 2.2.   Went up to 2.5   Monitor after cholecystectomy tomorrow.  * Dehydration. Improved with IVF support.  * Hypomagnesemia. Improved with IV mag.  * hepatic cirrhosis. Hold aldactone due to dehydration.   * tobacco abuse. Smoking cessation was counseled for 4 min. Nicotine patch.   All the records are reviewed and case discussed with Care Management/Social Workerr. Management plans discussed with the patient, family and they are in agreement.  CODE STATUS: full code.  TOTAL TIME TAKING CARE OF THIS PATIENT: 30 minutes.  Greater than 50% time was spent on coordination of care and face-to-face counseling.  POSSIBLE D/C IN 1-2 DAYS, DEPENDING ON CLINICAL CONDITION.   Altamese DillingVACHHANI, Shadoe Bethel M.D on 05/30/2015 at 6:53 PM  Between 7am to 6pm - Pager - [phone]  After 6pm go to [email or web address] - password [email or web address] [email or web address]  [facility]  Office  [phone]  CC: Primary care physician; Megan Mansichard Gilbert Jr, MD

## 2015-05-30 NOTE — Anesthesia Preprocedure Evaluation (Signed)
Anesthesia Evaluation  Patient identified by MRN, date of birth, ID band Patient awake    Reviewed: Allergy & Precautions, NPO status , Patient's Chart, lab work & pertinent test results  Airway Mallampati: II  TM Distance: >3 FB Neck ROM: Full    Dental  (+) Teeth Intact   Pulmonary Current Smoker,    Pulmonary exam normal        Cardiovascular Exercise Tolerance: Poor (-) hypertensionNormal cardiovascular exam     Neuro/Psych    GI/Hepatic (+) Cirrhosis   ascites    ,   Endo/Other    Renal/GU      Musculoskeletal   Abdominal (+) + obese,   Peds  Hematology   Anesthesia Other Findings   Reproductive/Obstetrics                             Anesthesia Physical Anesthesia Plan  ASA: III  Anesthesia Plan: General   Post-op Pain Management:    Induction: Intravenous  Airway Management Planned: Oral ETT  Additional Equipment:   Intra-op Plan:   Post-operative Plan: Extubation in OR  Informed Consent: I have reviewed the patients History and Physical, chart, labs and discussed the procedure including the risks, benefits and alternatives for the proposed anesthesia with the patient or authorized representative who has indicated his/her understanding and acceptance.     Plan Discussed with: CRNA and Surgeon  Anesthesia Plan Comments: (CHild's A liver ds--cirrhosis with ascites, no varices. Hb 13.7, platelets 138, normal liver enzymes thus far.)        Anesthesia Quick Evaluation

## 2015-05-30 NOTE — Anesthesia Postprocedure Evaluation (Signed)
Anesthesia Post Note  Patient: Timothy Johns  Procedure(s) Performed: Procedure(s) (LRB): LAPAROSCOPIC CHOLECYSTECTOMY WITH INTRAOPERATIVE CHOLANGIOGRAM (N/A)  Patient location during evaluation: PACU Anesthesia Type: General Level of consciousness: awake Pain management: pain level controlled Vital Signs Assessment: post-procedure vital signs reviewed and stable Respiratory status: spontaneous breathing Cardiovascular status: blood pressure returned to baseline Postop Assessment: no headache Anesthetic complications: no    Last Vitals:  Filed Vitals:   05/30/15 1344 05/30/15 1400  BP: 134/65   Pulse: 84 74  Temp:    Resp: 28 20    Last Pain:  Filed Vitals:   05/30/15 1405  PainSc: 4                  Shevonne Wolf M

## 2015-05-30 NOTE — Transfer of Care (Signed)
Immediate Anesthesia Transfer of Care Note  Patient: Timothy Johns  Procedure(s) Performed: Procedure(s): LAPAROSCOPIC CHOLECYSTECTOMY WITH INTRAOPERATIVE CHOLANGIOGRAM (N/A)  Patient Location: PACU  Anesthesia Type:General  Level of Consciousness: awake, alert  and responds to stimulation  Airway & Oxygen Therapy: Patient Spontanous Breathing and Patient connected to face mask oxygen  Post-op Assessment: Report given to RN and Post -op Vital signs reviewed and stable  Post vital signs: Reviewed and stable  Last Vitals:  Filed Vitals:   05/30/15 1132 05/30/15 1344  BP: 151/79 134/65  Pulse: 71 84  Temp: 38.1 C   Resp: 20 28    Last Pain:  Filed Vitals:   05/30/15 1344  PainSc: 1       Patients Stated Pain Goal: 1 (05/30/15 1132)  Complications: No apparent anesthesia complications

## 2015-05-31 DIAGNOSIS — D72829 Elevated white blood cell count, unspecified: Secondary | ICD-10-CM

## 2015-05-31 DIAGNOSIS — K8020 Calculus of gallbladder without cholecystitis without obstruction: Secondary | ICD-10-CM

## 2015-05-31 DIAGNOSIS — E860 Dehydration: Secondary | ICD-10-CM

## 2015-05-31 DIAGNOSIS — Z716 Tobacco abuse counseling: Secondary | ICD-10-CM

## 2015-05-31 DIAGNOSIS — K812 Acute cholecystitis with chronic cholecystitis: Secondary | ICD-10-CM

## 2015-05-31 LAB — CBC
HEMATOCRIT: 34.4 % — AB (ref 40.0–52.0)
Hemoglobin: 11.8 g/dL — ABNORMAL LOW (ref 13.0–18.0)
MCH: 33.3 pg (ref 26.0–34.0)
MCHC: 34.5 g/dL (ref 32.0–36.0)
MCV: 96.7 fL (ref 80.0–100.0)
PLATELETS: 147 10*3/uL — AB (ref 150–440)
RBC: 3.55 MIL/uL — AB (ref 4.40–5.90)
RDW: 14.8 % — ABNORMAL HIGH (ref 11.5–14.5)
WBC: 13 10*3/uL — AB (ref 3.8–10.6)

## 2015-05-31 LAB — COMPREHENSIVE METABOLIC PANEL
ALT: 41 U/L (ref 17–63)
AST: 37 U/L (ref 15–41)
Albumin: 3.1 g/dL — ABNORMAL LOW (ref 3.5–5.0)
Alkaline Phosphatase: 98 U/L (ref 38–126)
Anion gap: 5 (ref 5–15)
BUN: 20 mg/dL (ref 6–20)
CHLORIDE: 106 mmol/L (ref 101–111)
CO2: 24 mmol/L (ref 22–32)
CREATININE: 0.97 mg/dL (ref 0.61–1.24)
Calcium: 8.6 mg/dL — ABNORMAL LOW (ref 8.9–10.3)
GFR calc non Af Amer: >60 mL/min (ref >60)
Glucose, Bld: 178 mg/dL — ABNORMAL HIGH (ref 65–99)
POTASSIUM: 3.7 mmol/L (ref 3.5–5.1)
SODIUM: 135 mmol/L (ref 135–145)
Total Bilirubin: 1 mg/dL (ref 0.3–1.2)
Total Protein: 6.5 g/dL (ref 6.5–8.1)

## 2015-05-31 MED ORDER — NICOTINE 7 MG/24HR TD PT24: 7.0000 mg | MEDICATED_PATCH | Freq: Every day | TRANSDERMAL | Status: DC

## 2015-05-31 MED ORDER — OXYCODONE HCL 5 MG PO TABS: 5.0000 mg | ORAL_TABLET | ORAL | Status: DC | PRN

## 2015-05-31 NOTE — Care Management Note (Addendum)
Case Management Note  Patient Details  Name: Timothy Johns MRN: [id number] Date of Birth: 1965-04-16  Subjective/Objective:            Discharge to home with no home health. [facility] discharge [facility]         Action/Plan:   Expected Discharge Date:                  Expected Discharge Plan:     In-House Referral:     Discharge planning Services  CM Consult, [facility], [facility], Medication Assistance  Post Acute Care Choice:    Choice offered to:  Patient, Parent  DME Arranged:    DME Agency:     HH Arranged:    HH Agency:     Status of Service:  In process, will continue to follow  Medicare Important Message Given:    Date Medicare IM Given:    Medicare IM give by:    Date Additional Medicare IM Given:    Additional Medicare Important Message give by:     If discussed at Long Length of Stay Meetings, dates discussed:    Additional Comments:  Naraya Stoneberg A, RN 05/31/2015, 12:19 PM

## 2015-05-31 NOTE — Progress Notes (Signed)
Discharge instructions given to patient. IV removed. Prescription for pain medication given. Questions answered. Patient instructed on how to drain JP and care for it. Will follow up with Dr. Everlene FarrierPabon outpatient. Ride is on the way to get patient now.

## 2015-05-31 NOTE — Discharge Summary (Signed)
[facility] [place] [facility]   PATIENT NAME: Timothy Johns    MR#:  [id number]  DATE OF BIRTH:  1965-04-03  DATE OF ADMISSION:  05/27/2015 ADMITTING PHYSICIAN: Shaune Pollack, MD  DATE OF DISCHARGE: No discharge date for patient encounter.  PRIMARY CARE PHYSICIAN: Megan Mans, MD     ADMISSION DIAGNOSIS:  Abdominal pain, right upper quadrant [R10.11] Acute abdominal pain [R10.0] Gallstones [K80.20] Cirrhosis of liver without ascites, unspecified hepatic cirrhosis type (HCC) [K74.60] Acute pancreatitis, unspecified complication status, unspecified pancreatitis type [K85.90]  DISCHARGE DIAGNOSIS:  Principal Problem:   Acute pancreatitis Active Problems:   Acute on chronic cholecystitis   Cholelithiasis   Leukocytosis   Hyperbilirubinemia   Dehydration   Hypomagnesemia   Tobacco abuse counseling   SECONDARY DIAGNOSIS:   Past Medical History  Diagnosis Date  . Cirrhosis (HCC)   . Hypertension     .pro HOSPITAL COURSE:   Patient is 50 year old Caucasian male with past medical history significant for history of liver cirrhosis, essential hypertension, who presents to the hospital with complaints of right upper quadrant abdominal pain, dark urine. On arrival to the hospital patient was noted to have elevated amylase level to 1200 and lipase level of around 2000, elevated total bilirubin to 2.3. Patient's white blood cell count was high at 18.2 thousand. Ultrasound of abdomen. The right upper quadrant revealed contracted gallbladder, filled with sludge and calculi but no pericholecystic fluid was seen, liver contour was consistent with a degree of hepatic cirrhosis, no focal liver lesions were identified. Patient was admitted to the hospital for further evaluation. He was treated with IV fluids, pain medications, nothing by mouth due to acute pancreatitis. The patient was seen by surgery, Dr. Everlene Farrier, we'll get CT scan, which revealed porcelain  looking gallbladder, he recommended laparoscopic cholecystectomy during this hospitalization. Patient's condition improved with conservative therapy and he underwent laparoscopic cholecystectomy by Dr. Everlene Farrier on 05/31/2015. Postoperatively, patient did well, had minimal pain in the right upper quadrant, suture site. The patient was seen by surgeon who recommended to discharge him home with drain and follow-up with him next Friday in the office. Discussion by problem: *Acute pancreatitis, due to gallstone disease, patient's diet was advanced to regular diet, pain control was adequate, lipase improved, patient is to follow-up with his primary care physician as outpatient *Leukocytosis, improving *Cholelithiasis and acute on chronic cholecystitis and developing porcelain gallbladder, cholecystectomy performed on 05/31/2015 by Dr. Everlene Farrier, patient is to follow-up with Dr. Everlene Farrier as outpatient and had his drain removed, likely next week *Elevated bilirubin, likely due to underlying liver cirrhosis, alcoholic and possibly fatty liver disease. Follow liver function tests as outpatient *Dehydration, resolved with IV fluid administration. Patient is off IV fluids now and other regular diet. *Hypomagnesemia, resolved with IV magnesium supplementation *Liver cirrhosis, resume Aldactone for ascites *Tobacco abuse counseling, discussed this patient for approximately 4 minutes during this admission, nicotine pach is ordered for home use.   DISCHARGE CONDITIONS:   Stable  CONSULTS OBTAINED:  Treatment Team:  Leafy Ro, MD  DRUG ALLERGIES:   Allergies  Allergen Reactions  . Tylenol [Acetaminophen] Other (See Comments)    Pt. states PCP told him not to take because of his liver.    DISCHARGE MEDICATIONS:   Current Discharge Medication List    START taking these medications   Details  nicotine (NICODERM CQ - DOSED IN MG/24 HR) 7 mg/24hr patch Place 1 patch (7 mg total) onto the skin daily. Qty: 28  patch, Refills: 0    oxyCODONE (OXY IR/ROXICODONE) 5 MG immediate release tablet Take 1 tablet (5 mg total) by mouth every 4 (four) hours as needed for severe pain. Qty: 30 tablet, Refills: 0      CONTINUE these medications which have NOT CHANGED   Details  diphenhydrAMINE (BENADRYL) 25 mg capsule Take 25 mg by mouth at bedtime as needed for sleep.     Multiple Vitamin (MULTIVITAMIN) tablet Take 1 tablet by mouth daily.    spironolactone (ALDACTONE) 50 MG tablet Take 50 mg by mouth daily.     thiamine 100 MG tablet Take 100 mg by mouth daily.         DISCHARGE INSTRUCTIONS:    Patient is to follow-up with primary care physician and surgeon within one week after discharge  If you experience worsening of your admission symptoms, develop shortness of breath, life threatening emergency, suicidal or homicidal thoughts you must seek medical attention immediately by calling 911 or calling your MD immediately  if symptoms less severe.  You Must read complete instructions/literature along with all the possible adverse reactions/side effects for all the Medicines you take and that have been prescribed to you. Take any new Medicines after you have completely understood and accept all the possible adverse reactions/side effects.   Please note  You were cared for by a hospitalist during your hospital stay. If you have any questions about your discharge medications or the care you received while you were in the hospital after you are discharged, you can call the unit and asked to speak with the hospitalist on call if the hospitalist that took care of you is not available. Once you are discharged, your primary care physician will handle any further medical issues. Please note that NO REFILLS for any discharge medications will be authorized once you are discharged, as it is imperative that you return to your primary care physician (or establish a relationship with a primary care physician if you do not  have one) for your aftercare needs so that they can reassess your need for medications and monitor your lab values.    Today   CHIEF COMPLAINT:   Chief Complaint  Patient presents with  . Abdominal Pain    HISTORY OF PRESENT ILLNESS:  Timothy Johns  is a 50 y.o. male with a known history of liver cirrhosis, essential hypertension, who presents to the hospital with complaints of right upper quadrant abdominal pain, dark urine. On arrival to the hospital patient was noted to have elevated amylase level to 1200 and lipase level of around 2000, elevated total bilirubin to 2.3. Patient's white blood cell count was high at 18.2 thousand. Ultrasound of abdomen. The right upper quadrant revealed contracted gallbladder, filled with sludge and calculi but no pericholecystic fluid was seen, liver contour was consistent with a degree of hepatic cirrhosis, no focal liver lesions were identified. Patient was admitted to the hospital for further evaluation. He was treated with IV fluids, pain medications, nothing by mouth due to acute pancreatitis. The patient was seen by surgery, Dr. Everlene FarrierPabon, we'll get CT scan, which revealed porcelain looking gallbladder, he recommended laparoscopic cholecystectomy during this hospitalization. Patient's condition improved with conservative therapy and he underwent laparoscopic cholecystectomy by Dr. Everlene FarrierPabon on 05/31/2015. Postoperatively, patient did well, had minimal pain in the right upper quadrant, suture site. The patient was seen by surgeon who recommended to discharge him home with drain and follow-up with him next Friday in the office. Discussion by problem: *Acute  pancreatitis, due to gallstone disease, patient's diet was advanced to regular diet, pain control was adequate, lipase improved, patient is to follow-up with his primary care physician as outpatient *Leukocytosis, improving *Cholelithiasis and acute on chronic cholecystitis and developing porcelain gallbladder,  cholecystectomy performed on 05/31/2015 by Dr. Everlene Farrier, patient is to follow-up with Dr. Everlene Farrier as outpatient and had his drain removed, likely next week *Elevated bilirubin, likely due to underlying liver cirrhosis, alcoholic and possibly fatty liver disease. Follow liver function tests as outpatient *Dehydration, resolved with IV fluid administration. Patient is off IV fluids now and other regular diet. *Hypomagnesemia, resolved with IV magnesium supplementation *Liver cirrhosis, resume Aldactone for ascites *Tobacco abuse counseling, discussed this patient for approximately 4 minutes during this admission, nicotine pach is ordered for home use.     VITAL SIGNS:  Blood pressure 122/66, pulse 48, temperature 97.6 F (36.4 C), temperature source Oral, resp. rate 20, height 5\' 10"  (1.778 m), weight 108.863 kg (240 lb), SpO2 93 %.  I/O:   Intake/Output Summary (Last 24 hours) at 05/31/15 1232 Last data filed at 05/31/15 0943  Gross per 24 hour  Intake   2680 ml  Output    485 ml  Net   2195 ml    PHYSICAL EXAMINATION:  GENERAL:  50 y.o.-year-old patient lying in the bed with no acute distress.  EYES: Pupils equal, round, reactive to light and accommodation. No scleral icterus. Extraocular muscles intact.  HEENT: Head atraumatic, normocephalic. Oropharynx and nasopharynx clear.  NECK:  Supple, no jugular venous distention. No thyroid enlargement, no tenderness.  LUNGS: Normal breath sounds bilaterally, no wheezing, rales,rhonchi or crepitation. No use of accessory muscles of respiration.  CARDIOVASCULAR: S1, S2 normal. No murmurs, rubs, or gallops.  ABDOMEN: Soft, non-tender, non-distended. Bowel sounds present. No organomegaly or mass.  EXTREMITIES: No pedal edema, cyanosis, or clubbing.  NEUROLOGIC: Cranial nerves II through XII are intact. Muscle strength 5/5 in all extremities. Sensation intact. Gait not checked.  PSYCHIATRIC: The patient is alert and oriented x 3.  SKIN: No obvious  rash, lesion, or ulcer.   DATA REVIEW:   CBC  Recent Labs Lab 05/31/15 0424  WBC 13.0*  HGB 11.8*  HCT 34.4*  PLT 147*    Chemistries   Recent Labs Lab 05/28/15 0333  05/31/15 0424  NA 138  < > 135  K 4.0  < > 3.7  CL 106  < > 106  CO2 24  < > 24  GLUCOSE 102*  < > 178*  BUN 20  < > 20  CREATININE 0.74  < > 0.97  CALCIUM 8.3*  < > 8.6*  MG 1.9  --   --   AST  --   < > 37  ALT  --   < > 41  ALKPHOS  --   < > 98  BILITOT  --   < > 1.0  < > = values in this interval not displayed.  Cardiac Enzymes No results for input(s): TROPONINI in the last 168 hours.  Microbiology Results  Results for orders placed or performed in visit on 10/31/13  Body fluid culture     Status: None   Collection Time: 11/01/13  2:10 PM  Result Value Ref Range Status   Micro Text Report   Final       SOURCE: abd fluid    COMMENT                   NO GROWTH AEROBICALLY/ANAEROBICALLY IN  4 DAYS   GRAM STAIN                FEW WHITE BLOOD CELLS   GRAM STAIN                MODERATE RED BLOOD CELLS   GRAM STAIN                NO ORGANISMS SEEN   ANTIBIOTIC                                                        RADIOLOGY:  Dg Cholangiogram Operative  05/30/2015  CLINICAL DATA:  Cholecystitis EXAM: INTRAOPERATIVE CHOLANGIOGRAM TECHNIQUE: Cholangiographic images from the C-arm fluoroscopic device were submitted for interpretation post-operatively. Please see the procedural report for the amount of contrast and the fluoroscopy time utilized. COMPARISON:  CT 05/27/2015 FINDINGS: No persistent filling defects in the common duct. However, the distal CBD is incompletely visualized. Intrahepatic ducts are incompletely visualized, appearing decompressed centrally. Contrast passes into the duodenum. : Negative for retained common duct stone in visualized portions of CBD Electronically Signed   By: Corlis Leak M.D.   On: 05/30/2015 14:10    EKG:   Orders placed or performed during the hospital  encounter of 05/27/15  . EKG 12-Lead  . EKG 12-Lead      Management plans discussed with the patient, family and they are in agreement.  CODE STATUS:     Code Status Orders        Start     Ordered   05/27/15 1116  Full code   Continuous     05/27/15 1115    Code Status History    Date Active Date Inactive Code Status Order ID Comments User Context   This patient has a current code status but no historical code status.      TOTAL TIME TAKING CARE OF THIS PATIENT: 40  minutes.   Discussed with family  Nicholette Dolson M.D on 05/31/2015 at 12:32 PM  Between 7am to 6pm - Pager - [phone]  After 6pm go to [email or web address] - password [email or web address] [facility]  [facility]  Office  [phone]  CC: Primary care physician; Megan Mans, MD

## 2015-05-31 NOTE — Progress Notes (Signed)
CC: POD # 1 Subjective: JP 245 ascitis No complaints, + PO Objective: Vital signs in last 24 hours: Temp:  [97.6 F (36.4 C)-100.6 F (38.1 C)] 97.6 F (36.4 C) (05/13 0506) Pulse Rate:  [48-90] 48 (05/13 0506) Resp:  [17-28] 20 (05/13 0506) BP: (122-151)/(64-79) 122/66 mmHg (05/13 0506) SpO2:  [92 %-99 %] 93 % (05/13 0506) Weight:  [108.863 kg (240 lb)] 108.863 kg (240 lb) (05/12 1132) Last BM Date: 05/29/15  Intake/Output from previous day: 05/12 0701 - 05/13 0700 In: 2440 [P.O.:480; I.V.:1960] Out: 395 [Drains:245; Blood:150] Intake/Output this shift: Total I/O In: 240 [P.O.:240] Out: 90 [Drains:90]  Physical exam: NAD Abd: soft, incisions c/d/i, no infection JP ascitis.  Lab Results: CBC   Recent Labs  05/31/15 0424  WBC 13.0*  HGB 11.8*  HCT 34.4*  PLT 147*   BMET  Recent Labs  05/29/15 0415 05/31/15 0424  NA 132* 135  K 3.5 3.7  CL 103 106  CO2 22 24  GLUCOSE 100* 178*  BUN 16 20  CREATININE 0.76 0.97  CALCIUM 7.6* 8.6*   PT/INR No results for input(s): LABPROT, INR in the last 72 hours. ABG No results for input(s): PHART, HCO3 in the last 72 hours.  Invalid input(s): PCO2, PO2  Studies/Results: Dg Cholangiogram Operative  05/30/2015  CLINICAL DATA:  Cholecystitis EXAM: INTRAOPERATIVE CHOLANGIOGRAM TECHNIQUE: Cholangiographic images from the C-arm fluoroscopic device were submitted for interpretation post-operatively. Please see the procedural report for the amount of contrast and the fluoroscopy time utilized. COMPARISON:  CT 05/27/2015 FINDINGS: No persistent filling defects in the common duct. However, the distal CBD is incompletely visualized. Intrahepatic ducts are incompletely visualized, appearing decompressed centrally. Contrast passes into the duodenum. : Negative for retained common duct stone in visualized portions of CBD Electronically Signed   By: Corlis Leak  Hassell M.D.   On: 05/30/2015 14:10    Anti-infectives: Anti-infectives    Start     Dose/Rate Route Frequency Ordered Stop   05/30/15 0600  ceFAZolin (ANCEF) IVPB 2g/100 mL premix     2 g 200 mL/hr over 30 Minutes Intravenous On call to O.R. 05/29/15 1519 05/30/15 1154      Assessment/Plan: Doing very well DC home w drain and F/U next Friday in my Office BTO No A/bs needed  Sterling Bigiego Pabon, MD, [facility]  05/31/2015

## 2015-06-02 ENCOUNTER — Telehealth: Payer: Self-pay | Admitting: Family Medicine

## 2015-06-02 LAB — SURGICAL PATHOLOGY

## 2015-06-02 NOTE — Telephone Encounter (Signed)
Please review, can you call him?-aa

## 2015-06-02 NOTE — Telephone Encounter (Signed)
Pt would like Dr. Sullivan LoneGilbert to return his call. Pt wanted to update Dr. Sullivan LoneGilbert about his recent surgery. Pt stated that he had to have his gallbladder removed. Thanks TNP

## 2015-06-04 NOTE — Telephone Encounter (Signed)
Spoke with patient. Patient just wanted to make sure we received records of his hospital visit and that he had his gallbladder taking out. He is doing better, he still has the bag for drainage attached, he has follow up set up with specialist for this. Advised patient if he needed anything to let us know-aa

## 2015-06-06 ENCOUNTER — Encounter: Payer: Self-pay | Admitting: Surgery

## 2015-06-06 ENCOUNTER — Ambulatory Visit (INDEPENDENT_AMBULATORY_CARE_PROVIDER_SITE_OTHER): Payer: Self-pay | Admitting: Surgery

## 2015-06-06 VITALS — BP 148/83 | HR 77 | Temp 98.3°F | Ht 70.0 in | Wt 231.4 lb

## 2015-06-06 DIAGNOSIS — Z09 Encounter for follow-up examination after completed treatment for conditions other than malignant neoplasm: Secondary | ICD-10-CM

## 2015-06-06 NOTE — Progress Notes (Signed)
S/p lap chole pt w cirrhosis and mild ascitis JP less 30cc day Pt is doing very well w/o complaints Taking PO, no fevers, no jaundice  PE NAD Abd: soft, NT, JP scant clear ascitis, removed. No infection  A/P doing well RTC prn F/U PCP/ GI for liver dz

## 2015-06-06 NOTE — Patient Instructions (Signed)

## 2015-06-19 ENCOUNTER — Encounter: Payer: Self-pay | Admitting: Family Medicine

## 2015-06-19 ENCOUNTER — Ambulatory Visit (INDEPENDENT_AMBULATORY_CARE_PROVIDER_SITE_OTHER): Payer: Self-pay | Admitting: Family Medicine

## 2015-06-19 VITALS — BP 132/78 | HR 86 | Temp 98.1°F | Resp 16 | Wt 233.0 lb

## 2015-06-19 DIAGNOSIS — K851 Biliary acute pancreatitis without necrosis or infection: Secondary | ICD-10-CM

## 2015-06-19 DIAGNOSIS — K7031 Alcoholic cirrhosis of liver with ascites: Secondary | ICD-10-CM

## 2015-06-19 DIAGNOSIS — Z9049 Acquired absence of other specified parts of digestive tract: Secondary | ICD-10-CM

## 2015-06-19 DIAGNOSIS — K812 Acute cholecystitis with chronic cholecystitis: Secondary | ICD-10-CM

## 2015-06-19 NOTE — Patient Instructions (Signed)
Metamucil daily

## 2015-06-19 NOTE — Progress Notes (Signed)
Patient ID: Timothy Johns, male   DOB: 02-08-1965, 50 y.o.   MRN: [id number]    Subjective:  HPI Admission- 05/27/15 Discharged- 05/31/15  Diagnoses-  abdominal pain RUQ Gallstones Cirrhosis of the liver without ascites acute pancreatitis.  Pt went to the ER for abdominal pain in what he thought was his liver area. They did labs and told him he had pancreatitis and did u/s and CT, found gall stones. Then pt had cholecystectomy on 05/30/15 and went home the next day. He is feeling much better he no longer has a drainage bag, he had that taken out about a week after the surgery. Pt just want to get Dr. Sullivan Lone up to date. He was struggling emotionally after being in the hospital, which is why he made the appt in the first place but he is feeling better now and wants to discuss his health with Dr. Sullivan Lone.     Prior to Admission medications   Medication Sig Start Date End Date Taking? Authorizing Provider  diphenhydrAMINE (BENADRYL) 25 mg capsule Take 25 mg by mouth at bedtime as needed for sleep.    Yes Historical Provider, MD  Multiple Vitamin (MULTIVITAMIN) tablet Take 1 tablet by mouth daily.   Yes Historical Provider, MD  nicotine (NICODERM CQ - DOSED IN MG/24 HR) 7 mg/24hr patch Place 1 patch (7 mg total) onto the skin daily. 05/31/15  Yes Katharina Caper, MD  oxyCODONE (OXY IR/ROXICODONE) 5 MG immediate release tablet Take 1 tablet (5 mg total) by mouth every 4 (four) hours as needed for severe pain. 05/31/15  Yes Katharina Caper, MD  spironolactone (ALDACTONE) 50 MG tablet Take 50 mg by mouth daily.    Yes Historical Provider, MD  thiamine 100 MG tablet Take 100 mg by mouth daily.   Yes Historical Provider, MD    Patient Active Problem List   Diagnosis Date Noted  . Acute on chronic cholecystitis 05/31/2015  . Cholelithiasis 05/31/2015  . Leukocytosis 05/31/2015  . Hyperbilirubinemia 05/31/2015  . Dehydration 05/31/2015  . Hypomagnesemia 05/31/2015  . Tobacco abuse counseling  05/31/2015  . Acute pancreatitis 05/27/2015  . History of syncope 05/13/2014  . Tobacco use 05/13/2014  . Alcoholic cirrhosis of liver with ascites (HCC) 05/13/2014  . Alcoholism (HCC) 05/13/2014  . Obesity 05/13/2014  . Insomnia 05/13/2014    Past Medical History  Diagnosis Date  . Cirrhosis (HCC)   . Hypertension     Social History   Social History  . Marital Status: Single    Spouse Name: N/A  . Number of Children: N/A  . Years of Education: N/A   Occupational History  . Not on file.   Social History Main Topics  . Smoking status: Current Some Day Smoker -- 0.50 packs/day for 20 years    Types: Cigarettes  . Smokeless tobacco: Never Used     Comment: 1-3 packs a week, he is wearing a patch  . Alcohol Use: No     Comment: Hx of alcohol abuse, quit 8 months ago  . Drug Use: No     Comment: Hx of marijuana use  . Sexual Activity: Not on file   Other Topics Concern  . Not on file   Social History Narrative    Allergies  Allergen Reactions  . Tylenol [Acetaminophen] Other (See Comments)    Pt. states PCP told him not to take because of his liver.    Review of Systems  Constitutional: Negative.   HENT: Negative.   Eyes: Negative.  Respiratory: Negative.   Cardiovascular: Negative.   Gastrointestinal: Negative.   Genitourinary: Negative.   Musculoskeletal: Negative.   Skin: Negative.   Neurological: Negative.   Endo/Heme/Allergies: Negative.   Psychiatric/Behavioral: Negative.     Immunization History  Administered Date(s) Administered  . Influenza,inj,Quad PF,36+ Mos 12/25/2014   Objective:  BP 132/78 mmHg  Pulse 86  Temp(Src) 98.1 F (36.7 C) (Oral)  Resp 16  Wt 233 lb (105.688 kg)  Physical Exam  Constitutional: He is well-developed, well-nourished, and in no distress.  HENT:  Head: Normocephalic and atraumatic.  Right Ear: External ear normal.  Left Ear: External ear normal.  Nose: Nose normal.  Eyes: Conjunctivae and EOM are  normal. Pupils are equal, round, and reactive to light.  Neck: Normal range of motion. Neck supple.  Cardiovascular: Normal rate, regular rhythm, normal heart sounds and intact distal pulses.   Pulmonary/Chest: Effort normal and breath sounds normal.  Abdominal: Soft. Bowel sounds are normal.  laparoscopic surgical scars  Musculoskeletal: Normal range of motion.  Neurological: He is alert.  Skin: Skin is warm and dry.  Psychiatric: Mood, memory, affect and judgment normal.    Lab Results  Component Value Date   WBC 13.0* 05/31/2015   HGB 11.8* 05/31/2015   HCT 34.4* 05/31/2015   PLT 147* 05/31/2015   GLUCOSE 178* 05/31/2015   TSH 7.88* 10/30/2013   INR 1.08 05/27/2015    CMP     Component Value Date/Time   NA 135 05/31/2015 0424   NA 140 03/25/2015 1637   NA 137 11/06/2013 1151   K 3.7 05/31/2015 0424   K 3.4* 11/06/2013 1151   CL 106 05/31/2015 0424   CL 105 11/06/2013 1151   CO2 24 05/31/2015 0424   CO2 23 11/06/2013 1151   GLUCOSE 178* 05/31/2015 0424   GLUCOSE 104* 03/25/2015 1637   GLUCOSE 111* 11/06/2013 1151   BUN 20 05/31/2015 0424   BUN 26* 03/25/2015 1637   BUN 47* 11/06/2013 1151   CREATININE 0.97 05/31/2015 0424   CREATININE 1.6* 12/03/2013   CREATININE 1.89* 11/06/2013 1151   CALCIUM 8.6* 05/31/2015 0424   CALCIUM 7.7* 11/06/2013 1151   PROT 6.5 05/31/2015 0424   PROT 7.5 03/25/2015 1637   PROT 4.9* 11/06/2013 1151   ALBUMIN 3.1* 05/31/2015 0424   ALBUMIN 4.5 03/25/2015 1637   ALBUMIN 2.8* 11/06/2013 1151   AST 37 05/31/2015 0424   AST 121* 11/06/2013 1151   ALT 41 05/31/2015 0424   ALT 92* 11/06/2013 1151   ALKPHOS 98 05/31/2015 0424   ALKPHOS 233* 11/06/2013 1151   BILITOT 1.0 05/31/2015 0424   BILITOT 0.8 03/25/2015 1637   BILITOT 20.5* 11/06/2013 1151   GFRNONAA >60 05/31/2015 0424   GFRNONAA 41* 11/06/2013 1151   GFRAA >60 05/31/2015 0424   GFRAA 49* 11/06/2013 1151    Assessment and Plan :  1. Alcoholic cirrhosis of liver with  ascites (HCC) Stable. Patient sober for over one year.  2. Acute on chronic cholecystitis   3. Acute biliary pancreatitis without infection or necrosis  4. S/P cholecystectomy Pt recovering well.    Patient was seen and examined by Dr. Julieanne Mansonichard , and noted scribed by Dimas ChyleBrittany Byrd, CMA  Julieanne Mansonichard  MD [facility] 06/19/2015 3:25 PM

## 2015-08-04 ENCOUNTER — Encounter: Payer: Self-pay | Admitting: Surgery

## 2015-10-02 ENCOUNTER — Telehealth: Payer: Self-pay | Admitting: Family Medicine

## 2015-10-02 MED ORDER — SPIRONOLACTONE 50 MG PO TABS: 50.0000 mg | ORAL_TABLET | Freq: Every day | ORAL | 1 refills | Status: DC

## 2015-10-02 NOTE — Telephone Encounter (Signed)
Please review, thank you-aa

## 2015-10-02 NOTE — Telephone Encounter (Signed)
Pt contacted office for refill request on the following medications: spironolactone (ALDACTONE) 50 MG tablet to [facility].  Last Written: 12/03/13 with 5 refills Last OV: 06/19/15 Pt was advised that Dr. Sullivan LoneGilbert is out of the office this week and request that another provider send in the RX. Pt stated that he is starting to have fluid build up and this medication helps with that. Please advise. Thanks TNP

## 2016-01-09 ENCOUNTER — Encounter: Payer: Self-pay | Admitting: Emergency Medicine

## 2016-01-09 ENCOUNTER — Inpatient Hospital Stay
Admission: EM | Admit: 2016-01-09 | Discharge: 2016-01-11 | DRG: 439 | Disposition: A | Discharge status: Home / Self Care | Payer: Self-pay | Attending: Internal Medicine | Admitting: Internal Medicine

## 2016-01-09 ENCOUNTER — Emergency Department: Payer: Self-pay

## 2016-01-09 DIAGNOSIS — F1010 Alcohol abuse, uncomplicated: Secondary | ICD-10-CM | POA: Diagnosis present

## 2016-01-09 DIAGNOSIS — Z8249 Family history of ischemic heart disease and other diseases of the circulatory system: Secondary | ICD-10-CM

## 2016-01-09 DIAGNOSIS — I10 Essential (primary) hypertension: Secondary | ICD-10-CM | POA: Diagnosis present

## 2016-01-09 DIAGNOSIS — E860 Dehydration: Secondary | ICD-10-CM | POA: Diagnosis present

## 2016-01-09 DIAGNOSIS — E872 Acidosis, unspecified: Secondary | ICD-10-CM

## 2016-01-09 DIAGNOSIS — R1013 Epigastric pain: Secondary | ICD-10-CM

## 2016-01-09 DIAGNOSIS — F329 Major depressive disorder, single episode, unspecified: Secondary | ICD-10-CM | POA: Diagnosis present

## 2016-01-09 DIAGNOSIS — K852 Alcohol induced acute pancreatitis without necrosis or infection: Principal | ICD-10-CM | POA: Diagnosis present

## 2016-01-09 DIAGNOSIS — K859 Acute pancreatitis without necrosis or infection, unspecified: Secondary | ICD-10-CM | POA: Diagnosis present

## 2016-01-09 DIAGNOSIS — F17210 Nicotine dependence, cigarettes, uncomplicated: Secondary | ICD-10-CM | POA: Diagnosis present

## 2016-01-09 DIAGNOSIS — Z886 Allergy status to analgesic agent status: Secondary | ICD-10-CM

## 2016-01-09 DIAGNOSIS — Z79899 Other long term (current) drug therapy: Secondary | ICD-10-CM

## 2016-01-09 DIAGNOSIS — K7030 Alcoholic cirrhosis of liver without ascites: Secondary | ICD-10-CM | POA: Diagnosis present

## 2016-01-09 LAB — COMPREHENSIVE METABOLIC PANEL
ALBUMIN: 4.1 g/dL (ref 3.5–5.0)
ALT: 66 U/L — AB (ref 17–63)
AST: 57 U/L — AB (ref 15–41)
Alkaline Phosphatase: 103 U/L (ref 38–126)
Anion gap: 15 (ref 5–15)
BUN: 14 mg/dL (ref 6–20)
CHLORIDE: 102 mmol/L (ref 101–111)
CO2: 18 mmol/L — AB (ref 22–32)
CREATININE: 0.98 mg/dL (ref 0.61–1.24)
Calcium: 10.7 mg/dL — ABNORMAL HIGH (ref 8.9–10.3)
GFR calc Af Amer: >60 mL/min (ref >60)
GFR calc non Af Amer: >60 mL/min (ref >60)
Glucose, Bld: 148 mg/dL — ABNORMAL HIGH (ref 65–99)
POTASSIUM: 3.7 mmol/L (ref 3.5–5.1)
SODIUM: 135 mmol/L (ref 135–145)
Total Bilirubin: 1.1 mg/dL (ref 0.3–1.2)
Total Protein: 7.6 g/dL (ref 6.5–8.1)

## 2016-01-09 LAB — CBC WITH DIFFERENTIAL/PLATELET
BASOS ABS: 0.1 10*3/uL (ref 0–0.1)
BASOS PCT: 0 %
EOS ABS: 0.1 10*3/uL (ref 0–0.7)
EOS PCT: 0 %
HCT: 43.8 % (ref 40.0–52.0)
Hemoglobin: 15.2 g/dL (ref 13.0–18.0)
LYMPHS PCT: 10 %
Lymphs Abs: 2 10*3/uL (ref 1.0–3.6)
MCH: 34.1 pg — ABNORMAL HIGH (ref 26.0–34.0)
MCHC: 34.8 g/dL (ref 32.0–36.0)
MCV: 98 fL (ref 80.0–100.0)
MONO ABS: 1.9 10*3/uL — AB (ref 0.2–1.0)
Monocytes Relative: 9 %
Neutro Abs: 16.2 10*3/uL — ABNORMAL HIGH (ref 1.4–6.5)
Neutrophils Relative %: 81 %
PLATELETS: 247 10*3/uL (ref 150–440)
RBC: 4.47 MIL/uL (ref 4.40–5.90)
RDW: 14.4 % (ref 11.5–14.5)
WBC: 20.3 10*3/uL — AB (ref 3.8–10.6)

## 2016-01-09 LAB — PROTIME-INR
INR: 0.98
PROTHROMBIN TIME: 13 s (ref 11.4–15.2)

## 2016-01-09 LAB — LIPASE, BLOOD: Lipase: 1323 U/L — ABNORMAL HIGH (ref 11–51)

## 2016-01-09 MED ORDER — DIPHENHYDRAMINE HCL 50 MG/ML IJ SOLN
25.0000 mg | Freq: Once | INTRAMUSCULAR | Status: AC
  Administered 2016-01-09: 25 mg via INTRAVENOUS

## 2016-01-09 MED ORDER — SODIUM CHLORIDE 0.9 % IV BOLUS (SEPSIS)
1000.0000 mL | Freq: Once | INTRAVENOUS | Status: AC
  Administered 2016-01-09: 1000 mL via INTRAVENOUS

## 2016-01-09 MED ORDER — HYDROMORPHONE HCL 1 MG/ML IJ SOLN
1.0000 mg | INTRAMUSCULAR | Status: DC | PRN
  Administered 2016-01-09 – 2016-01-10 (×4): 1 mg via INTRAVENOUS
  Filled 2016-01-09 (×4): qty 1

## 2016-01-09 MED ORDER — THIAMINE HCL 100 MG/ML IJ SOLN
100.0000 mg | Freq: Every day | INTRAMUSCULAR | Status: DC
  Administered 2016-01-10: 100 mg via INTRAVENOUS
  Filled 2016-01-09: qty 2

## 2016-01-09 MED ORDER — PROMETHAZINE HCL 25 MG/ML IJ SOLN
12.5000 mg | Freq: Once | INTRAMUSCULAR | Status: AC
  Administered 2016-01-09: 12.5 mg via INTRAVENOUS

## 2016-01-09 MED ORDER — ADULT MULTIVITAMIN W/MINERALS CH
1.0000 | ORAL_TABLET | Freq: Every day | ORAL | Status: DC
Start: 2016-01-09 — End: 2016-01-11
  Administered 2016-01-11: 1 via ORAL
  Filled 2016-01-09: qty 1

## 2016-01-09 MED ORDER — DIPHENHYDRAMINE HCL 50 MG/ML IJ SOLN
25.0000 mg | Freq: Once | INTRAMUSCULAR | Status: DC
  Filled 2016-01-09: qty 1

## 2016-01-09 MED ORDER — IOPAMIDOL (ISOVUE-300) INJECTION 61%
100.0000 mL | Freq: Once | INTRAVENOUS | Status: AC | PRN
  Administered 2016-01-09: 100 mL via INTRAVENOUS

## 2016-01-09 MED ORDER — LORAZEPAM 2 MG/ML IJ SOLN
1.0000 mg | Freq: Four times a day (QID) | INTRAMUSCULAR | Status: DC | PRN
  Administered 2016-01-10 (×2): 1 mg via INTRAVENOUS
  Filled 2016-01-09 (×2): qty 1

## 2016-01-09 MED ORDER — VITAMIN B-1 100 MG PO TABS
100.0000 mg | ORAL_TABLET | Freq: Every day | ORAL | Status: DC
  Administered 2016-01-11: 100 mg via ORAL
  Filled 2016-01-09: qty 1

## 2016-01-09 MED ORDER — ONDANSETRON HCL 4 MG/2ML IJ SOLN
4.0000 mg | Freq: Four times a day (QID) | INTRAMUSCULAR | Status: DC | PRN
  Administered 2016-01-10: 4 mg via INTRAVENOUS
  Filled 2016-01-09: qty 2

## 2016-01-09 MED ORDER — OXYCODONE HCL 5 MG PO TABS: 5.0000 mg | ORAL_TABLET | ORAL | Status: DC | PRN

## 2016-01-09 MED ORDER — FOLIC ACID 1 MG PO TABS
1.0000 mg | ORAL_TABLET | Freq: Every day | ORAL | Status: DC
  Administered 2016-01-11: 1 mg via ORAL
  Filled 2016-01-09: qty 1

## 2016-01-09 MED ORDER — LORAZEPAM 1 MG PO TABS: 1.0000 mg | ORAL_TABLET | Freq: Four times a day (QID) | ORAL | Status: DC | PRN

## 2016-01-09 MED ORDER — FENTANYL CITRATE (PF) 100 MCG/2ML IJ SOLN
100.0000 ug | INTRAMUSCULAR | Status: DC | PRN
  Administered 2016-01-09: 100 ug via INTRAVENOUS
  Filled 2016-01-09: qty 2

## 2016-01-09 MED ORDER — SODIUM CHLORIDE 0.9 % IV SOLN
INTRAVENOUS | Status: DC
  Administered 2016-01-09 – 2016-01-10 (×3): via INTRAVENOUS

## 2016-01-09 MED ORDER — ONDANSETRON HCL 4 MG PO TABS
4.0000 mg | ORAL_TABLET | Freq: Four times a day (QID) | ORAL | Status: DC | PRN
  Administered 2016-01-11: 4 mg via ORAL
  Filled 2016-01-09: qty 1

## 2016-01-09 MED ORDER — SODIUM CHLORIDE 0.9 % IV SOLN
Freq: Once | INTRAVENOUS | Status: AC
  Administered 2016-01-10: 04:00:00 via INTRAVENOUS

## 2016-01-09 MED ORDER — MORPHINE SULFATE (PF) 4 MG/ML IV SOLN
4.0000 mg | INTRAVENOUS | Status: DC | PRN
  Administered 2016-01-09 – 2016-01-10 (×4): 4 mg via INTRAVENOUS
  Filled 2016-01-09 (×4): qty 1

## 2016-01-09 MED ORDER — ENOXAPARIN SODIUM 40 MG/0.4ML ~~LOC~~ SOLN
40.0000 mg | SUBCUTANEOUS | Status: DC
  Administered 2016-01-09 – 2016-01-10 (×2): 40 mg via SUBCUTANEOUS
  Filled 2016-01-09 (×2): qty 0.4

## 2016-01-09 NOTE — ED Notes (Signed)
Pt states hx of ETOH abuse, states he drank 1 quart of liquor last night and began to have upper abd pain, states last drink was 0300, pt states nausea, awake and alert

## 2016-01-09 NOTE — ED Notes (Signed)
Called to CT scan by Santina Evansatherine, CT tech. Pt with hives noted to left arm. Pt denies itching. Pt denies any difficulty breathing or shortness of breath. Pt denies allergy to morphine. Pt states he does not think he is allergic to phenergan. Dr. Roxan Hockeyobinson notified and orders received for benadryl.

## 2016-01-09 NOTE — ED Triage Notes (Signed)
Patient has center upper abd pain with a gradual onset...worsening to a severe pain. States it is just like his previous pancreatitis . Patient does not have a gallbladder.   PCP Sullivan Lonegilbert MD

## 2016-01-09 NOTE — ED Provider Notes (Signed)
[facility] Emergency Department Provider Note    First MD Initiated Contact with Patient 01/09/16 1749     (approximate)  I have reviewed the triage vital signs and the nursing notes.   HISTORY  Chief Complaint Abdominal Pain    HPI Timothy Johns is a 50 y.o. male history of alcoholic cirrhosis and previous episode of pancreatitis presents with acute epigastric pain radiating to his back that started this morning. Patient states he drink 1 quart of liquor last night which precipitated his abdominal pain. His last drink was at 3 AM. States he is having nausea but only one episode of self-induced vomiting without improvement in symptoms. No blood in his vomit   Past Medical History:  Diagnosis Date  . Cirrhosis (HCC)   . Hypertension    Family History  Problem Relation Age of Onset  . Heart disease Mother   . Vaginal cancer Mother   . Arthritis Father   . Diabetes Brother   . Heart disease Maternal Grandfather     dx in her 7090s   Past Surgical History:  Procedure Laterality Date  . CHOLECYSTECTOMY N/A 05/30/2015   Procedure: LAPAROSCOPIC CHOLECYSTECTOMY WITH INTRAOPERATIVE CHOLANGIOGRAM;  Surgeon: Leafy Roiego F Pabon, MD;  Location: [facility] ORS;  Service: General;  Laterality: N/A;   Patient Active Problem List   Diagnosis Date Noted  . Acute on chronic cholecystitis 05/31/2015  . Cholelithiasis 05/31/2015  . Leukocytosis 05/31/2015  . Hyperbilirubinemia 05/31/2015  . Dehydration 05/31/2015  . Hypomagnesemia 05/31/2015  . Tobacco abuse counseling 05/31/2015  . Acute pancreatitis 05/27/2015  . History of syncope 05/13/2014  . Tobacco use 05/13/2014  . Alcoholic cirrhosis of liver with ascites (HCC) 05/13/2014  . Alcoholism (HCC) 05/13/2014  . Obesity 05/13/2014  . Insomnia 05/13/2014      Prior to Admission medications   Medication Sig Start Date End Date Taking? Authorizing Provider  diphenhydrAMINE (BENADRYL) 25 mg capsule Take 25 mg by  mouth at bedtime as needed for sleep. Reported on 06/19/2015    Historical Provider, MD  Multiple Vitamin (MULTIVITAMIN) tablet Take 1 tablet by mouth daily.    Historical Provider, MD  nicotine (NICODERM CQ - DOSED IN MG/24 HR) 7 mg/24hr patch Place 1 patch (7 mg total) onto the skin daily. 05/31/15   Katharina Caperima Vaickute, MD  oxyCODONE (OXY IR/ROXICODONE) 5 MG immediate release tablet Take 1 tablet (5 mg total) by mouth every 4 (four) hours as needed for severe pain. Patient not taking: Reported on 06/19/2015 05/31/15   Katharina Caperima Vaickute, MD  spironolactone (ALDACTONE) 50 MG tablet Take 1 tablet (50 mg total) by mouth daily. Reported on 06/19/2015 10/02/15   Malva Limesonald E Fisher, MD  thiamine 100 MG tablet Take 100 mg by mouth daily.    Historical Provider, MD  Vitamin D, Cholecalciferol, 400 units TABS Take by mouth.    Historical Provider, MD    Allergies Tylenol [acetaminophen]    Social History Social History  Substance Use Topics  . Smoking status: Current Some Day Smoker    Packs/day: 0.50    Years: 20.00    Types: Cigarettes  . Smokeless tobacco: Never Used     Comment: 1-3 packs a week, he is wearing a patch  . Alcohol use No     Comment: Hx of alcohol abuse, quit 8 months ago    Review of Systems Patient denies headaches, rhinorrhea, blurry vision, numbness, shortness of breath, chest pain, edema, cough, abdominal pain, nausea, vomiting, diarrhea, dysuria, fevers, rashes  or hallucinations unless otherwise stated above in HPI. ____________________________________________   PHYSICAL EXAM:  VITAL SIGNS: Vitals:   01/09/16 1749  BP: (!) 150/74  Pulse: 72  Resp: 18  Temp: 98 F (36.7 C)    Constitutional: Alert and oriented.  in no acute distress. Eyes: Conjunctivae are normal. PERRL. EOMI. Head: Atraumatic. Nose: No congestion/rhinnorhea. Mouth/Throat: Mucous membranes are moist.  Oropharynx non-erythematous. Neck: No stridor. Painless ROM. No cervical spine tenderness to  palpation Hematological/Lymphatic/Immunilogical: No cervical lymphadenopathy. Cardiovascular: Normal rate, regular rhythm. Grossly normal heart sounds.  Good peripheral circulation. Respiratory: Normal respiratory effort.  No retractions. Lungs CTAB. Gastrointestinal: + epigastric ttp. No distention. No abdominal bruits. No CVA tenderness. Musculoskeletal: No lower extremity tenderness nor edema.  No joint effusions. Neurologic:  Normal speech and language. No gross focal neurologic deficits are appreciated. No gait instability. Skin:  Skin is warm, dry and intact. No rash noted. Psychiatric: Mood and affect are normal. Speech and behavior are normal.  ____________________________________________   LABS (all labs ordered are listed, but only abnormal results are displayed)  Results for orders placed or performed during the hospital encounter of 01/09/16 (from the past 24 hour(s))  CBC with Differential/Platelet     Status: Abnormal   Collection Time: 01/09/16  5:52 PM  Result Value Ref Range   WBC 20.3 (H) 3.8 - 10.6 K/uL   RBC 4.47 4.40 - 5.90 MIL/uL   Hemoglobin 15.2 13.0 - 18.0 g/dL   HCT 40.9 81.1 - 91.4 %   MCV 98.0 80.0 - 100.0 fL   MCH 34.1 (H) 26.0 - 34.0 pg   MCHC 34.8 32.0 - 36.0 g/dL   RDW 78.2 95.6 - 21.3 %   Platelets 247 150 - 440 K/uL   Neutrophils Relative % 81 %   Neutro Abs 16.2 (H) 1.4 - 6.5 K/uL   Lymphocytes Relative 10 %   Lymphs Abs 2.0 1.0 - 3.6 K/uL   Monocytes Relative 9 %   Monocytes Absolute 1.9 (H) 0.2 - 1.0 K/uL   Eosinophils Relative 0 %   Eosinophils Absolute 0.1 0 - 0.7 K/uL   Basophils Relative 0 %   Basophils Absolute 0.1 0 - 0.1 K/uL  Comprehensive metabolic panel     Status: Abnormal   Collection Time: 01/09/16  5:52 PM  Result Value Ref Range   Sodium 135 135 - 145 mmol/L   Potassium 3.7 3.5 - 5.1 mmol/L   Chloride 102 101 - 111 mmol/L   CO2 18 (L) 22 - 32 mmol/L   Glucose, Bld 148 (H) 65 - 99 mg/dL   BUN 14 6 - 20 mg/dL    Creatinine, Ser 0.86 0.61 - 1.24 mg/dL   Calcium 57.8 (H) 8.9 - 10.3 mg/dL   Total Protein 7.6 6.5 - 8.1 g/dL   Albumin 4.1 3.5 - 5.0 g/dL   AST 57 (H) 15 - 41 U/L   ALT 66 (H) 17 - 63 U/L   Alkaline Phosphatase 103 38 - 126 U/L   Total Bilirubin 1.1 0.3 - 1.2 mg/dL   GFR calc non Af Amer >60 >60 mL/min   GFR calc Af Amer >60 >60 mL/min   Anion gap 15 5 - 15   ____________________________________________ ____________________________________________  RADIOLOGY  I personally reviewed all radiographic images ordered to evaluate for the above acute complaints and reviewed radiology reports and findings.  These findings were personally discussed with the patient.  Please see medical record for radiology report. ____________________________________________   PROCEDURES  Procedure(s) performed:  Procedures    Critical Care performed: no ____________________________________________   INITIAL IMPRESSION / ASSESSMENT AND PLAN / ED COURSE  Pertinent labs & imaging results that were available during my care of the patient were reviewed by me and considered in my medical decision making (see chart for details).  DDX: Pancreatitis, necrotizing pancreatitis, hemorrhagic pancreatitis, hepatitis, duodenitis  Timothy Johns is a 50 y.o. who presents to the ED with history of alcohol abuse and pancreatitis presents with epigastric pain that started after drinking alcohol. Patient with stable vital signs but does appear uncomfortable. Laboratory evaluation shows evidence of acute leukocytosis with acute metabolic acidosis.  There is also evidence of acute hypercalcemia. We'll provide IV fluids for resuscitation as well as order CT imaging to evaluate for any evidence of accompanying pancreatitis.  The patient will be placed on continuous pulse oximetry and telemetry for monitoring.  Laboratory evaluation will be sent to evaluate for the above complaints.     Clinical Course as of Jan 08 1938   Fri Jan 09, 2016  [id number] Patient with CT evidence of acute pancreatitis without necrotizing or hemorrhagic component. Patient with persistent 7 out of 10 pain status post several doses of IV pain medication. Patient receiving IV fluids. Based on his persistent pain and acute pancreatitis do feel patient will require admission for further evaluation and management.  [PR]    Clinical Course User Index [PR] Willy EddyPatrick Arpan Eskelson, MD     ____________________________________________   FINAL CLINICAL IMPRESSION(S) / ED DIAGNOSES  Final diagnoses:  Epigastric pain  Alcohol-induced acute pancreatitis without infection or necrosis  Metabolic acidosis      NEW MEDICATIONS STARTED DURING THIS VISIT:  New Prescriptions   No medications on file     Note:  This document was prepared using Dragon voice recognition software and may include unintentional dictation errors.    Willy EddyPatrick Kassidy Dockendorf, MD 01/09/16 [phone]

## 2016-01-09 NOTE — H&P (Signed)
[facility] [place] [facility]   PATIENT NAME: Timothy Johns    MR#:  [id number]  DATE OF BIRTH:  20-Sep-1965  DATE OF ADMISSION:  01/09/2016  PRIMARY CARE PHYSICIAN: Megan Mansichard Gilbert Jr, MD   REQUESTING/REFERRING PHYSICIAN: Roxan Hockeyobinson MD  CHIEF COMPLAINT:   Chief Complaint  Patient presents with  . Abdominal Pain    HISTORY OF PRESENT ILLNESS: Timothy Rolleratrick Kon  is a 50 y.o. 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 Patient reports the pain started this morning. Severe and sharp and epigastric in nature. He has not had any nausea or vomiting or diarrhea. PAST MEDICAL HISTORY:   Past Medical History:  Diagnosis Date  . Cirrhosis (HCC)   . Hypertension     PAST SURGICAL HISTORY: Past Surgical History:  Procedure Laterality Date  . CHOLECYSTECTOMY N/A 05/30/2015   Procedure: LAPAROSCOPIC CHOLECYSTECTOMY WITH INTRAOPERATIVE CHOLANGIOGRAM;  Surgeon: Leafy Roiego F Pabon, MD;  Location: [facility] ORS;  Service: General;  Laterality: N/A;    SOCIAL HISTORY:  Social History  Substance Use Topics  . Smoking status: Current Some Day Smoker    Packs/day: 0.50    Years: 20.00    Types: Cigarettes  . Smokeless tobacco: Never Used     Comment: 1-3 packs a week, he is wearing a patch  . Alcohol use No     Comment: Hx of alcohol abuse, quit 8 months ago    FAMILY HISTORY:  Family History  Problem Relation Age of Onset  . Heart disease Mother   . Vaginal cancer Mother   . Arthritis Father   . Diabetes Brother   . Heart disease Maternal Grandfather     dx in her 90s    DRUG ALLERGIES:  Allergies  Allergen Reactions  . Tylenol [Acetaminophen] Other (See Comments)    Pt. states PCP told him not to take because of his liver.    REVIEW OF SYSTEMS:   CONSTITUTIONAL: No fever, fatigue or weakness.  EYES: No blurred or double vision.  EARS, NOSE, AND THROAT: No tinnitus or ear pain.   RESPIRATORY: No cough, shortness of breath, wheezing or hemoptysis.  CARDIOVASCULAR: No chest pain, orthopnea, edema.  GASTROINTESTINAL: Positive nausea, vomiting, diarrhea or positive abdominal pain.  GENITOURINARY: No dysuria, hematuria.  ENDOCRINE: No polyuria, nocturia,  HEMATOLOGY: No anemia, easy bruising or bleeding SKIN: No rash or lesion. MUSCULOSKELETAL: No joint pain or arthritis.   NEUROLOGIC: No tingling, numbness, weakness.  PSYCHIATRY: No anxiety or depression.   MEDICATIONS AT HOME:  Prior to Admission medications   Medication Sig Start Date End Date Taking? Authorizing Provider  diphenhydrAMINE (BENADRYL) 25 mg capsule Take 25 mg by mouth at bedtime as needed for sleep. Reported on 06/19/2015    Historical Provider, MD  Multiple Vitamin (MULTIVITAMIN) tablet Take 1 tablet by mouth daily.    Historical Provider, MD  nicotine (NICODERM CQ - DOSED IN MG/24 HR) 7 mg/24hr patch Place 1 patch (7 mg total) onto the skin daily. 05/31/15   Katharina Caperima Vaickute, MD  oxyCODONE (OXY IR/ROXICODONE) 5 MG immediate release tablet Take 1 tablet (5 mg total) by mouth every 4 (four) hours as needed for severe pain. Patient not taking: Reported on 06/19/2015 05/31/15   Katharina Caperima Vaickute, MD  spironolactone (ALDACTONE) 50 MG tablet Take 1 tablet (50 mg total) by mouth daily. Reported on 06/19/2015 10/02/15   Malva Limesonald E Fisher, MD  thiamine 100 MG tablet Take 100 mg  by mouth daily.    Historical Provider, MD  Vitamin D, Cholecalciferol, 400 units TABS Take by mouth.    Historical Provider, MD      PHYSICAL EXAMINATION:   VITAL SIGNS: Blood pressure (!) 162/71, pulse 63, temperature 98 F (36.7 C), temperature source Oral, resp. rate 16, height 5\' 10"  (1.778 m), weight 240 lb (108.9 kg), SpO2 97 %.  GENERAL:  50 y.o.-year-old patient lying in the bed with no acute distress.  EYES: Pupils equal, round, reactive to light and accommodation. No scleral icterus. Extraocular muscles intact.  HEENT: Head atraumatic,  normocephalic. Oropharynx and nasopharynx clear.  NECK:  Supple, no jugular venous distention. No thyroid enlargement, no tenderness.  LUNGS: Normal breath sounds bilaterally, no wheezing, rales,rhonchi or crepitation. No use of accessory muscles of respiration.  CARDIOVASCULAR: S1, S2 normal. No murmurs, rubs, or gallops.  ABDOMEN: Soft, Epigastric tenderness, nondistended. Bowel sounds present. No organomegaly or mass.  EXTREMITIES: No pedal edema, cyanosis, or clubbing.  NEUROLOGIC: Cranial nerves II through XII are intact. Muscle strength 5/5 in all extremities. Sensation intact. Gait not checked.  PSYCHIATRIC: The patient is alert and oriented x 3.  SKIN: No obvious rash, lesion, or ulcer.   LABORATORY PANEL:   CBC  Recent Labs Lab 01/09/16 1752  WBC 20.3*  HGB 15.2  HCT 43.8  PLT 247  MCV 98.0  MCH 34.1*  MCHC 34.8  RDW 14.4  LYMPHSABS 2.0  MONOABS 1.9*  EOSABS 0.1  BASOSABS 0.1   ------------------------------------------------------------------------------------------------------------------  Chemistries   Recent Labs Lab 01/09/16 1752  NA 135  K 3.7  CL 102  CO2 18*  GLUCOSE 148*  BUN 14  CREATININE 0.98  CALCIUM 10.7*  AST 57*  ALT 66*  ALKPHOS 103  BILITOT 1.1   ------------------------------------------------------------------------------------------------------------------ estimated creatinine clearance is 112.7 mL/min (by C-G formula based on SCr of 0.98 mg/dL). ------------------------------------------------------------------------------------------------------------------ No results for input(s): TSH, T4TOTAL, T3FREE, THYROIDAB in the last 72 hours.  Invalid input(s): FREET3   Coagulation profile  Recent Labs Lab 01/09/16 1752  INR 0.98   ------------------------------------------------------------------------------------------------------------------- No results for input(s): DDIMER in the last 72  hours. -------------------------------------------------------------------------------------------------------------------  Cardiac Enzymes No results for input(s): CKMB, TROPONINI, MYOGLOBIN in the last 168 hours.  Invalid input(s): CK ------------------------------------------------------------------------------------------------------------------ Invalid input(s): POCBNP  ---------------------------------------------------------------------------------------------------------------  Urinalysis    Component Value Date/Time   COLORURINE AMBER (A) 05/27/2015 0739   APPEARANCEUR HAZY (A) 05/27/2015 0739   APPEARANCEUR Clear 11/05/2013 0416   LABSPEC 1.033 (H) 05/27/2015 0739   LABSPEC 1.005 11/05/2013 0416   PHURINE 5.0 05/27/2015 0739   GLUCOSEU NEGATIVE 05/27/2015 0739   GLUCOSEU Negative 11/05/2013 0416   HGBUR NEGATIVE 05/27/2015 0739   BILIRUBINUR NEGATIVE 05/27/2015 0739   BILIRUBINUR 1+ 11/05/2013 0416   KETONESUR TRACE (A) 05/27/2015 0739   PROTEINUR 100 (A) 05/27/2015 0739   NITRITE NEGATIVE 05/27/2015 0739   LEUKOCYTESUR NEGATIVE 05/27/2015 0739   LEUKOCYTESUR Negative 11/05/2013 0416     RADIOLOGY: Dg Chest 1 View  Result Date: 01/09/2016 CLINICAL DATA:  Epigastric pain EXAM: CHEST 1 VIEW COMPARISON:  10/31/2013 FINDINGS: The heart size and mediastinal contours are within normal limits. Both lungs are clear. The visualized skeletal structures are unremarkable. IMPRESSION: No active disease. Electronically Signed   By: Alcide CleverMark  Lukens M.D.   On: 01/09/2016 18:55   Ct Abdomen Pelvis W Contrast  Result Date: 01/09/2016 CLINICAL DATA:  Central upper abdominal pain, gradual onset worsening to a severe pain, patient states it is similar to prior pancreatitis, acute epigastric  pain radiating to back EXAM: CT ABDOMEN AND PELVIS WITH CONTRAST TECHNIQUE: Multidetector CT imaging of the abdomen and pelvis was performed using the standard protocol following bolus administration  of intravenous contrast. Sagittal and coronal MPR images reconstructed from axial data set. CONTRAST:  ISOVUE-300 IOPAMIDOL (ISOVUE-300) INJECTION 61% IV. No oral contrast administered. COMPARISON:  05/27/2015 FINDINGS: Lower chest: Lung bases clear Hepatobiliary: Fatty infiltration of liver with nodular irregular margins consistent with cirrhosis. Gallbladder surgically absent. No definite focal hepatic mass lesion. Pancreas: Ill defined with diffuse peripancreatic edema compatible with acute pancreatitis. No evidence of pancreatic necrosis or hemorrhage. No discrete loculated fluid collection/ pseudocyst. Edema extends to adjacent to the duodenum. Spleen: Normal appearance Adrenals/Urinary Tract: Adrenal glands, kidneys, ureters, and bladder normal appearance. Stomach/Bowel: Appendix not visualized. Stomach and bowel loops otherwise unremarkable. Vascular/Lymphatic: Aorta normal caliber. Portal vein, splenic vein, and superior mesenteric vein appear patent. No adenopathy. Reproductive: N/A Other: No free air, free fluid or hernia Musculoskeletal: Degenerative changes RIGHT hip joint with joint space narrowing, minimal spurring and subchondral cyst formation. Slight superolateral subluxation of RIGHT femoral head. IMPRESSION: Changes of acute pancreatitis without acute complication. Cirrhotic liver. Degenerative changes RIGHT hip joint. Electronically Signed   By: Ulyses Southward M.D.   On: 01/09/2016 19:02    EKG: Orders placed or performed during the hospital encounter of 05/27/15  . EKG 12-Lead  . EKG 12-Lead  . EKG    IMPRESSION AND PLAN: Patient is a 50 year old white male with alcohol abuse presents with abdominal pain 1. Acute pancreatitis due to alcohol abuse Keep nothing by mouth IV fluids Pain control  2. Alcohol abuse Ciwa protocol  3. leukocytosis due to number one monitor cbc  4. Hypercalcemia likely due to dehydration given IV fluids monitor calcium  5. Miscellaneous Lovenox  for DVT prophylaxis     All the records are reviewed and case discussed with ED provider. Management plans discussed with the patient, family and they are in agreement.  CODE STATUS: Code Status History    Date Active Date Inactive Code Status Order ID Comments User Context   05/27/2015 11:15 AM 05/31/2015  3:45 PM Full Code [id number]  Shaune Pollack, MD Inpatient       TOTAL TIME TAKING CARE OF THIS PATIENT: 55 minutes.    Auburn Bilberry M.D on 01/09/2016 at 7:56 PM  Between 7am to 6pm - Pager - [phone]  After 6pm go to [email or web address] - password [email or web address] [facility]  [facility]  Office  [phone]  CC: Primary care physician; Megan Mans, MD

## 2016-01-10 LAB — CBC
HCT: 41.9 % (ref 40.0–52.0)
Hemoglobin: 14.4 g/dL (ref 13.0–18.0)
MCH: 34.5 pg — ABNORMAL HIGH (ref 26.0–34.0)
MCHC: 34.4 g/dL (ref 32.0–36.0)
MCV: 100.2 fL — ABNORMAL HIGH (ref 80.0–100.0)
PLATELETS: 173 10*3/uL (ref 150–440)
RBC: 4.18 MIL/uL — AB (ref 4.40–5.90)
RDW: 14.6 % — AB (ref 11.5–14.5)
WBC: 15.6 10*3/uL — AB (ref 3.8–10.6)

## 2016-01-10 LAB — LIPID PANEL
CHOL/HDL RATIO: 3.4 ratio
Cholesterol: 186 mg/dL (ref 0–200)
HDL: 54 mg/dL (ref >40)
LDL CALC: 111 mg/dL — AB (ref 0–99)
Triglycerides: 104 mg/dL (ref <150)
VLDL: 21 mg/dL (ref 0–40)

## 2016-01-10 LAB — BASIC METABOLIC PANEL
ANION GAP: 9 (ref 5–15)
BUN: 15 mg/dL (ref 6–20)
CO2: 24 mmol/L (ref 22–32)
Calcium: 9 mg/dL (ref 8.9–10.3)
Chloride: 103 mmol/L (ref 101–111)
Creatinine, Ser: 0.77 mg/dL (ref 0.61–1.24)
GFR calc Af Amer: >60 mL/min (ref >60)
GLUCOSE: 158 mg/dL — AB (ref 65–99)
POTASSIUM: 3.9 mmol/L (ref 3.5–5.1)
SODIUM: 136 mmol/L (ref 135–145)

## 2016-01-10 LAB — LIPASE, BLOOD: LIPASE: 1151 U/L — AB (ref 11–51)

## 2016-01-10 MED ORDER — NICOTINE 21 MG/24HR TD PT24
21.0000 mg | MEDICATED_PATCH | Freq: Every day | TRANSDERMAL | Status: DC
  Administered 2016-01-10 – 2016-01-11 (×2): 21 mg via TRANSDERMAL
  Filled 2016-01-10 (×2): qty 1

## 2016-01-10 MED ORDER — OXYCODONE HCL 5 MG PO TABS
10.0000 mg | ORAL_TABLET | ORAL | Status: DC | PRN
  Administered 2016-01-10 – 2016-01-11 (×2): 10 mg via ORAL
  Filled 2016-01-10 (×2): qty 2

## 2016-01-10 MED ORDER — HYDROMORPHONE HCL 1 MG/ML IJ SOLN
1.0000 mg | INTRAMUSCULAR | Status: DC | PRN
  Administered 2016-01-10 – 2016-01-11 (×3): 1 mg via INTRAVENOUS
  Filled 2016-01-10 (×3): qty 1

## 2016-01-10 NOTE — Progress Notes (Addendum)
[facility]   PATIENT NAME: Timothy Johns    MR#:  [id number]  DATE OF BIRTH:  11/20/1965  SUBJECTIVE:   Came in with increasing ABd pain after drinking alcohol. Has been depressed lately but denies suicidal ideation REVIEW OF SYSTEMS:   Review of Systems  Constitutional: Negative for chills, fever and weight loss.  HENT: Negative for ear discharge, ear pain and nosebleeds.   Eyes: Negative for blurred vision, pain and discharge.  Respiratory: Negative for sputum production, shortness of breath, wheezing and stridor.   Cardiovascular: Negative for chest pain, palpitations, orthopnea and PND.  Gastrointestinal: Positive for abdominal pain. Negative for diarrhea, nausea and vomiting.  Genitourinary: Negative for frequency and urgency.  Musculoskeletal: Negative for back pain and joint pain.  Neurological: Positive for weakness. Negative for sensory change, speech change and focal weakness.  Psychiatric/Behavioral: Negative for depression and hallucinations. The patient is not nervous/anxious.    Tolerating Diet:npo Tolerating PT: ambulatory  DRUG ALLERGIES:   Allergies  Allergen Reactions  . Tylenol [Acetaminophen] Other (See Comments)    Pt. states PCP told him not to take because of his liver.    VITALS:  Blood pressure (!) 155/74, pulse (!) 59, temperature 98.2 F (36.8 C), temperature source Oral, resp. rate 18, height 5\' 10"  (1.778 m), weight 106.5 kg (234 lb 14.4 oz), SpO2 94 %.  PHYSICAL EXAMINATION:   Physical Exam  GENERAL:  50 y.o.-year-old patient lying in the bed with no acute distress.  EYES: Pupils equal, round, reactive to light and accommodation. No scleral icterus. Extraocular muscles intact.  HEENT: Head atraumatic, normocephalic. Oropharynx and nasopharynx clear.  NECK:  Supple, no jugular venous distention. No thyroid enlargement, no tenderness.  LUNGS: Normal breath sounds bilaterally, no wheezing,  rales, rhonchi. No use of accessory muscles of respiration.  CARDIOVASCULAR: S1, S2 normal. No murmurs, rubs, or gallops.  ABDOMEN: Soft, nontender, nondistended. Bowel sounds present. No organomegaly or mass.  EXTREMITIES: No cyanosis, clubbing or edema b/l.    NEUROLOGIC: Cranial nerves II through XII are intact. No focal Motor or sensory deficits b/l.   PSYCHIATRIC:  patient is alert and oriented x 3.  SKIN: No obvious rash, lesion, or ulcer.   LABORATORY PANEL:  CBC  Recent Labs Lab 01/10/16 0403  WBC 15.6*  HGB 14.4  HCT 41.9  PLT 173    Chemistries   Recent Labs Lab 01/09/16 1752 01/10/16 0403  NA 135 136  K 3.7 3.9  CL 102 103  CO2 18* 24  GLUCOSE 148* 158*  BUN 14 15  CREATININE 0.98 0.77  CALCIUM 10.7* 9.0  AST 57*  --   ALT 66*  --   ALKPHOS 103  --   BILITOT 1.1  --    Cardiac Enzymes No results for input(s): TROPONINI in the last 168 hours. RADIOLOGY:  Dg Chest 1 View  Result Date: 01/09/2016 CLINICAL DATA:  Epigastric pain EXAM: CHEST 1 VIEW COMPARISON:  10/31/2013 FINDINGS: The heart size and mediastinal contours are within normal limits. Both lungs are clear. The visualized skeletal structures are unremarkable. IMPRESSION: No active disease. Electronically Signed   By: Alcide CleverMark  Lukens M.D.   On: 01/09/2016 18:55   Ct Abdomen Pelvis W Contrast  Result Date: 01/09/2016 CLINICAL DATA:  Central upper abdominal pain, gradual onset worsening to a severe pain, patient states it is similar to prior pancreatitis, acute epigastric pain radiating to back EXAM: CT ABDOMEN AND PELVIS WITH CONTRAST TECHNIQUE: Multidetector CT  imaging of the abdomen and pelvis was performed using the standard protocol following bolus administration of intravenous contrast. Sagittal and coronal MPR images reconstructed from axial data set. CONTRAST:  100mL ISOVUE-300 IOPAMIDOL (ISOVUE-300) INJECTION 61% IV. No oral contrast administered. COMPARISON:  05/27/2015 FINDINGS: Lower chest:  Lung bases clear Hepatobiliary: Fatty infiltration of liver with nodular irregular margins consistent with cirrhosis. Gallbladder surgically absent. No definite focal hepatic mass lesion. Pancreas: Ill defined with diffuse peripancreatic edema compatible with acute pancreatitis. No evidence of pancreatic necrosis or hemorrhage. No discrete loculated fluid collection/ pseudocyst. Edema extends to adjacent to the duodenum. Spleen: Normal appearance Adrenals/Urinary Tract: Adrenal glands, kidneys, ureters, and bladder normal appearance. Stomach/Bowel: Appendix not visualized. Stomach and bowel loops otherwise unremarkable. Vascular/Lymphatic: Aorta normal caliber. Portal vein, splenic vein, and superior mesenteric vein appear patent. No adenopathy. Reproductive: N/A Other: No free air, free fluid or hernia Musculoskeletal: Degenerative changes RIGHT hip joint with joint space narrowing, minimal spurring and subchondral cyst formation. Slight superolateral subluxation of RIGHT femoral head. IMPRESSION: Changes of acute pancreatitis without acute complication. Cirrhotic liver. Degenerative changes RIGHT hip joint. Electronically Signed   By: Ulyses SouthwardMark  Boles M.D.   On: 01/09/2016 19:02   ASSESSMENT AND PLAN:   Timothy Johns  is a 50 y.o. 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 Patient reports the pain started this morning. Severe and sharp and epigastric in nature. He has not had any nausea or vomiting or diarrhea.  1. Acute pancreatitis due to alcohol abuse -Patient said he started drinking back since he is feeling depressed. Denies any suicidal ideation. Keep nothing by mouth IV fluids Pain control Lipase 1300---1100  2. Alcohol abuse Ciwa protocol Patient has been having depression. Offered him to see psychiatry. Denies it he will get the referral through his primary care physician Dr. Sullivan LoneGilbert. Patient denies any suicidal  ideation.  3. leukocytosis due acute pancreatitis   4. Hypercalcemia likely due to dehydration given IV fluids monitor calcium Improved 5. Miscellaneous Lovenox for DVT prophylaxis   Case discussed with Care Management/Social Worker. Management plans discussed with the patient, family and they are in agreement.  CODE STATUS: Full DVT Prophylaxis: Lovenox TOTAL TIME TAKING CARE OF THIS PATIENT: 30 minutes.  >50% time spent on counselling and coordination of care  POSSIBLE D/C IN one to 2 DAYS, DEPENDING ON CLINICAL CONDITION.  Note: This dictation was prepared with Dragon dictation along with smaller phrase technology. Any transcriptional errors that result from this process are unintentional.  Landry Lookingbill M.D on 01/10/2016 at 12:53 PM  Between 7am to 6pm - Pager - [phone]  After 6pm go to [email or web address] - password [email or web address] [email or web address]  [facility]  Office  [phone]  CC: Primary care physician; Megan Mansichard Gilbert Jr, MD

## 2016-01-11 LAB — LIPASE, BLOOD: Lipase: 121 U/L — ABNORMAL HIGH (ref 11–51)

## 2016-01-11 MED ORDER — OXYCODONE HCL 10 MG PO TABS: 10.0000 mg | ORAL_TABLET | ORAL | 0 refills | Status: DC | PRN

## 2016-01-11 NOTE — Progress Notes (Signed)
Pt tolerating full liquids. No nausea or vomiting. Requested and provided resources for depression and treatment. Pt requests discharge when possible.

## 2016-01-11 NOTE — Progress Notes (Signed)
Pt discharged to home with family. Rx's provided with d/c instructions and reviewed. Pt will f/u with PCP Dr. Sullivan LoneGilbert.

## 2016-01-11 NOTE — Discharge Summary (Signed)
[facility] [place] [facility]   PATIENT NAME: Timothy Johns    MR#:  [id number]  DATE OF BIRTH:  Dec 16, 1965  DATE OF ADMISSION:  01/09/2016 ADMITTING PHYSICIAN: Auburn BilberryShreyang Lilyahna Sirmon, MD  DATE OF DISCHARGE: 01/11/16  PRIMARY CARE PHYSICIAN: Megan Mansichard Gilbert Jr, MD    ADMISSION DIAGNOSIS:  Epigastric pain [R10.13] Metabolic acidosis [E87.2] Alcohol-induced acute pancreatitis without infection or necrosis [K85.20]  DISCHARGE DIAGNOSIS:  Acute Alcohol induced Pancreatitis  SECONDARY DIAGNOSIS:   Past Medical History:  Diagnosis Date  . Cirrhosis (HCC)   . Hypertension     HOSPITAL COURSE:   [name] a 50 y.o.malewith a known history of Colic liver cirrhosis alcohol abuse in the past and hypertension presenting with abdominal pain. Patient reports the pain started this morning. Severe and sharp and epigastric in nature. He has not had any nausea or vomiting or diarrhea.  1. Acute pancreatitis due to alcohol abuse -Patient said he started drinking back since he is feeling depressed. Denies any suicidal ideation. Keep nothing by mouth IV fluids Pain control Lipase 1300---1100--121 -feels a lot better. Start CLD  2. Alcohol abuse Ciwa protocol-'0" today Patient has been having depression. Offered him to see psychiatry. Denies it,he will get the referral through his primary care physician Dr. Sullivan LoneGilbert. Patient denies any suicidal ideation.  3. leukocytosis due acute pancreatitis   4. Hypercalcemia likely due to dehydration given IV fluids monitor calcium Improved  5. Miscellaneous Lovenox for DVT prophylaxis  CLD today and advance to FLD at lunch D/c later to home  CONSULTS OBTAINED:    DRUG ALLERGIES:   Allergies  Allergen Reactions  . Tylenol [Acetaminophen] Other (See Comments)    Pt. states PCP told him not to take because of his liver.    DISCHARGE MEDICATIONS:   Current Discharge Medication List    START  taking these medications   Details  oxyCODONE 10 MG TABS Take 1 tablet (10 mg total) by mouth every 4 (four) hours as needed for moderate pain. Qty: 20 tablet, Refills: 0      CONTINUE these medications which have NOT CHANGED   Details  aspirin EC 325 MG tablet Take 325 mg by mouth daily.    Multiple Vitamin (MULTIVITAMIN) tablet Take 1 tablet by mouth daily.    nicotine (NICODERM CQ - DOSED IN MG/24 HR) 7 mg/24hr patch Place 1 patch (7 mg total) onto the skin daily. Qty: 28 patch, Refills: 0    thiamine 100 MG tablet Take 100 mg by mouth daily.    Vitamin D, Cholecalciferol, 400 units TABS Take 1 tablet by mouth daily.     spironolactone (ALDACTONE) 50 MG tablet Take 1 tablet (50 mg total) by mouth daily. Reported on 06/19/2015 Qty: 30 tablet, Refills: 1        If you experience worsening of your admission symptoms, develop shortness of breath, life threatening emergency, suicidal or homicidal thoughts you must seek medical attention immediately by calling 911 or calling your MD immediately  if symptoms less severe.  You Must read complete instructions/literature along with all the possible adverse reactions/side effects for all the Medicines you take and that have been prescribed to you. Take any new Medicines after you have completely understood and accept all the possible adverse reactions/side effects.   Please note  You were cared for by a hospitalist during your hospital stay. If you have any questions about your discharge medications or the care you received while you were in the hospital after  you are discharged, you can call the unit and asked to speak with the hospitalist on call if the hospitalist that took care of you is not available. Once you are discharged, your primary care physician will handle any further medical issues. Please note that NO REFILLS for any discharge medications will be authorized once you are discharged, as it is imperative that you return to your  primary care physician (or establish a relationship with a primary care physician if you do not have one) for your aftercare needs so that they can reassess your need for medications and monitor your lab values. Today   SUBJECTIVE   I feel better today  VITAL SIGNS:  Blood pressure 136/76, pulse 84, temperature 98.2 F (36.8 C), temperature source Oral, resp. rate 18, height 5\' 10"  (1.778 m), weight 106.5 kg (234 lb 14.4 oz), SpO2 95 %.  I/O:   Intake/Output Summary (Last 24 hours) at 01/11/16 0730 Last data filed at 01/11/16 0400  Gross per 24 hour  Intake          3527.08 ml  Output              550 ml  Net          2977.08 ml    PHYSICAL EXAMINATION:  GENERAL:  50 y.o.-year-old patient lying in the bed with no acute distress. obese EYES: Pupils equal, round, reactive to light and accommodation. No scleral icterus. Extraocular muscles intact.  HEENT: Head atraumatic, normocephalic. Oropharynx and nasopharynx clear.  NECK:  Supple, no jugular venous distention. No thyroid enlargement, no tenderness.  LUNGS: Normal breath sounds bilaterally, no wheezing, rales,rhonchi or crepitation. No use of accessory muscles of respiration.  CARDIOVASCULAR: S1, S2 normal. No murmurs, rubs, or gallops.  ABDOMEN: Soft, non-tender, non-distended. Bowel sounds present. No organomegaly or mass.  EXTREMITIES: No pedal edema, cyanosis, or clubbing.  NEUROLOGIC: Cranial nerves II through XII are intact. Muscle strength 5/5 in all extremities. Sensation intact. Gait not checked.  PSYCHIATRIC: The patient is alert and oriented x 3.  SKIN: No obvious rash, lesion, or ulcer.   DATA REVIEW:   CBC   Recent Labs Lab 01/10/16 0403  WBC 15.6*  HGB 14.4  HCT 41.9  PLT 173    Chemistries   Recent Labs Lab 01/09/16 1752 01/10/16 0403  NA 135 136  K 3.7 3.9  CL 102 103  CO2 18* 24  GLUCOSE 148* 158*  BUN 14 15  CREATININE 0.98 0.77  CALCIUM 10.7* 9.0  AST 57*  --   ALT 66*  --   ALKPHOS  103  --   BILITOT 1.1  --     Microbiology Results   No results found for this or any previous visit (from the past 240 hour(s)).  RADIOLOGY:  Dg Chest 1 View  Result Date: 01/09/2016 CLINICAL DATA:  Epigastric pain EXAM: CHEST 1 VIEW COMPARISON:  10/31/2013 FINDINGS: The heart size and mediastinal contours are within normal limits. Both lungs are clear. The visualized skeletal structures are unremarkable. IMPRESSION: No active disease. Electronically Signed   By: Alcide CleverMark  Lukens M.D.   On: 01/09/2016 18:55   Ct Abdomen Pelvis W Contrast  Result Date: 01/09/2016 CLINICAL DATA:  Central upper abdominal pain, gradual onset worsening to a severe pain, patient states it is similar to prior pancreatitis, acute epigastric pain radiating to back EXAM: CT ABDOMEN AND PELVIS WITH CONTRAST TECHNIQUE: Multidetector CT imaging of the abdomen and pelvis was performed using the standard protocol following bolus  administration of intravenous contrast. Sagittal and coronal MPR images reconstructed from axial data set. CONTRAST:  ISOVUE-300 IOPAMIDOL (ISOVUE-300) INJECTION 61% IV. No oral contrast administered. COMPARISON:  05/27/2015 FINDINGS: Lower chest: Lung bases clear Hepatobiliary: Fatty infiltration of liver with nodular irregular margins consistent with cirrhosis. Gallbladder surgically absent. No definite focal hepatic mass lesion. Pancreas: Ill defined with diffuse peripancreatic edema compatible with acute pancreatitis. No evidence of pancreatic necrosis or hemorrhage. No discrete loculated fluid collection/ pseudocyst. Edema extends to adjacent to the duodenum. Spleen: Normal appearance Adrenals/Urinary Tract: Adrenal glands, kidneys, ureters, and bladder normal appearance. Stomach/Bowel: Appendix not visualized. Stomach and bowel loops otherwise unremarkable. Vascular/Lymphatic: Aorta normal caliber. Portal vein, splenic vein, and superior mesenteric vein appear patent. No adenopathy. Reproductive:  N/A Other: No free air, free fluid or hernia Musculoskeletal: Degenerative changes RIGHT hip joint with joint space narrowing, minimal spurring and subchondral cyst formation. Slight superolateral subluxation of RIGHT femoral head. IMPRESSION: Changes of acute pancreatitis without acute complication. Cirrhotic liver. Degenerative changes RIGHT hip joint. Electronically Signed   By: Ulyses Southward M.D.   On: 01/09/2016 19:02     Management plans discussed with the patient, family and they are in agreement.  CODE STATUS:     Code Status Orders        Start     Ordered   01/09/16 2045  Full code  Continuous     01/09/16 2044    Code Status History    Date Active Date Inactive Code Status Order ID Comments User Context   05/27/2015 11:15 AM 05/31/2015  3:45 PM Full Code [id number]  Shaune Pollack, MD Inpatient      TOTAL TIME TAKING CARE OF THIS PATIENT: 40 minutes.    Rawan Riendeau M.D on 01/11/2016 at 7:30 AM  Between 7am to 6pm - Pager - [phone] After 6pm go to [email or web address] - password [email or web address] [facility]  [facility]  Office  [phone]  CC: Primary care physician; Megan Mans, MD

## 2016-01-15 ENCOUNTER — Telehealth: Payer: Self-pay

## 2016-01-15 NOTE — Telephone Encounter (Signed)
Patient called and wanted to ask you about getting in with psychiatrist or psychologist and would like to get names if you could provide that. He would like to speak to you personally if possible. CB [phone]

## 2016-01-27 NOTE — Telephone Encounter (Signed)
Dr Sullivan Lonegilbert have you spoke to patient about this? He called originally 01/15/16-aa

## 2016-01-28 NOTE — Telephone Encounter (Signed)
Yes. And I have the have the information for him. I will get it to you today. Thank you

## 2016-01-28 NOTE — Telephone Encounter (Signed)
Ok?-aa 

## 2016-02-20 ENCOUNTER — Ambulatory Visit (INDEPENDENT_AMBULATORY_CARE_PROVIDER_SITE_OTHER): Payer: Self-pay | Admitting: Physician Assistant

## 2016-02-20 ENCOUNTER — Ambulatory Visit: Payer: Self-pay | Admitting: Family Medicine

## 2016-02-20 ENCOUNTER — Encounter: Payer: Self-pay | Admitting: Physician Assistant

## 2016-02-20 VITALS — BP 148/70 | HR 80 | Temp 98.4°F | Resp 16 | Wt 240.0 lb

## 2016-02-20 DIAGNOSIS — K047 Periapical abscess without sinus: Secondary | ICD-10-CM

## 2016-02-20 DIAGNOSIS — R220 Localized swelling, mass and lump, head: Secondary | ICD-10-CM

## 2016-02-20 MED ORDER — PREDNISONE 20 MG PO TABS: 20.0000 mg | ORAL_TABLET | Freq: Every day | ORAL | 0 refills | Status: AC

## 2016-02-20 MED ORDER — AMOXICILLIN-POT CLAVULANATE 875-125 MG PO TABS: 1.0000 | ORAL_TABLET | Freq: Two times a day (BID) | ORAL | 0 refills | Status: AC

## 2016-02-20 NOTE — Patient Instructions (Signed)
Dental Abscess °Introduction °A dental abscess is pus in or around a tooth. °Follow these instructions at home: °· Take medicines only as told by your dentist. °· If you were prescribed antibiotic medicine, finish all of it even if you start to feel better. °· Rinse your mouth (gargle) often with salt water. °· Do not drive or use heavy machinery, like a lawn mower, while taking pain medicine. °· Do not apply heat to the outside of your mouth. °· Keep all follow-up visits as told by your dentist. This is important. °Contact a doctor if: °· Your pain is worse, and medicine does not help. °Get help right away if: °· You have a fever or chills. °· Your symptoms suddenly get worse. °· You have a very bad headache. °· You have problems breathing or swallowing. °· You have trouble opening your mouth. °· You have puffiness (swelling) in your neck or around your eye. °This information is not intended to replace advice given to you by your health care provider. Make sure you discuss any questions you have with your health care provider. °Document Released: 05/21/2014 Document Revised: 06/12/2015 Document Reviewed: 01/01/2014 °© 2017 Elsevier ° °

## 2016-02-20 NOTE — Progress Notes (Signed)
Patient: Timothy Johns Male    DOB: June 26, 1965   51 y.o.   MRN: [id number] Visit Date: 02/20/2016  Today's Provider: Trey Sailors, PA-C   Chief Complaint  Patient presents with  . Oral Pain   Subjective:    HPI  Pt is a 51 y/o man with history of alcoholic cirrhosis and smoking who comes in today complaining of right sided cheek/mouth pain swelling.  He says it started earlier in the week and has worsened.  The pain has now spread to his gums but denies teeth or "root" pain.  He also denies drainage, fevers.  He denies swelling or pain around his eye, changes in his vision. He reports a pressure when he eats.He does report that he "Bit the inside of his cheek twice on Sunday".      Allergies  Allergen Reactions  . Tylenol [Acetaminophen] Other (See Comments)    Pt. states PCP told him not to take because of his liver.     Current Outpatient Prescriptions:  Marland Kitchen  Multiple Vitamin (MULTIVITAMIN) tablet, Take 1 tablet by mouth daily., Disp: , Rfl:  .  spironolactone (ALDACTONE) 50 MG tablet, Take 1 tablet (50 mg total) by mouth daily. Reported on 06/19/2015, Disp: 30 tablet, Rfl: 1 .  thiamine 100 MG tablet, Take 100 mg by mouth daily., Disp: , Rfl:  .  Vitamin D, Cholecalciferol, 400 units TABS, Take 1 tablet by mouth daily. , Disp: , Rfl:  .  aspirin EC 325 MG tablet, Take 325 mg by mouth daily., Disp: , Rfl:  .  nicotine (NICODERM CQ - DOSED IN MG/24 HR) 7 mg/24hr patch, Place 1 patch (7 mg total) onto the skin daily. (Patient not taking: Reported on 02/20/2016), Disp: 28 patch, Rfl: 0  Review of Systems  Constitutional: Negative for activity change, appetite change, chills, diaphoresis, fatigue, fever and unexpected weight change.  HENT: Positive for dental problem. Negative for congestion, nosebleeds, postnasal drip, rhinorrhea, sinus pain and sinus pressure.   Cardiovascular: Negative.   Musculoskeletal: Positive for arthralgias. Negative for back pain, gait problem,  joint swelling, myalgias, neck pain and neck stiffness.  Neurological: Negative for dizziness, light-headedness and headaches.    Social History  Substance Use Topics  . Smoking status: Current Some Day Smoker    Packs/day: 0.50    Years: 20.00    Types: Cigarettes  . Smokeless tobacco: Never Used     Comment: 1-3 packs a week, he is wearing a patch  . Alcohol use No     Comment: Hx of alcohol abuse, quit 8 months ago   Objective:   BP (!) 148/70 (BP Location: Left Arm, Patient Position: Sitting, Cuff Size: Large)   Pulse 80   Temp 98.4 F (36.9 C) (Oral)   Resp 16   Wt 240 lb (108.9 kg)   BMI 34.44 kg/m   Physical Exam  Constitutional: He appears well-developed and well-nourished. No distress.  HENT:  Mouth/Throat: Uvula is midline, oropharynx is clear and moist and mucous membranes are normal. No oral lesions. Dental abscesses and dental caries present. No uvula swelling or lacerations.    Eyes: Conjunctivae are normal.  Skin: He is not diaphoretic.        Assessment & Plan:     1. Dental abscess  Will treat as below for infection. Advised patient to follow up with a dentist. Advised on good oral hygiene and smoking cessation. Counseled if abscess is not resolved, should come  back in so we can re-evaluate.  - amoxicillin-clavulanate (AUGMENTIN) 875-125 MG tablet; Take 1 tablet by mouth 2 (two) times daily.  Dispense: 20 tablet; Refill: 0  2. Cheek swelling  - predniSONE (DELTASONE) 20 MG tablet; Take 1 tablet (20 mg total) by mouth daily with breakfast.  Dispense: 3 tablet; Refill: 0  Return if symptoms worsen or fail to improve.   Patient Instructions  Dental Abscess Introduction A dental abscess is pus in or around a tooth. Follow these instructions at home:  Take medicines only as told by your dentist.  If you were prescribed antibiotic medicine, finish all of it even if you start to feel better.  Rinse your mouth (gargle) often with salt water.  Do  not drive or use heavy machinery, like a lawn mower, while taking pain medicine.  Do not apply heat to the outside of your mouth.  Keep all follow-up visits as told by your dentist. This is important. Contact a doctor if:  Your pain is worse, and medicine does not help. Get help right away if:  You have a fever or chills.  Your symptoms suddenly get worse.  You have a very bad headache.  You have problems breathing or swallowing.  You have trouble opening your mouth.  You have puffiness (swelling) in your neck or around your eye. This information is not intended to replace advice given to you by your health care provider. Make sure you discuss any questions you have with your health care provider. Document Released: 05/21/2014 Document Revised: 06/12/2015 Document Reviewed: 01/01/2014  2017 Elsevier   The entirety of the information documented in the History of Present Illness, Review of Systems and Physical Exam were personally obtained by me. Portions of this information were initially documented by Kavin LeechLaura Sanam Marmo, CMA and reviewed by me for thoroughness and accuracy.        Trey SailorsAdriana M Pollak, PA-C  [facility]

## 2018-08-01 ENCOUNTER — Other Ambulatory Visit: Payer: Self-pay | Admitting: Family Medicine

## 2018-08-01 NOTE — Telephone Encounter (Signed)
Pt contacted office for refill request on the following medications:  1. spironolactone (ALDACTONE) 50 MG tablet  2. furosemide (LASIX) 20 MG tablet  LOV: 02/20/2016  Pt stated that if he needs an OV to get the medications sent to pharmacy he doesn't want to come in the office because of Covid. Pt stated that he is self pay and is trying to keep cost low. Pt stated that if he has to do an OV he would rather do a telephone encounter and not an e-visit. Please advise. Thanks TNP

## 2018-08-01 NOTE — Telephone Encounter (Signed)
Please advise if patient needs office visit prior to refill. KW

## 2018-08-02 NOTE — Telephone Encounter (Signed)
I will need to see Timothy Johns as it has been 3 years. I need to examine him and get blood work.

## 2018-08-02 NOTE — Telephone Encounter (Signed)
Patient was advised appt scheduled for 08/07/18. KW

## 2018-08-07 ENCOUNTER — Ambulatory Visit (INDEPENDENT_AMBULATORY_CARE_PROVIDER_SITE_OTHER): Payer: Self-pay | Admitting: Family Medicine

## 2018-08-07 ENCOUNTER — Other Ambulatory Visit: Payer: Self-pay

## 2018-08-07 ENCOUNTER — Encounter: Payer: Self-pay | Admitting: Family Medicine

## 2018-08-07 VITALS — BP 130/68 | HR 88 | Temp 98.3°F | Resp 24 | Ht 70.0 in | Wt 270.0 lb

## 2018-08-07 DIAGNOSIS — Z6839 Body mass index (BMI) 39.0-39.9, adult: Secondary | ICD-10-CM

## 2018-08-07 DIAGNOSIS — K7031 Alcoholic cirrhosis of liver with ascites: Secondary | ICD-10-CM

## 2018-08-07 DIAGNOSIS — E669 Obesity, unspecified: Secondary | ICD-10-CM

## 2018-08-07 MED ORDER — SPIRONOLACTONE 50 MG PO TABS: 50.0000 mg | ORAL_TABLET | Freq: Two times a day (BID) | ORAL | Status: DC

## 2018-08-07 MED ORDER — SPIRONOLACTONE 50 MG PO TABS: 50.0000 mg | ORAL_TABLET | Freq: Two times a day (BID) | ORAL | 5 refills | Status: DC

## 2018-08-07 MED ORDER — FUROSEMIDE 20 MG PO TABS: 20.0000 mg | ORAL_TABLET | Freq: Every day | ORAL | 3 refills | Status: DC

## 2018-08-07 NOTE — Progress Notes (Addendum)
Patient: Timothy Johns Male    DOB: 01-24-1965   53 y.o.   MRN: [id number] Visit Date: 08/07/2018  Today's Provider: Wilhemena Durie, MD   Chief Complaint  Patient presents with  . Follow-up   Subjective:   HPI Patient comes in today for a follow up. He was not been seen in the office since May 19, 2015.  He has been noncompliant with any f/u here or with GI. He is requesting to be put back on a diuretic due to excessive swelling in his ankles. He has gained 30lbs since his LOV.  He states he has been eating a lot of fast foods. He does admit that he is still having issues with alcoholism. However, he has not had a drink in the last 2 months.  His mother died late 2017/05/18 and pt drank from January through May 19, 2022. 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 Allergies  Allergen Reactions  . Tylenol [Acetaminophen] Other (See Comments)    Pt. states PCP told him not to take because of his liver.     Current Outpatient Medications:  .  aspirin EC 325 MG tablet, Take 325 mg by mouth daily., Disp: , Rfl:  .  Multiple Vitamin (MULTIVITAMIN) tablet, Take 1 tablet by mouth daily., Disp: , Rfl:  .  thiamine 100 MG tablet, Take 100 mg by mouth daily., Disp: , Rfl:  .  Vitamin D, Cholecalciferol, 400 units TABS, Take 1 tablet by mouth daily. , Disp: , Rfl:  .  nicotine (NICODERM CQ - DOSED IN MG/24 HR) 7 mg/24hr patch, Place 1 patch (7 mg total) onto the skin daily. (Patient not taking: Reported on 02/20/2016), Disp: 28 patch, Rfl: 0 .  spironolactone (ALDACTONE) 50 MG tablet, Take 1 tablet (50 mg total) by mouth daily. Reported on 06/19/2015 (Patient not taking: Reported on 08/07/2018), Disp: 30 tablet, Rfl: 1  Review of Systems  Constitutional: Positive for fatigue.  Eyes: Negative.   Respiratory: Negative.   Cardiovascular: Positive for leg swelling.  Gastrointestinal: Positive for abdominal distention.  Endocrine: Negative.    Musculoskeletal: Positive for arthralgias.       Hip pain  Allergic/Immunologic: Negative.   Neurological: Negative.   Hematological: Negative.      Social History   Tobacco Use  . Smoking status: Current Some Day Smoker    Packs/day: 0.50    Years: 20.00    Pack years: 10.00    Types: Cigarettes  . Smokeless tobacco: Never Used  . Tobacco comment: 1-3 packs a week, he is wearing a patch  Substance Use Topics  . Alcohol use: No    Alcohol/week: 0.0 standard drinks    Comment: Hx of alcohol abuse, quit 8 months ago      Objective:   BP 130/68   Pulse 88   Temp 98.3 F (36.8 C)   Resp (!) 24   Ht 5\' 10"  (1.778 m)   Wt 270 lb (122.5 kg)   SpO2 96%   BMI 38.74 kg/m  Vitals:   08/07/18 0918  BP: 130/68  Pulse: 88  Resp: (!) 24  Temp: 98.3 F (36.8 C)  SpO2: 96%  Weight: 270 lb (122.5 kg)  Height: 5\' 10"  (1.778 m)     Physical Exam Vitals signs reviewed.  Constitutional:      General: He is not in acute distress.    Appearance: He is ill-appearing. He is not diaphoretic.  HENT:     Head: Normocephalic and atraumatic.     Right Ear: External ear normal.     Left Ear: External ear normal.     Nose: Nose normal.  Eyes:     General: No scleral icterus.    Conjunctiva/sclera: Conjunctivae normal.  Cardiovascular:     Heart sounds: Normal heart sounds.  Pulmonary:     Effort: Pulmonary effort is normal.     Breath sounds: Normal breath sounds.  Abdominal:     General: There is distension.     Palpations: Abdomen is soft.     Tenderness: There is no abdominal tenderness.     Comments: Massive abdominal distension with ascites.  Musculoskeletal:     Right lower leg: No edema.     Left lower leg: No edema.     Comments: 3-4+ edema to thighs  Neurological:     Mental Status: He is alert and oriented to person, place, and time. Mental status is at baseline.  Psychiatric:        Behavior: Behavior normal.        Thought Content: Thought content normal.         Judgment: Judgment normal.      No results found for any visits on 08/07/18.     Assessment & Plan    1. Alcoholic cirrhosis of liver with ascites (HCC) I will try Aldactone 50mg  BID and lasix 20mg  q am. Pt will abstain from alcohol and try low salt diet.  I think his liver disease has progressed and he is decompensating. I think he will need paracentesis and referral back to GI, Will RTC in 2 weeks to check weight and response to restarting meds. Will try to push aldactone . I am pessimistic about long term prognosis in this young man.Pt says he will follow through with plan - CBC with Differential/Platelet - Comprehensive metabolic panel - spironolactone (ALDACTONE) 50 MG tablet; Take 1 tablet (50 mg total) by mouth 2 (two) times daily.  Dispense: 60 tablet; Refill: +5 - spironolactone (ALDACTONE) 50 MG tablet; Take 1 tablet (50 mg total) by mouth 2 (two) times daily. Reported on 06/19/2015  Dispense: 60 tablet; Refill: 5 - furosemide (LASIX) 20 MG tablet; Take 1 tablet (20 mg total) by mouth daily.  Dispense: 30 tablet; Refill: 3  2. Class 2 obesity with body mass index (BMI) of 39.0 to 39.9 in adult, unspecified obesity type, unspecified whether serious comorbidity present Actually almost all weight is fluid/ascites. 3.hip pain  Pt walking with walker    Megan Mansichard Gilbert Jr, MD  [facility]

## 2018-08-08 LAB — COMPREHENSIVE METABOLIC PANEL
ALT: 14 IU/L (ref 0–44)
AST: 23 IU/L (ref 0–40)
Albumin/Globulin Ratio: 1 — ABNORMAL LOW (ref 1.2–2.2)
Albumin: 3.2 g/dL — ABNORMAL LOW (ref 3.8–4.9)
Alkaline Phosphatase: 114 IU/L (ref 39–117)
BUN/Creatinine Ratio: 22 — ABNORMAL HIGH (ref 9–20)
BUN: 18 mg/dL (ref 6–24)
Bilirubin Total: 1.5 mg/dL — ABNORMAL HIGH (ref 0.0–1.2)
CO2: 18 mmol/L — ABNORMAL LOW (ref 20–29)
Calcium: 9 mg/dL (ref 8.7–10.2)
Chloride: 106 mmol/L (ref 96–106)
Creatinine, Ser: 0.82 mg/dL (ref 0.76–1.27)
GFR calc Af Amer: 118 mL/min/{1.73_m2} (ref >59)
GFR calc non Af Amer: 102 mL/min/{1.73_m2} (ref >59)
Globulin, Total: 3.3 g/dL (ref 1.5–4.5)
Glucose: 100 mg/dL — ABNORMAL HIGH (ref 65–99)
Potassium: 4.4 mmol/L (ref 3.5–5.2)
Sodium: 142 mmol/L (ref 134–144)
Total Protein: 6.5 g/dL (ref 6.0–8.5)

## 2018-08-08 LAB — CBC WITH DIFFERENTIAL/PLATELET
Basophils Absolute: 0.1 10*3/uL (ref 0.0–0.2)
Basos: 1 %
EOS (ABSOLUTE): 0.1 10*3/uL (ref 0.0–0.4)
Eos: 1 %
Hematocrit: 33.3 % — ABNORMAL LOW (ref 37.5–51.0)
Hemoglobin: 11.4 g/dL — ABNORMAL LOW (ref 13.0–17.7)
Immature Grans (Abs): 0 10*3/uL (ref 0.0–0.1)
Immature Granulocytes: 0 %
Lymphocytes Absolute: 1.6 10*3/uL (ref 0.7–3.1)
Lymphs: 19 %
MCH: 32.7 pg (ref 26.6–33.0)
MCHC: 34.2 g/dL (ref 31.5–35.7)
MCV: 95 fL (ref 79–97)
Monocytes Absolute: 1.2 10*3/uL — ABNORMAL HIGH (ref 0.1–0.9)
Monocytes: 14 %
Neutrophils Absolute: 5.5 10*3/uL (ref 1.4–7.0)
Neutrophils: 65 %
Platelets: 191 10*3/uL (ref 150–450)
RBC: 3.49 x10E6/uL — ABNORMAL LOW (ref 4.14–5.80)
RDW: 12.7 % (ref 11.6–15.4)
WBC: 8.4 10*3/uL (ref 3.4–10.8)

## 2018-08-09 ENCOUNTER — Telehealth: Payer: Self-pay

## 2018-08-09 NOTE — Telephone Encounter (Signed)
-----   Message from Jerrol Banana., MD sent at 08/09/2018  8:54 AM EDT ----- Stable labs but that does not mean everything is ok. Keep f/u appt.

## 2018-08-09 NOTE — Telephone Encounter (Signed)
Left message to call back  

## 2018-08-09 NOTE — Telephone Encounter (Signed)
Pt advised.   Thanks,   -Jaydin Jalomo  

## 2018-09-18 ENCOUNTER — Ambulatory Visit: Payer: Self-pay | Admitting: Family Medicine

## 2018-09-21 ENCOUNTER — Telehealth: Payer: Self-pay | Admitting: Family Medicine

## 2018-09-21 DIAGNOSIS — K7031 Alcoholic cirrhosis of liver with ascites: Secondary | ICD-10-CM

## 2018-09-21 NOTE — Telephone Encounter (Signed)
Patient notified of referral placed to GI

## 2018-09-21 NOTE — Telephone Encounter (Signed)
Refer to GI--was seen here a few years ago--for alcoholic cirrhosis with significant ascites

## 2018-09-21 NOTE — Telephone Encounter (Signed)
Please advise 

## 2018-09-21 NOTE — Telephone Encounter (Signed)
Pt needs a referral to have fluid drained off his abd.  He said Dr. Rosanna Randy is aware that he gets this done  CB#  [phone]  Con Memos

## 2018-09-26 ENCOUNTER — Other Ambulatory Visit: Payer: Self-pay | Admitting: Gastroenterology

## 2018-09-26 DIAGNOSIS — K7031 Alcoholic cirrhosis of liver with ascites: Secondary | ICD-10-CM

## 2018-09-27 ENCOUNTER — Other Ambulatory Visit: Payer: Self-pay

## 2018-09-27 ENCOUNTER — Other Ambulatory Visit: Payer: Self-pay | Admitting: Gastroenterology

## 2018-09-27 ENCOUNTER — Other Ambulatory Visit
Admission: RE | Admit: 2018-09-27 | Discharge: 2018-09-27 | Disposition: A | Discharge status: Home / Self Care | Payer: Self-pay | Source: Ambulatory Visit | Attending: Gastroenterology | Admitting: Gastroenterology

## 2018-09-27 ENCOUNTER — Ambulatory Visit
Admission: RE | Admit: 2018-09-27 | Discharge: 2018-09-27 | Disposition: A | Discharge status: Home / Self Care | Payer: Self-pay | Source: Ambulatory Visit | Attending: Gastroenterology | Admitting: Gastroenterology

## 2018-09-27 ENCOUNTER — Ambulatory Visit: Payer: Self-pay | Admitting: Family Medicine

## 2018-09-27 DIAGNOSIS — K7031 Alcoholic cirrhosis of liver with ascites: Secondary | ICD-10-CM | POA: Insufficient documentation

## 2018-09-27 LAB — ALBUMIN, PLEURAL OR PERITONEAL FLUID?: Albumin, Fluid: <1 g/dL

## 2018-09-27 LAB — BODY FLUID CELL COUNT WITH DIFFERENTIAL
Eos, Fluid: 0 %
Lymphs, Fluid: 43 %
Monocyte-Macrophage-Serous Fluid: 50 %
Neutrophil Count, Fluid: 7 %
Total Nucleated Cell Count, Fluid: 88 cu mm

## 2018-09-27 LAB — ALBUMIN: Albumin: 3.1 g/dL — ABNORMAL LOW (ref 3.5–5.0)

## 2018-09-27 LAB — ALBUMIN, PLEURAL OR PERITONEAL FLUID

## 2018-09-27 LAB — SODIUM: Sodium: 138 mmol/L (ref 135–145)

## 2018-09-27 MED ORDER — ALBUMIN HUMAN 25 % IV SOLN
25.0000 g | Freq: Once | INTRAVENOUS | Status: AC
  Administered 2018-09-27: 14:00:00 25 g via INTRAVENOUS

## 2018-09-27 MED ORDER — ALBUMIN HUMAN 25 % IV SOLN
INTRAVENOUS | Status: AC
  Administered 2018-09-27: 25 g via INTRAVENOUS
  Filled 2018-09-27: qty 100

## 2018-09-27 MED ORDER — ALBUMIN HUMAN 25 % IV SOLN
25.0000 g | Freq: Once | INTRAVENOUS | Status: AC
  Administered 2018-09-27: 15:00:00 25 g via INTRAVENOUS

## 2018-09-29 LAB — CYTOLOGY - NON PAP

## 2018-10-01 LAB — BODY FLUID CULTURE
Culture: NO GROWTH
Gram Stain: NONE SEEN

## 2018-10-04 ENCOUNTER — Other Ambulatory Visit: Payer: Self-pay | Admitting: Gastroenterology

## 2018-10-04 ENCOUNTER — Other Ambulatory Visit: Payer: Self-pay

## 2018-10-04 ENCOUNTER — Ambulatory Visit: Payer: Self-pay | Admitting: Family Medicine

## 2018-10-04 ENCOUNTER — Ambulatory Visit
Admission: RE | Admit: 2018-10-04 | Discharge: 2018-10-04 | Disposition: A | Discharge status: Home / Self Care | Payer: Self-pay | Source: Ambulatory Visit | Attending: Gastroenterology | Admitting: Gastroenterology

## 2018-10-04 DIAGNOSIS — K7031 Alcoholic cirrhosis of liver with ascites: Secondary | ICD-10-CM

## 2018-10-04 LAB — BODY FLUID CELL COUNT WITH DIFFERENTIAL
Eos, Fluid: 0 %
Lymphs, Fluid: 58 %
Monocyte-Macrophage-Serous Fluid: 26 %
Neutrophil Count, Fluid: 16 %
Total Nucleated Cell Count, Fluid: 53 cu mm

## 2018-10-04 LAB — ALBUMIN, PLEURAL OR PERITONEAL FLUID: Albumin, Fluid: <1 g/dL

## 2018-10-04 MED ORDER — ALBUMIN HUMAN 25 % IV SOLN: 50.0000 g | Freq: Once | INTRAVENOUS | Status: DC

## 2018-10-04 MED ORDER — ALBUMIN HUMAN 25 % IV SOLN
INTRAVENOUS | Status: AC
  Filled 2018-10-04: qty 100

## 2018-10-04 NOTE — Progress Notes (Signed)
Patient completed first bottle of albumin, patient refused to get the whole 2nd bottle of albumin.

## 2018-10-04 NOTE — Progress Notes (Signed)
Patient only received 12.98mL of second bottle. Patient was ready to leave. Instructed patient that if he felt weak or dizzy to come back to the ED, patient stated, "that isn't going to happen."   Iv removed, patient wheeled outside.

## 2018-10-06 LAB — CYTOLOGY - NON PAP

## 2018-10-07 LAB — BODY FLUID CULTURE: Culture: NO GROWTH

## 2018-10-11 ENCOUNTER — Other Ambulatory Visit
Admission: RE | Admit: 2018-10-11 | Discharge: 2018-10-11 | Disposition: A | Discharge status: Home / Self Care | Payer: Self-pay | Source: Ambulatory Visit | Attending: Gastroenterology | Admitting: Gastroenterology

## 2018-10-11 ENCOUNTER — Other Ambulatory Visit: Payer: Self-pay

## 2018-10-11 ENCOUNTER — Ambulatory Visit
Admission: RE | Admit: 2018-10-11 | Discharge: 2018-10-11 | Disposition: A | Discharge status: Home / Self Care | Payer: Self-pay | Source: Ambulatory Visit | Attending: Gastroenterology | Admitting: Gastroenterology

## 2018-10-11 DIAGNOSIS — K7031 Alcoholic cirrhosis of liver with ascites: Secondary | ICD-10-CM | POA: Insufficient documentation

## 2018-10-11 LAB — COMPREHENSIVE METABOLIC PANEL
ALT: 33 U/L (ref 0–44)
AST: 37 U/L (ref 15–41)
Albumin: 2.8 g/dL — ABNORMAL LOW (ref 3.5–5.0)
Alkaline Phosphatase: 155 U/L — ABNORMAL HIGH (ref 38–126)
Anion gap: 8 (ref 5–15)
BUN: 44 mg/dL — ABNORMAL HIGH (ref 6–20)
CO2: 23 mmol/L (ref 22–32)
Calcium: 9.4 mg/dL (ref 8.9–10.3)
Chloride: 104 mmol/L (ref 98–111)
Creatinine, Ser: 0.9 mg/dL (ref 0.61–1.24)
GFR calc Af Amer: >60 mL/min (ref >60)
GFR calc non Af Amer: >60 mL/min (ref >60)
Glucose, Bld: 109 mg/dL — ABNORMAL HIGH (ref 70–99)
Potassium: 4.7 mmol/L (ref 3.5–5.1)
Sodium: 135 mmol/L (ref 135–145)
Total Bilirubin: 1.4 mg/dL — ABNORMAL HIGH (ref 0.3–1.2)
Total Protein: 7.2 g/dL (ref 6.5–8.1)

## 2018-10-11 LAB — CBC WITH DIFFERENTIAL/PLATELET
Abs Immature Granulocytes: 0.04 10*3/uL (ref 0.00–0.07)
Basophils Absolute: 0.1 10*3/uL (ref 0.0–0.1)
Basophils Relative: 1 %
Eosinophils Absolute: 0.2 10*3/uL (ref 0.0–0.5)
Eosinophils Relative: 2 %
HCT: 34.1 % — ABNORMAL LOW (ref 39.0–52.0)
Hemoglobin: 11.4 g/dL — ABNORMAL LOW (ref 13.0–17.0)
Immature Granulocytes: 0 %
Lymphocytes Relative: 16 %
Lymphs Abs: 1.5 10*3/uL (ref 0.7–4.0)
MCH: 31.1 pg (ref 26.0–34.0)
MCHC: 33.4 g/dL (ref 30.0–36.0)
MCV: 93.2 fL (ref 80.0–100.0)
Monocytes Absolute: 1.1 10*3/uL — ABNORMAL HIGH (ref 0.1–1.0)
Monocytes Relative: 12 %
Neutro Abs: 6.3 10*3/uL (ref 1.7–7.7)
Neutrophils Relative %: 69 %
Platelets: 211 10*3/uL (ref 150–400)
RBC: 3.66 MIL/uL — ABNORMAL LOW (ref 4.22–5.81)
RDW: 15.2 % (ref 11.5–15.5)
WBC: 9.1 10*3/uL (ref 4.0–10.5)
nRBC: 0 % (ref 0.0–0.2)

## 2018-10-11 LAB — PROTIME-INR
INR: 1.2 (ref 0.8–1.2)
Prothrombin Time: 14.6 seconds (ref 11.4–15.2)

## 2018-10-11 MED ORDER — ALBUMIN HUMAN 25 % IV SOLN
INTRAVENOUS | Status: AC
  Administered 2018-10-11: 14:00:00 25 g via INTRAVENOUS
  Filled 2018-10-11: qty 100

## 2018-10-11 MED ORDER — ALBUMIN HUMAN 25 % IV SOLN
INTRAVENOUS | Status: AC
  Filled 2018-10-11: qty 100

## 2018-10-11 MED ORDER — ALBUMIN HUMAN 25 % IV SOLN
25.0000 g | Freq: Once | INTRAVENOUS | Status: AC
  Administered 2018-10-11: 14:00:00 25 g via INTRAVENOUS

## 2018-10-11 MED ORDER — ALBUMIN HUMAN 25 % IV SOLN: 25.0000 g | Freq: Once | INTRAVENOUS | Status: DC

## 2018-10-12 LAB — AFP TUMOR MARKER: AFP, Serum, Tumor Marker: 2.1 ng/mL (ref 0.0–8.3)

## 2018-10-26 ENCOUNTER — Other Ambulatory Visit: Payer: Self-pay | Admitting: Gastroenterology

## 2018-10-26 DIAGNOSIS — K7031 Alcoholic cirrhosis of liver with ascites: Secondary | ICD-10-CM

## 2018-10-31 ENCOUNTER — Ambulatory Visit
Admission: RE | Admit: 2018-10-31 | Discharge: 2018-10-31 | Disposition: A | Discharge status: Home / Self Care | Payer: Self-pay | Source: Ambulatory Visit | Attending: Gastroenterology | Admitting: Gastroenterology

## 2018-10-31 ENCOUNTER — Other Ambulatory Visit
Admission: RE | Admit: 2018-10-31 | Discharge: 2018-10-31 | Disposition: A | Discharge status: Home / Self Care | Payer: Self-pay | Source: Ambulatory Visit | Attending: Gastroenterology | Admitting: Gastroenterology

## 2018-10-31 ENCOUNTER — Other Ambulatory Visit: Payer: Self-pay

## 2018-10-31 DIAGNOSIS — K7031 Alcoholic cirrhosis of liver with ascites: Secondary | ICD-10-CM | POA: Insufficient documentation

## 2018-10-31 LAB — BODY FLUID CELL COUNT WITH DIFFERENTIAL
Eos, Fluid: 0 %
Lymphs, Fluid: 59 %
Monocyte-Macrophage-Serous Fluid: 26 %
Neutrophil Count, Fluid: 15 %
Total Nucleated Cell Count, Fluid: 87 cu mm

## 2018-10-31 LAB — ALBUMIN, PLEURAL OR PERITONEAL FLUID: Albumin, Fluid: <1 g/dL

## 2018-10-31 LAB — ALBUMIN: Albumin: 3.1 g/dL — ABNORMAL LOW (ref 3.5–5.0)

## 2018-10-31 LAB — SODIUM: Sodium: 135 mmol/L (ref 135–145)

## 2018-10-31 MED ORDER — ALBUMIN HUMAN 25 % IV SOLN
25.0000 g | Freq: Once | INTRAVENOUS | Status: AC
  Administered 2018-10-31: 15:00:00 25 g via INTRAVENOUS

## 2018-10-31 MED ORDER — ALBUMIN HUMAN 25 % IV SOLN
INTRAVENOUS | Status: AC
  Administered 2018-10-31: 15:00:00 25 g via INTRAVENOUS
  Filled 2018-10-31: qty 200

## 2018-11-02 LAB — CYTOLOGY - NON PAP

## 2018-12-05 ENCOUNTER — Other Ambulatory Visit: Payer: Self-pay | Admitting: Gastroenterology

## 2018-12-05 ENCOUNTER — Other Ambulatory Visit: Payer: Self-pay

## 2018-12-05 ENCOUNTER — Other Ambulatory Visit
Admission: RE | Admit: 2018-12-05 | Discharge: 2018-12-05 | Disposition: A | Discharge status: Home / Self Care | Payer: Self-pay | Source: Ambulatory Visit | Attending: Gastroenterology | Admitting: Gastroenterology

## 2018-12-05 ENCOUNTER — Ambulatory Visit
Admission: RE | Admit: 2018-12-05 | Discharge: 2018-12-05 | Disposition: A | Discharge status: Home / Self Care | Payer: Self-pay | Source: Ambulatory Visit | Attending: Gastroenterology | Admitting: Gastroenterology

## 2018-12-05 DIAGNOSIS — K7031 Alcoholic cirrhosis of liver with ascites: Secondary | ICD-10-CM

## 2018-12-05 LAB — BODY FLUID CELL COUNT WITH DIFFERENTIAL
Eos, Fluid: 0 %
Lymphs, Fluid: 37 %
Monocyte-Macrophage-Serous Fluid: 56 %
Neutrophil Count, Fluid: 7 %
Total Nucleated Cell Count, Fluid: 537 cu mm

## 2018-12-05 LAB — ALBUMIN, PLEURAL OR PERITONEAL FLUID: Albumin, Fluid: 1.6 g/dL

## 2018-12-05 LAB — ALBUMIN: Albumin: 3.6 g/dL (ref 3.5–5.0)

## 2018-12-05 LAB — SODIUM: Sodium: 135 mmol/L (ref 135–145)

## 2018-12-05 MED ORDER — ALBUMIN HUMAN 25 % IV SOLN
INTRAVENOUS | Status: AC
  Filled 2018-12-05: qty 100

## 2018-12-05 MED ORDER — ALBUMIN HUMAN 25 % IV SOLN: 25.0000 g | Freq: Once | INTRAVENOUS | Status: DC

## 2018-12-05 NOTE — Procedures (Signed)
Interventional Radiology Procedure:   Indications: Cirrhosis and ascites  Procedure: US guided paracentesis  Findings: Removed 4700 ml from RLQ quadrant  Complications: None     EBL: Less than 10 ml   Timothy Johns R. Anselm Pancoast, MD  Pager: [phone]

## 2018-12-07 LAB — CYTOLOGY - NON PAP

## 2018-12-09 LAB — BODY FLUID CULTURE
Culture: NO GROWTH
Gram Stain: NONE SEEN

## 2019-02-12 ENCOUNTER — Emergency Department: Payer: Self-pay

## 2019-02-12 ENCOUNTER — Other Ambulatory Visit: Payer: Self-pay

## 2019-02-12 ENCOUNTER — Inpatient Hospital Stay
Admission: EM | Admit: 2019-02-12 | Discharge: 2019-02-15 | DRG: 603 | Disposition: A | Discharge status: Home / Self Care | Payer: Self-pay | Attending: Family Medicine | Admitting: Family Medicine

## 2019-02-12 ENCOUNTER — Inpatient Hospital Stay: Payer: Self-pay

## 2019-02-12 DIAGNOSIS — K439 Ventral hernia without obstruction or gangrene: Secondary | ICD-10-CM | POA: Diagnosis present

## 2019-02-12 DIAGNOSIS — L0291 Cutaneous abscess, unspecified: Secondary | ICD-10-CM

## 2019-02-12 DIAGNOSIS — Z833 Family history of diabetes mellitus: Secondary | ICD-10-CM

## 2019-02-12 DIAGNOSIS — Z8261 Family history of arthritis: Secondary | ICD-10-CM

## 2019-02-12 DIAGNOSIS — M1611 Unilateral primary osteoarthritis, right hip: Secondary | ICD-10-CM | POA: Diagnosis present

## 2019-02-12 DIAGNOSIS — Z79899 Other long term (current) drug therapy: Secondary | ICD-10-CM

## 2019-02-12 DIAGNOSIS — Z9049 Acquired absence of other specified parts of digestive tract: Secondary | ICD-10-CM

## 2019-02-12 DIAGNOSIS — K284 Chronic or unspecified gastrojejunal ulcer with hemorrhage: Secondary | ICD-10-CM | POA: Diagnosis present

## 2019-02-12 DIAGNOSIS — R45851 Suicidal ideations: Secondary | ICD-10-CM | POA: Diagnosis present

## 2019-02-12 DIAGNOSIS — Z8719 Personal history of other diseases of the digestive system: Secondary | ICD-10-CM

## 2019-02-12 DIAGNOSIS — Z886 Allergy status to analgesic agent status: Secondary | ICD-10-CM

## 2019-02-12 DIAGNOSIS — I454 Nonspecific intraventricular block: Secondary | ICD-10-CM | POA: Diagnosis present

## 2019-02-12 DIAGNOSIS — Z8049 Family history of malignant neoplasm of other genital organs: Secondary | ICD-10-CM

## 2019-02-12 DIAGNOSIS — F321 Major depressive disorder, single episode, moderate: Secondary | ICD-10-CM

## 2019-02-12 DIAGNOSIS — K274 Chronic or unspecified peptic ulcer, site unspecified, with hemorrhage: Secondary | ICD-10-CM

## 2019-02-12 DIAGNOSIS — I129 Hypertensive chronic kidney disease with stage 1 through stage 4 chronic kidney disease, or unspecified chronic kidney disease: Secondary | ICD-10-CM | POA: Diagnosis present

## 2019-02-12 DIAGNOSIS — Z8249 Family history of ischemic heart disease and other diseases of the circulatory system: Secondary | ICD-10-CM

## 2019-02-12 DIAGNOSIS — L98491 Non-pressure chronic ulcer of skin of other sites limited to breakdown of skin: Secondary | ICD-10-CM | POA: Diagnosis present

## 2019-02-12 DIAGNOSIS — K7031 Alcoholic cirrhosis of liver with ascites: Secondary | ICD-10-CM | POA: Diagnosis present

## 2019-02-12 DIAGNOSIS — R188 Other ascites: Secondary | ICD-10-CM

## 2019-02-12 DIAGNOSIS — F329 Major depressive disorder, single episode, unspecified: Secondary | ICD-10-CM | POA: Diagnosis present

## 2019-02-12 DIAGNOSIS — F1021 Alcohol dependence, in remission: Secondary | ICD-10-CM | POA: Diagnosis present

## 2019-02-12 DIAGNOSIS — Z20822 Contact with and (suspected) exposure to covid-19: Secondary | ICD-10-CM | POA: Diagnosis present

## 2019-02-12 DIAGNOSIS — N1831 Chronic kidney disease, stage 3a: Secondary | ICD-10-CM | POA: Diagnosis present

## 2019-02-12 DIAGNOSIS — E872 Acidosis: Secondary | ICD-10-CM | POA: Diagnosis present

## 2019-02-12 DIAGNOSIS — F17210 Nicotine dependence, cigarettes, uncomplicated: Secondary | ICD-10-CM | POA: Diagnosis present

## 2019-02-12 DIAGNOSIS — L03311 Cellulitis of abdominal wall: Principal | ICD-10-CM | POA: Diagnosis present

## 2019-02-12 DIAGNOSIS — L02211 Cutaneous abscess of abdominal wall: Secondary | ICD-10-CM | POA: Diagnosis present

## 2019-02-12 DIAGNOSIS — Z7982 Long term (current) use of aspirin: Secondary | ICD-10-CM

## 2019-02-12 DIAGNOSIS — R109 Unspecified abdominal pain: Secondary | ICD-10-CM | POA: Insufficient documentation

## 2019-02-12 DIAGNOSIS — K7460 Unspecified cirrhosis of liver: Secondary | ICD-10-CM

## 2019-02-12 LAB — BODY FLUID CELL COUNT WITH DIFFERENTIAL
Eos, Fluid: 0 %
Lymphs, Fluid: 93 %
Monocyte-Macrophage-Serous Fluid: 4 %
Neutrophil Count, Fluid: 3 %
Other Cells, Fluid: 0 %
Total Nucleated Cell Count, Fluid: 310 cu mm

## 2019-02-12 LAB — CBC WITH DIFFERENTIAL/PLATELET
Abs Immature Granulocytes: 0.03 10*3/uL (ref 0.00–0.07)
Basophils Absolute: 0.1 10*3/uL (ref 0.0–0.1)
Basophils Relative: 1 %
Eosinophils Absolute: 0.3 10*3/uL (ref 0.0–0.5)
Eosinophils Relative: 3 %
HCT: 40 % (ref 39.0–52.0)
Hemoglobin: 13.5 g/dL (ref 13.0–17.0)
Immature Granulocytes: 0 %
Lymphocytes Relative: 20 %
Lymphs Abs: 1.9 10*3/uL (ref 0.7–4.0)
MCH: 32.1 pg (ref 26.0–34.0)
MCHC: 33.8 g/dL (ref 30.0–36.0)
MCV: 95.2 fL (ref 80.0–100.0)
Monocytes Absolute: 1 10*3/uL (ref 0.1–1.0)
Monocytes Relative: 11 %
Neutro Abs: 6.4 10*3/uL (ref 1.7–7.7)
Neutrophils Relative %: 65 %
Platelets: 163 10*3/uL (ref 150–400)
RBC: 4.2 MIL/uL — ABNORMAL LOW (ref 4.22–5.81)
RDW: 13.5 % (ref 11.5–15.5)
WBC: 9.7 10*3/uL (ref 4.0–10.5)
nRBC: 0 % (ref 0.0–0.2)

## 2019-02-12 LAB — TYPE AND SCREEN
ABO/RH(D): B POS
Antibody Screen: NEGATIVE

## 2019-02-12 LAB — COMPREHENSIVE METABOLIC PANEL
ALT: 30 U/L (ref 0–44)
AST: 33 U/L (ref 15–41)
Albumin: 3.5 g/dL (ref 3.5–5.0)
Alkaline Phosphatase: 175 U/L — ABNORMAL HIGH (ref 38–126)
Anion gap: 9 (ref 5–15)
BUN: 45 mg/dL — ABNORMAL HIGH (ref 6–20)
CO2: 22 mmol/L (ref 22–32)
Calcium: 10 mg/dL (ref 8.9–10.3)
Chloride: 103 mmol/L (ref 98–111)
Creatinine, Ser: 1.29 mg/dL — ABNORMAL HIGH (ref 0.61–1.24)
GFR calc Af Amer: >60 mL/min (ref >60)
GFR calc non Af Amer: >60 mL/min (ref >60)
Glucose, Bld: 178 mg/dL — ABNORMAL HIGH (ref 70–99)
Potassium: 4.5 mmol/L (ref 3.5–5.1)
Sodium: 134 mmol/L — ABNORMAL LOW (ref 135–145)
Total Bilirubin: 1.7 mg/dL — ABNORMAL HIGH (ref 0.3–1.2)
Total Protein: 7.9 g/dL (ref 6.5–8.1)

## 2019-02-12 LAB — HEMOGLOBIN A1C
Hgb A1c MFr Bld: 5.3 % (ref 4.8–5.6)
Mean Plasma Glucose: 105.41 mg/dL

## 2019-02-12 LAB — GLUCOSE, PLEURAL OR PERITONEAL FLUID: Glucose, Fluid: 109 mg/dL

## 2019-02-12 LAB — RESPIRATORY PANEL BY RT PCR (FLU A&B, COVID)
Influenza A by PCR: NEGATIVE
Influenza B by PCR: NEGATIVE
SARS Coronavirus 2 by RT PCR: NEGATIVE

## 2019-02-12 LAB — LACTIC ACID, PLASMA
Lactic Acid, Venous: 2.2 mmol/L (ref 0.5–1.9)
Lactic Acid, Venous: 1 mmol/L (ref 0.5–1.9)
Lactic Acid, Venous: 1.2 mmol/L (ref 0.5–1.9)

## 2019-02-12 LAB — ETHANOL: Alcohol, Ethyl (B): <10 mg/dL (ref <10)

## 2019-02-12 LAB — LIPASE, BLOOD: Lipase: 18 U/L (ref 11–51)

## 2019-02-12 LAB — PROTIME-INR
INR: 1.2 (ref 0.8–1.2)
Prothrombin Time: 14.7 seconds (ref 11.4–15.2)

## 2019-02-12 LAB — AMMONIA: Ammonia: 23 umol/L (ref 9–35)

## 2019-02-12 LAB — PATHOLOGIST SMEAR REVIEW

## 2019-02-12 MED ORDER — MAGNESIUM OXIDE 400 (241.3 MG) MG PO TABS
400.0000 mg | ORAL_TABLET | Freq: Every day | ORAL | Status: DC
  Administered 2019-02-12 – 2019-02-15 (×4): 400 mg via ORAL
  Filled 2019-02-12 (×4): qty 1

## 2019-02-12 MED ORDER — FUROSEMIDE 40 MG PO TABS
40.0000 mg | ORAL_TABLET | Freq: Every day | ORAL | Status: DC
  Administered 2019-02-13 – 2019-02-15 (×3): 40 mg via ORAL
  Filled 2019-02-12 (×4): qty 1

## 2019-02-12 MED ORDER — IOHEXOL 300 MG/ML  SOLN
100.0000 mL | Freq: Once | INTRAMUSCULAR | Status: AC | PRN
  Administered 2019-02-12: 06:00:00 100 mL via INTRAVENOUS

## 2019-02-12 MED ORDER — VANCOMYCIN HCL IN DEXTROSE 1-5 GM/200ML-% IV SOLN
1000.0000 mg | Freq: Once | INTRAVENOUS | Status: AC
  Administered 2019-02-12: 1000 mg via INTRAVENOUS
  Filled 2019-02-12 (×2): qty 200

## 2019-02-12 MED ORDER — B COMPLEX-C PO TABS
1.0000 | ORAL_TABLET | Freq: Every day | ORAL | Status: DC
  Administered 2019-02-12 – 2019-02-15 (×3): 1 via ORAL
  Filled 2019-02-12 (×5): qty 1

## 2019-02-12 MED ORDER — ONDANSETRON HCL 4 MG PO TABS: 4.0000 mg | ORAL_TABLET | Freq: Four times a day (QID) | ORAL | Status: DC | PRN

## 2019-02-12 MED ORDER — SPIRONOLACTONE 100 MG PO TABS
150.0000 mg | ORAL_TABLET | Freq: Every day | ORAL | Status: DC
  Administered 2019-02-13 – 2019-02-15 (×3): 150 mg via ORAL
  Filled 2019-02-12 (×3): qty 1.5
  Filled 2019-02-12 (×3): qty 6
  Filled 2019-02-12: qty 1.5
  Filled 2019-02-12: qty 6

## 2019-02-12 MED ORDER — SENNOSIDES-DOCUSATE SODIUM 8.6-50 MG PO TABS: 1.0000 | ORAL_TABLET | Freq: Every evening | ORAL | Status: DC | PRN

## 2019-02-12 MED ORDER — SODIUM CHLORIDE 0.9 % IV BOLUS
1000.0000 mL | Freq: Once | INTRAVENOUS | Status: AC
  Administered 2019-02-12: 06:00:00 1000 mL via INTRAVENOUS

## 2019-02-12 MED ORDER — VANCOMYCIN HCL IN DEXTROSE 1-5 GM/200ML-% IV SOLN
1000.0000 mg | Freq: Once | INTRAVENOUS | Status: AC
  Administered 2019-02-12: 12:00:00 1000 mg via INTRAVENOUS
  Filled 2019-02-12 (×2): qty 200

## 2019-02-12 MED ORDER — PIPERACILLIN-TAZOBACTAM 3.375 G IVPB
3.3750 g | Freq: Three times a day (TID) | INTRAVENOUS | Status: DC
  Administered 2019-02-12 – 2019-02-13 (×3): 3.375 g via INTRAVENOUS
  Filled 2019-02-12 (×3): qty 50

## 2019-02-12 MED ORDER — ADULT MULTIVITAMIN W/MINERALS CH
1.0000 | ORAL_TABLET | Freq: Every day | ORAL | Status: DC
  Administered 2019-02-12 – 2019-02-15 (×4): 1 via ORAL
  Filled 2019-02-12 (×4): qty 1

## 2019-02-12 MED ORDER — TRAMADOL HCL 50 MG PO TABS
50.0000 mg | ORAL_TABLET | Freq: Four times a day (QID) | ORAL | Status: DC | PRN
  Administered 2019-02-12 – 2019-02-15 (×4): 50 mg via ORAL
  Filled 2019-02-12 (×4): qty 1

## 2019-02-12 MED ORDER — NICOTINE 21 MG/24HR TD PT24
21.0000 mg | MEDICATED_PATCH | Freq: Every day | TRANSDERMAL | Status: DC
  Administered 2019-02-12 – 2019-02-15 (×4): 21 mg via TRANSDERMAL
  Filled 2019-02-12 (×4): qty 1

## 2019-02-12 MED ORDER — VANCOMYCIN HCL 750 MG/150ML IV SOLN
750.0000 mg | Freq: Two times a day (BID) | INTRAVENOUS | Status: DC
  Administered 2019-02-13: 750 mg via INTRAVENOUS
  Filled 2019-02-12 (×3): qty 150

## 2019-02-12 MED ORDER — PIPERACILLIN-TAZOBACTAM 3.375 G IVPB 30 MIN
3.3750 g | Freq: Once | INTRAVENOUS | Status: AC
  Administered 2019-02-12: 3.375 g via INTRAVENOUS
  Filled 2019-02-12: qty 50

## 2019-02-12 MED ORDER — IOHEXOL 9 MG/ML PO SOLN
500.0000 mL | Freq: Two times a day (BID) | ORAL | Status: DC | PRN
  Administered 2019-02-12: 500 mL via ORAL

## 2019-02-12 MED ORDER — ONDANSETRON HCL 4 MG/2ML IJ SOLN: 4.0000 mg | Freq: Four times a day (QID) | INTRAMUSCULAR | Status: DC | PRN

## 2019-02-12 NOTE — ED Notes (Signed)
Lab called to report T&S has hemolyzed. Will send phlebotomist to redraw and collect ammonia as well as patient is a difficult stick.

## 2019-02-12 NOTE — Progress Notes (Signed)
PHARMACY -  BRIEF ANTIBIOTIC NOTE   Pharmacy has received consult(s) for Vancomycin from an ED provider.  The patient's profile has been reviewed for ht/wt/allergies/indication/available labs.    One time order(s) placed for Vancomycin 2gm (1gm + 1gm)  Further antibiotics/pharmacy consults should be ordered by admitting physician if indicated.                       Thank you, Valrie Hart A 02/12/2019  7:09 AM

## 2019-02-12 NOTE — ED Notes (Signed)
Patient transported to US 

## 2019-02-12 NOTE — Procedures (Signed)
PROCEDURE SUMMARY:  Successful image-guided paracentesis from the left lower abdomen.  Yielded 2.9 liters of clear yellow fluid.  No immediate complications.  EBL: zero Patient tolerated well.   Specimen was not sent for labs.  Please see imaging section of Epic for full dictation.  Villa Herb PA-C 02/12/2019 9:29 AM

## 2019-02-12 NOTE — ED Notes (Signed)
Radiology tech in room for films.

## 2019-02-12 NOTE — Consult Note (Signed)
WOC Nurse Consult Note: Reason for Consult:wound to proximal abdomen above umbilicus.  Patient states the site "burst" this AM with bleeding.  Wound type: inflammatory s/p surgical wound Pressure Injury POA: NA Measurement:4 cm x 3.4 cm x 0.5 cm  Wound bed: pale pink with gelatinous blood to wound bed.  Drainage (amount, consistency, odor) moderate serosanguinous  Heavy bleeding earlier.  Musty odor.  Periwound:intact Dressing procedure/placement/frequency: Cleanse abdominal wound with NS.  Apply Aquacel to wound bed.  Silver will discourage additional bleeding and fight odor. Cover with ABD pad and tape. Change daily.  Will not follow at this time.  Please re-consult if needed.  Maple Hudson MSN, RN, FNP-BC CWON Wound, Ostomy, Continence Nurse Pager [phone]

## 2019-02-12 NOTE — ED Triage Notes (Signed)
Patient to ED for external rupture of hernia. Patient states he is under the care of a doctor and they knew about it but over the last three weeks it has progressively gotten bigger. Bleeding controlled at this time.

## 2019-02-12 NOTE — ED Notes (Signed)
Patient tolerating PO contrast well. Pharmacy tech at bedside at this time.

## 2019-02-12 NOTE — ED Notes (Signed)
Report given to Augusto Gamble, RN on 2c.

## 2019-02-12 NOTE — H&P (Signed)
[facility] [place] NAME: Timothy Johns    MR#:  [id number]  DATE OF BIRTH:  14-Jan-1966  DATE OF ADMISSION:  02/12/2019  PRIMARY CARE PHYSICIAN: Jerrol Banana., MD   REQUESTING/REFERRING PHYSICIAN: Dr. Beather Johns  Patient coming from : home   CHIEF COMPLAINT:  bleeding from rupture of hernia  HISTORY OF PRESENT ILLNESS:  Timothy Johns  is a 54 y.o. male with a known history of alcoholic cirrhosis of liver, ascites most recent paracentesis November 2020, hypertension, history of pancreatitis and history of umbilical hernia for last six months which is progressively getting worse in for three weeks.  Comes into the emergency room with bleeding from ruptured hernia site. He has not seen any physician for this issue. Denies any fever nausea vomiting. Denies any diarrhea. Denies any pain over the hernia site. Complains of generalized abdominal discomfort from status.  ED course: in the emergency room patient is afebrile, pulse is 90, blood pressure 135/80, sats stable on room air. White count 9.7. Lactic acid 2.2. He has ruptured ulcerated area over the mid abdomen which is losing some blood. Some necrotic tissue present. IV antibiotics with vancomycin and Zosyn have been ordered ED MD spoke with Dr. Christian Mate from surgery to see patient.  CT abdomen shows -ventral hernia herniation there is a 11.5 x 2.4 by 6.9 cm rim enhancing fluid collection with some overlying skin thickening and ulceration concerning for possible abscess -Cirrhosis and stigmata of portal venous hypertension with moderate to large volume ascites and splenomegalyDistal esophageal thickening gastric antral thickening and -diffuse pancolonic thickening are all nonspecific findings in the setting of cirrhosis -Circumferential bladder wall thickening, nonspecific intra correlate with urinalysis to exclude cystitis. -Severe destructive changes of the right hip with  articular surface collapse of the femoral head. While findings could reflect sequela of avascular necrosis a complex right hip effusion with features of synovitis   Patient is being admitted for further evaluation management. PAST MEDICAL HISTORY:   Past Medical History:  Diagnosis Date  . Cirrhosis ([place])   . Hypertension     PAST SURGICAL HISTOIRY:   Past Surgical History:  Procedure Laterality Date  . CHOLECYSTECTOMY N/A 05/30/2015   Procedure: LAPAROSCOPIC CHOLECYSTECTOMY WITH INTRAOPERATIVE CHOLANGIOGRAM;  Surgeon: Jules Husbands, MD;  Location: [facility] ORS;  Service: General;  Laterality: N/A;  . PARACENTESIS      SOCIAL HISTORY:   Social History   Tobacco Use  . Smoking status: Current Some Day Smoker    Packs/day: 0.50    Years: 20.00    Pack years: 10.00    Types: Cigarettes  . Smokeless tobacco: Never Used  . Tobacco comment: 1-3 packs a week, he is wearing a patch  Substance Use Topics  . Alcohol use: No    Alcohol/week: 0.0 standard drinks    Comment: Hx of alcohol abuse, quit 8 months ago    FAMILY HISTORY:   Family History  Problem Relation Age of Onset  . Heart disease Mother   . Vaginal cancer Mother   . Arthritis Father   . Diabetes Brother   . Heart disease Maternal Grandfather        dx in her 42s    DRUG ALLERGIES:   Allergies  Allergen Reactions  . Tylenol [Acetaminophen] Other (See Comments)    Pt. states PCP told him not to take because of his liver.    REVIEW OF SYSTEMS:  Review of Systems  Constitutional: Negative for  chills, fever and weight loss.  HENT: Negative for ear discharge, ear pain and nosebleeds.   Eyes: Negative for blurred vision, pain and discharge.  Respiratory: Negative for sputum production, shortness of breath, wheezing and stridor.   Cardiovascular: Negative for chest pain, palpitations, orthopnea and PND.  Gastrointestinal: Positive for abdominal pain. Negative for diarrhea, nausea and vomiting.    Genitourinary: Negative for frequency and urgency.  Musculoskeletal: Negative for back pain and joint pain.  Neurological: Positive for weakness. Negative for sensory change, speech change and focal weakness.  Psychiatric/Behavioral: Negative for depression and hallucinations. The patient is not nervous/anxious.      MEDICATIONS AT HOME:   Prior to Admission medications   Medication Sig Start Date End Date Taking? Authorizing Provider  aspirin EC 325 MG tablet Take 325 mg by mouth daily.   Yes [provider]  b complex vitamins tablet Take 1 tablet by mouth daily.   Yes [provider]  furosemide (LASIX) 40 MG tablet Take 40 mg by mouth daily. 01/27/19  Yes [provider]  glucosamine-chondroitin 500-400 MG tablet Take 1 tablet by mouth 3 (three) times daily.   Yes [provider]  Multiple Vitamin (MULTIVITAMIN) tablet Take 1 tablet by mouth daily.   Yes [provider]  spironolactone (ALDACTONE) 50 MG tablet Take 1 tablet (50 mg total) by mouth 2 (two) times daily. Patient taking differently: Take 150 mg by mouth daily.  08/07/18  Yes Maple Hudson., MD  magnesium oxide (MAG-OX) 400 MG tablet Take 400 mg by mouth daily.    [provider]  thiamine 100 MG tablet Take 100 mg by mouth daily.    [provider]  Vitamin D, Cholecalciferol, 400 units TABS Take 1 tablet by mouth daily.     [provider]      VITAL SIGNS:  Blood pressure 135/80, pulse 96, temperature 98.2 F (36.8 C), temperature source Oral, resp. rate 20, height 5\' 10"  (1.778 m), weight 81.6 kg, SpO2 96 %.  PHYSICAL EXAMINATION:  GENERAL:  54 y.o.-year-old patient lying in the bed with no acute distress.  EYES: Pupils equal, round, reactive to light and accommodation. No scleral icterus.  HEENT: Head atraumatic, normocephalic. Oropharynx and nasopharynx clear.  NECK:  Supple, no jugular venous distention. No thyroid enlargement, no  tenderness.  LUNGS: Normal breath sounds bilaterally, no wheezing, rales,rhonchi or crepitation. No use of accessory muscles of respiration.  CARDIOVASCULAR: S1, S2 normal. No murmurs, rubs, or gallops.  ABDOMEN: Soft,  diffuse tenderness, distended. Bowel sounds present. No organomegaly or mass. Ascites   EXTREMITIES: No pedal edema, cyanosis, or clubbing.  NEUROLOGIC: Cranial nerves II through XII are intact. Muscle strength 5/5 in all extremities. Sensation intact. Gait not checked.  PSYCHIATRIC: The patient is alert and oriented x 3.  SKIN: No obvious rash, lesion, or ulcer.   LABORATORY PANEL:   CBC Recent Labs  Lab 02/12/19 0529  WBC 9.7  HGB 13.5  HCT 40.0  PLT 163   ------------------------------------------------------------------------------------------------------------------  Chemistries  Recent Labs  Lab 02/12/19 0529  NA 134*  K 4.5  CL 103  CO2 22  GLUCOSE 178*  BUN 45*  CREATININE 1.29*  CALCIUM 10.0  AST 33  ALT 30  ALKPHOS 175*  BILITOT 1.7*   ------------------------------------------------------------------------------------------------------------------  Cardiac Enzymes No results for input(s): TROPONINI in the last 168 hours. ------------------------------------------------------------------------------------------------------------------  RADIOLOGY:  CT Abdomen Pelvis W Contrast  Result Date: 02/12/2019 CLINICAL DATA:  Large umbilical hernia, known cirrhosis and  ascites EXAM: CT ABDOMEN AND PELVIS WITH CONTRAST TECHNIQUE: Multidetector CT imaging of the abdomen and pelvis was performed using the standard protocol following bolus administration of intravenous contrast. CONTRAST:  OMNIPAQUE IOHEXOL 300 MG/ML  SOLN COMPARISON:  CT abdomen pelvis 01/08/2026 FINDINGS: Lower chest: Lung bases are clear. Normal heart size. No pericardial effusion. Hepatobiliary: Diffusely nodular hepatic surface contour. No focal hepatic lesions. Patient is post  cholecystectomy. No biliary ductal dilatation or calcified intraductal gallstones. Pancreas: Mild pancreatic atrophy. No focal peripancreatic inflammation or ductal dilatation. Parenchymal calcification may reflect sequela of parenchyma tightest. Spleen: Mild splenomegaly. No focal splenic lesion. Adrenals/Urinary Tract: Adrenal glands are unremarkable. Kidneys are normal, without renal calculi, focal lesion, or hydronephrosis. Circumferential bladder wall thickening, nonspecific with anti dependently layering punctate radiodensity similar to comparison possibly reflecting bladder wall calcification. Stomach/Bowel: Edematous distal esophageal thickening. Mild antral thickening is present as well. No gross bowel dilatation or wall thickening. No evidence of small-bowel obstruction. Mild diffuse edematous thickening of the colon is noted. Portion of the transverse colon comes in close proximity to a small ventral hernia defect through which partially extends a rim enhancing collection in the anterior soft tissues. Unclear if there is contiguity with the bowel itself. No herniation of bowel is seen. Distal colon has a more normal appearance. Vascular/Lymphatic: Atherosclerotic plaque within the normal caliber aorta. Prominent reactive mid mesenteric nodes are present. No pathologically enlarged abdominopelvic lymph nodes. Reproductive: The prostate and seminal vesicles are unremarkable. Other: At the site of reported herniation irregular patulous rim enhancing fluid collection measuring up to 11.5 x 2.4 by 6.9 cm in size with some overlying skin thickening and ulceration. The posterior aspect of this collection is closely associated with a tiny ventral fascial through which this collection partially extends. Connection to the adjacent loop of transverse colon is not fully exclude no frank herniation of bowel is seen. Elsewhere there is diffuse mild body wall edema. There is a moderate to large volume ascites within the  abdomen. No free abdominopelvic air is noted. Musculoskeletal: There is severe destructive changes of the right hip with articular surface collapse of the femoral head. While findings could reflect sequela of avascular necrosis a complex right hip effusion with features of synovitis is present and a septic arthritis cannot be fully excluded. There are multilevel degenerative changes in the spine. IMPRESSION: 1. At the site of reported ventral hernia herniation there is a 11.5 x 2.4 by 6.9 cm rim enhancing fluid collection with some overlying skin thickening and ulceration concerning for possible abscess. The posterior aspect of this collection is closely associated with a tiny ventral fascial through which this collection partially extends. Connection to the adjacent loop of transverse colon is not fully excluded. Notably, this collection does not appear to correspond to a herniated loop of bowel. 2. Cirrhosis and stigmata of portal venous hypertension with moderate to large volume ascites and splenomegaly. 3. Distal esophageal thickening gastric antral thickening and diffuse pancolonic thickening are all nonspecific findings in the setting of cirrhosis, possibly reflecting a combination of portal enteropathy and colopathy. 4. Circumferential bladder wall thickening, nonspecific intra correlate with urinalysis to exclude cystitis. Radiodensity along the anterior wall of the bladder is unchanged from prior and may reflect a bladder wall calcification. 5. Severe destructive changes of the right hip with articular surface collapse of the femoral head. While findings could reflect sequela of avascular necrosis a complex right hip effusion with features of synovitis is present and a septic arthritis cannot be  fully excluded. Fluid sampling is recommended. These results were called by telephone at the time of interpretation on 02/12/2019 at 6:37 am to provider JADE SUNG , who verbally acknowledged these results.  Electronically Signed   By: Kreg Shropshire M.D.   On: 02/12/2019 06:33   DG Chest Port 1 View  Result Date: 02/12/2019 CLINICAL DATA:  Abdominal hernia, bleeding EXAM: PORTABLE CHEST 1 VIEW COMPARISON:  Radiograph 01/09/2016 FINDINGS: Streaky opacities in the bases favor atelectasis. No consolidative opacity, convincing features of edema, pneumothorax or effusion. The cardiomediastinal contours are unremarkable. No acute osseous or soft tissue abnormality. Degenerative changes are present in the imaged spine and shoulders. IMPRESSION: Streaky opacities in the bases favor atelectasis. No other acute cardiopulmonary abnormality. Electronically Signed   By: Kreg Shropshire M.D.   On: 02/12/2019 05:35    EKG:    IMPRESSION AND PLAN:   Aubery Douthat  is a 54 y.o. male with a known history of alcoholic cirrhosis of liver, ascites most recent paracentesis November 2020, hypertension, history of pancreatitis and history of umbilical hernia for last six months which is progressively getting worse in for three weeks. Comes into the emergency room with bleeding from ruptured hernia site.  1. Suspected ruptured ventral hernia with bleeding and possible underlying abscess as noted on CT abdomen -admit patient to MedSurg -IV antibiotics with vancomycin and Zosyn-- pharmacy to dose -Gen. surgery consultation-- ED MD spoke with Dr. Claudine Mouton -wound culture -ID consultation if needed -keeping patient NPO in case he needs procedure  2. Recurrent ascites in the setting of cirrhosis of liver, alcohol related -ultrasound-guided paracentesis -send fluid for cell count, pH, body fluid culture -continue Lasix and Spironolactone -follows with [facility]  3. Mild lactic acid elevation in the setting of infection -monitor levels  4. Suspected UTI/acute cystitis -continue empiric antibiotics -UA and urine culture pending  5. Known history of right hip degenerative arthritis with ?synovitis -patient declines to see  orthopedic at this time  6. DVT prophylaxis -SCD     Family Communication :pt Consults :Surgery Code Status :FULL DVT prophylaxis :SCD for now  TOTAL TIME TAKING CARE OF THIS PATIENT: **50* minutes.    Enedina Finner M.D  [facility]  CC: Primary care physician; Maple Hudson., MD

## 2019-02-12 NOTE — Consult Note (Signed)
[facility] SURGICAL CONSULTATION NOTE (initial) - cpt: [phone]   HISTORY OF PRESENT ILLNESS (HPI):  54 y.o. male presented to [facility] ED today for evaluation of bleeding from "hernia site". Patient reports that he has noticed a bulge near his umbilicus for a few months now. He believed this was a hernia and was trying to wait until after he got a COVID vaccine before he sought medical attention for this. However, last night he reports that he was watching TV when this area began to spontaneously bleed, which prompted his presentation. He reports that this area prior to rupturing would increase and decrease in size with position. He does have a history of alcoholic cirrhosis and receives paracentesis for this, last in November of 2020. No fever, chills, nausea, or emesis. No other complaints. Work up in the ED was concerning for mild lactic acidosis, cirrhosis on CT, and an ulcerated area cephalad to his umbilicus. He was admitted to the medicine service for work up and management.   Surgery is consulted by emergency medicine physician Dr. Lurline Hare, MD in this context for evaluation and management of an ulcerated area cephalad to his umbilicus which has spontaneously started to drain.   PAST MEDICAL HISTORY (PMH):  Past Medical History:  Diagnosis Date  . Cirrhosis ([place])   . Hypertension      PAST SURGICAL HISTORY ([place]):  Past Surgical History:  Procedure Laterality Date  . CHOLECYSTECTOMY N/A 05/30/2015   Procedure: LAPAROSCOPIC CHOLECYSTECTOMY WITH INTRAOPERATIVE CHOLANGIOGRAM;  Surgeon: Jules Husbands, MD;  Location: [facility] ORS;  Service: General;  Laterality: N/A;  . PARACENTESIS       MEDICATIONS:  Prior to Admission medications   Medication Sig Start Date End Date Taking? Authorizing Provider  aspirin EC 325 MG tablet Take 325 mg by mouth daily.   Yes [provider]  b complex vitamins tablet Take 1 tablet by mouth daily.   Yes [provider]    furosemide (LASIX) 40 MG tablet Take 40 mg by mouth daily. 01/27/19  Yes [provider]  glucosamine-chondroitin 500-400 MG tablet Take 1 tablet by mouth 3 (three) times daily.   Yes [provider]  Multiple Vitamin (MULTIVITAMIN) tablet Take 1 tablet by mouth daily.   Yes [provider]  spironolactone (ALDACTONE) 50 MG tablet Take 1 tablet (50 mg total) by mouth 2 (two) times daily. Patient taking differently: Take 150 mg by mouth daily.  08/07/18  Yes Jerrol Banana., MD  magnesium oxide (MAG-OX) 400 MG tablet Take 400 mg by mouth daily.    [provider]  thiamine 100 MG tablet Take 100 mg by mouth daily.    [provider]  Vitamin D, Cholecalciferol, 400 units TABS Take 1 tablet by mouth daily.     [provider]     ALLERGIES:  Allergies  Allergen Reactions  . Tylenol [Acetaminophen] Other (See Comments)    Pt. states PCP told him not to take because of his liver.     SOCIAL HISTORY:  Social History   Socioeconomic History  . Marital status: Divorced    Spouse name: Not on file  . Number of children: Not on file  . Years of education: Not on file  . Highest education level: Not on file  Occupational History  . Not on file  Tobacco Use  . Smoking status: Current Some Day Smoker    Packs/day: 0.50    Years: 20.00    Pack years: 10.00  Types: Cigarettes  . Smokeless tobacco: Never Used  . Tobacco comment: 1-3 packs a week, he is wearing a patch  Substance and Sexual Activity  . Alcohol use: No    Alcohol/week: 0.0 standard drinks    Comment: Hx of alcohol abuse, quit 8 months ago  . Drug use: No    Comment: Hx of marijuana use  . Sexual activity: Not on file  Other Topics Concern  . Not on file  Social History Narrative  . Not on file   Social Determinants of Health   Financial Resource Strain:   . Difficulty of Paying Living Expenses: Not on file  Food Insecurity:   . Worried About Patent examiner in the Last Year: Not on file  . Ran Out of Food in the Last Year: Not on file  Transportation Needs:   . Lack of Transportation (Medical): Not on file  . Lack of Transportation (Non-Medical): Not on file  Physical Activity:   . Days of Exercise per Week: Not on file  . Minutes of Exercise per Session: Not on file  Stress:   . Feeling of Stress : Not on file  Social Connections:   . Frequency of Communication with Friends and Family: Not on file  . Frequency of Social Gatherings with Friends and Family: Not on file  . Attends Religious Services: Not on file  . Active Member of Clubs or Organizations: Not on file  . Attends Banker Meetings: Not on file  . Marital Status: Not on file  Intimate Partner Violence:   . Fear of Current or Ex-Partner: Not on file  . Emotionally Abused: Not on file  . Physically Abused: Not on file  . Sexually Abused: Not on file     FAMILY HISTORY:  Family History  Problem Relation Age of Onset  . Heart disease Mother   . Vaginal cancer Mother   . Arthritis Father   . Diabetes Brother   . Heart disease Maternal Grandfather        dx in her 90s      REVIEW OF SYSTEMS:  Review of Systems  Constitutional: Negative for chills and fever.  HENT: Negative for congestion and sore throat.   Respiratory: Negative for cough and shortness of breath.   Cardiovascular: Negative for chest pain and palpitations.  Gastrointestinal: Negative for abdominal pain, constipation, diarrhea, nausea and vomiting.  Skin:       + Ulceration to the abdomen  All other systems reviewed and are negative.   VITAL SIGNS:  Temp:  [98.2 F (36.8 C)] 98.2 F (36.8 C) (01/25 0512) Pulse Rate:  [96] 96 (01/25 0512) Resp:  [20] 20 (01/25 0512) BP: (135)/(80) 135/80 (01/25 0512) SpO2:  [96 %] 96 % (01/25 0512) Weight:  [81.6 kg] 81.6 kg (01/25 0514)     Height: 5\' 10"  (177.8 cm) Weight: 81.6 kg BMI (Calculated): 25.83   INTAKE/OUTPUT:  No  intake/output data recorded.  PHYSICAL EXAM:  Physical Exam Vitals and nursing note reviewed.  Constitutional:      General: He is not in acute distress.    Appearance: Normal appearance. He is not ill-appearing.  HENT:     Head: Normocephalic and atraumatic.  Eyes:     General: No scleral icterus.    Conjunctiva/sclera: Conjunctivae normal.  Cardiovascular:     Rate and Rhythm: Normal rate and regular rhythm.     Pulses: Normal pulses.     Heart sounds: No murmur. No  friction rub. No gallop.   Pulmonary:     Effort: Pulmonary effort is normal. No respiratory distress.     Breath sounds: Normal breath sounds.  Abdominal:     General: There is no distension.     Palpations: Abdomen is soft.     Tenderness: There is abdominal tenderness in the periumbilical area. There is no guarding or rebound.       Comments: S/P Paracentesis, dressing in place  Genitourinary:    Comments: Deferred Musculoskeletal:        General: Normal range of motion.     Right lower leg: No edema.     Left lower leg: No edema.  Skin:    General: Skin is warm and dry.  Neurological:     General: No focal deficit present.     Mental Status: He is alert and oriented to person, place, and time.  Psychiatric:        Mood and Affect: Mood normal.        Behavior: Behavior normal.      Labs:  CBC Latest Ref Rng & Units 02/12/2019 10/11/2018 08/07/2018  WBC 4.0 - 10.5 K/uL 9.7 9.1 8.4  Hemoglobin 13.0 - 17.0 g/dL 75.3 11.4(L) 11.4(L)  Hematocrit 39.0 - 52.0 % 40.0 34.1(L) 33.3(L)  Platelets 150 - 400 K/uL 163 211 191   CMP Latest Ref Rng & Units 02/12/2019 12/05/2018 10/31/2018  Glucose 70 - 99 mg/dL 005(R) - -  BUN 6 - 20 mg/dL 10(Y) - -  Creatinine 1.11 - 1.24 mg/dL 7.35(A) - -  Sodium 701 - 145 mmol/L 134(L) 135 135  Potassium 3.5 - 5.1 mmol/L 4.5 - -  Chloride 98 - 111 mmol/L 103 - -  CO2 22 - 32 mmol/L 22 - -  Calcium 8.9 - 10.3 mg/dL 41.0 - -  Total Protein 6.5 - 8.1 g/dL 7.9 - -  Total  Bilirubin 0.3 - 1.2 mg/dL 3.0(D) - -  Alkaline Phos 38 - 126 U/L 175(H) - -  AST 15 - 41 U/L 33 - -  ALT 0 - 44 U/L 30 - -     Imaging studies:   CT Abdomen/pelvis (02/12/2019) personally reviewed and agree with radiologist report which is reviewed below:  IMPRESSION: 1. At the site of reported ventral hernia herniation there is a 11.5 x 2.4 by 6.9 cm rim enhancing fluid collection with some overlying skin thickening and ulceration concerning for possible abscess. The posterior aspect of this collection is closely associated with a tiny ventral fascial through which this collection partially extends. Connection to the adjacent loop of transverse colon is not fully excluded. Notably, this collection does not appear to correspond to a herniated loop of bowel. 2. Cirrhosis and stigmata of portal venous hypertension with moderate to large volume ascites and splenomegaly. 3. Distal esophageal thickening gastric antral thickening and diffuse pancolonic thickening are all nonspecific findings in the setting of cirrhosis, possibly reflecting a combination of portal enteropathy and colopathy. 4. Circumferential bladder wall thickening, nonspecific intra correlate with urinalysis to exclude cystitis. Radiodensity along the anterior wall of the bladder is unchanged from prior and may reflect a bladder wall calcification. 5. Severe destructive changes of the right hip with articular surface collapse of the femoral head. While findings could reflect sequela of avascular necrosis a complex right hip effusion with features of synovitis is present and a septic arthritis cannot be fully excluded. Fluid sampling is recommended.   Assessment/Plan: (ICD-10's: K27.4) 54 y.o. male with ulcerative changes  to the skin superior to the umbilicus which appears to be attributable to likely a very small ventral defect which has allowed ascitic fluid to leak into the subcutaneous space which has ruptured  spontaneously, no clear evidence of abscess currently.    - Okay for diet today  - Continue IV Abx  - Consider WOC RN evaluation for wound care recommendations. May need to consider something like Aquacel-Ag dressings daily  - No surgical intervention, will continue to reassess whether or not there remains an undrained abscess. Would like to avoid very invasive surgical intervention as this may result in creation of an external leak for ascitic fluid.    - pain control prn  - paracentesis prn; appreciate IR help  - further management per primary service; we will follow    All of the above findings and recommendations were discussed with the patient, and all of patient's questions were answered to his expressed satisfaction.  Thank you for the opportunity to participate in this patient's care.   -- Lynden Oxford, PA-C  [facility] 02/12/2019, 8:04 AM [phone] M-F: 7am - 4pm

## 2019-02-12 NOTE — Progress Notes (Signed)
Patients BP 94/53 Dr. Allena Katz ordered to hold lasix and aldactone.

## 2019-02-12 NOTE — Progress Notes (Signed)
Dr. Allena Katz orders for patient to have 21mg  nicotine patch. I placed orders.

## 2019-02-12 NOTE — ED Notes (Signed)
Patient in CT scanner at this time.

## 2019-02-12 NOTE — Progress Notes (Signed)
Pharmacy Antibiotic Note  Timothy Johns is a 54 y.o. male admitted on 02/12/2019 with wound infection (external ruptured hernia).  Pharmacy has been consulted for Vancomycin and Zosyn dosing.  Plan: Zosyn 3.375g IV q8h (4 hour infusion).   Vancomycin 750 mg IV Q 12 hrs. Goal AUC 400-550. Expected AUC: 413 SCr used: 1.29 Expected Cmin: 12.4   Will need to continue to monitor renal function. If improves, will likely need to adjust the dose.   Height: 5\' 10"  (177.8 cm) Weight: 180 lb (81.6 kg) IBW/kg (Calculated) : 73  Temp (24hrs), Avg:98.1 F (36.7 C), Min:98 F (36.7 C), Max:98.2 F (36.8 C)  Recent Labs  Lab 02/12/19 0529 02/12/19 0531  WBC 9.7  --   CREATININE 1.29*  --   LATICACIDVEN  --  2.2*    Estimated Creatinine Clearance: 69.2 mL/min (A) (by C-G formula based on SCr of 1.29 mg/dL (H)).    Allergies  Allergen Reactions  . Tylenol [Acetaminophen] Other (See Comments)    Pt. states PCP told him not to take because of his liver.    Antimicrobials this admission: Vancomycin/Zosyn 1/25 >>    Dose adjustments this admission:   Microbiology results: 1/25 BCx: collected   Thank you for allowing pharmacy to be a part of this patient's care.  2/25 02/12/2019 8:10 AM

## 2019-02-12 NOTE — ED Notes (Signed)
Date and time results received: 02/12/19 6:07 AM (use smartphrase ".now" to insert current time)  Test: lactic acid  Critical Value: 2.2  Name of Provider Notified: Lorene Dy Orders Received? Or Actions Taken?:  No new orders at this time

## 2019-02-12 NOTE — ED Provider Notes (Signed)
[facility] Emergency Department Provider Note   ____________________________________________   First MD Initiated Contact with Patient 02/12/19 [phone]     (approximate)  I have reviewed the triage vital signs and the nursing notes.   HISTORY  Chief Complaint Hernia (External rupture)    HPI Timothy Johns is a 54 y.o. male who presents to the ED from home with a chief complaint of bleeding from his hernia.  Patient has a history of alcoholic cirrhosis, hypertension, pancreatitis who last had paracentesis in November 2020.  Reports umbilical hernia x6 months, progressively bigger in the last 3 weeks.  Area began weeping and mildly bleeding 3 days ago.  Patient was trying to wait to see his GI doctor.  Area began to bleed more tonight which prompted his emergency department visit.  Patient denies recent fever, cough, chest pain, shortness of breath, nausea, vomiting or diarrhea.  Denies testicular pain or swelling.  Complains of generalized abdominal pain with swelling.       Past Medical History:  Diagnosis Date  . Cirrhosis ([place])   . Hypertension     Patient Active Problem List   Diagnosis Date Noted  . Acute on chronic cholecystitis 05/31/2015  . Cholelithiasis 05/31/2015  . Leukocytosis 05/31/2015  . Hyperbilirubinemia 05/31/2015  . Dehydration 05/31/2015  . Hypomagnesemia 05/31/2015  . Tobacco abuse counseling 05/31/2015  . Acute pancreatitis 05/27/2015  . History of syncope 05/13/2014  . Tobacco use 05/13/2014  . Alcoholic cirrhosis of liver with ascites ([place]) 05/13/2014  . Alcoholism ([place]) 05/13/2014  . Obesity 05/13/2014  . Insomnia 05/13/2014    Past Surgical History:  Procedure Laterality Date  . CHOLECYSTECTOMY N/A 05/30/2015   Procedure: LAPAROSCOPIC CHOLECYSTECTOMY WITH INTRAOPERATIVE CHOLANGIOGRAM;  Surgeon: Jules Husbands, MD;  Location: [facility] ORS;  Service: General;  Laterality: N/A;  . PARACENTESIS      Prior to Admission  medications   Medication Sig Start Date End Date Taking? Authorizing Provider  aspirin EC 325 MG tablet Take 325 mg by mouth daily.   Yes [provider]  b complex vitamins tablet Take 1 tablet by mouth daily.   Yes [provider]  furosemide (LASIX) 40 MG tablet Take 40 mg by mouth daily. 01/27/19  Yes [provider]  glucosamine-chondroitin 500-400 MG tablet Take 1 tablet by mouth 3 (three) times daily.   Yes [provider]  Multiple Vitamin (MULTIVITAMIN) tablet Take 1 tablet by mouth daily.   Yes [provider]  spironolactone (ALDACTONE) 50 MG tablet Take 1 tablet (50 mg total) by mouth 2 (two) times daily. Patient taking differently: Take 150 mg by mouth daily.  08/07/18  Yes Jerrol Banana., MD  magnesium oxide (MAG-OX) 400 MG tablet Take 400 mg by mouth daily.    [provider]  thiamine 100 MG tablet Take 100 mg by mouth daily.    [provider]  Vitamin D, Cholecalciferol, 400 units TABS Take 1 tablet by mouth daily.     [provider]    Allergies Tylenol [acetaminophen]  Family History  Problem Relation Age of Onset  . Heart disease Mother   . Vaginal cancer Mother   . Arthritis Father   . Diabetes Brother   . Heart disease Maternal Grandfather        dx in her 64s    Social History Social History   Tobacco Use  . Smoking status: Current Some Day Smoker    Packs/day: 0.50  Years: 20.00    Pack years: 10.00    Types: Cigarettes  . Smokeless tobacco: Never Used  . Tobacco comment: 1-3 packs a week, he is wearing a patch  Substance Use Topics  . Alcohol use: No    Alcohol/week: 0.0 standard drinks    Comment: Hx of alcohol abuse, quit 8 months ago  . Drug use: No    Comment: Hx of marijuana use    Review of Systems  Constitutional: No fever/chills Eyes: No visual changes. ENT: No sore throat. Cardiovascular: Denies chest pain. Respiratory: Denies shortness of  breath. Gastrointestinal: Positive for abdominal pain.  Positive for umbilical hernia with weeping and bleeding.  No nausea, no vomiting.  No diarrhea.  No constipation. Genitourinary: Negative for dysuria. Musculoskeletal: Negative for back pain. Skin: Negative for rash. Neurological: Negative for headaches, focal weakness or numbness.   ____________________________________________   PHYSICAL EXAM:  VITAL SIGNS: ED Triage Vitals  Enc Vitals Group     BP      Pulse      Resp      Temp      Temp src      SpO2      Weight      Height      Head Circumference      Peak Flow      Pain Score      Pain Loc      Pain Edu?      Excl. in GC?     Constitutional: Alert and oriented. Well appearing and in mild acute distress. Eyes: Scleral icterus. PERRL. EOMI. Head: Atraumatic. Nose: No congestion/rhinnorhea. Mouth/Throat: Mucous membranes are moist.  Oropharynx non-erythematous. Neck: No stridor.   Cardiovascular: Normal rate, regular rhythm. Grossly normal heart sounds.  Good peripheral circulation. Respiratory: Normal respiratory effort.  No retractions. Lungs CTAB. Gastrointestinal: Very large umbilical hernia on top of moderate to large volume ascites with mild distention.  Skin overlying hernia is cracked and weeping.  There is some venous oozing.  No arterial bleeding.  No abdominal bruits. No CVA tenderness. Musculoskeletal: No lower extremity tenderness nor edema.  No joint effusions. Neurologic:  Normal speech and language. No gross focal neurologic deficits are appreciated. No gait instability. Skin:  Skin is pale, warm, dry and intact. No rash noted. Psychiatric: Mood and affect are normal. Speech and behavior are normal.  ____________________________________________   LABS (all labs ordered are listed, but only abnormal results are displayed)  Labs Reviewed  CBC WITH DIFFERENTIAL/PLATELET - Abnormal; Notable for the following components:      Result Value   RBC  4.20 (*)    All other components within normal limits  COMPREHENSIVE METABOLIC PANEL - Abnormal; Notable for the following components:   Sodium 134 (*)    Glucose, Bld 178 (*)    BUN 45 (*)    Creatinine, Ser 1.29 (*)    Alkaline Phosphatase 175 (*)    Total Bilirubin 1.7 (*)    All other components within normal limits  LACTIC ACID, PLASMA - Abnormal; Notable for the following components:   Lactic Acid, Venous 2.2 (*)    All other components within normal limits  RESPIRATORY PANEL BY RT PCR (FLU A&B, COVID)  CULTURE, BLOOD (ROUTINE X 2)  CULTURE, BLOOD (ROUTINE X 2)  LIPASE, BLOOD  ETHANOL  PROTIME-INR  LACTIC ACID, PLASMA  AMMONIA  URINALYSIS, COMPLETE (UACMP) WITH MICROSCOPIC  TYPE AND SCREEN   ____________________________________________  EKG  ED ECG REPORT I, Bryen Hinderman J,  the attending physician, personally viewed and interpreted this ECG.   Date: 02/12/2019  EKG Time: 0512  Rate: 98  Rhythm: normal EKG, normal sinus rhythm  Axis: LAD  Intervals:nonspecific intraventricular conduction delay  ST&T Change: Nonspecific  ____________________________________________  RADIOLOGY  ED MD interpretation: CT discussed with Dr. Elvera Maria: rim-enhancing fluid collection without bowel involvement, no SBO, diffuse edematous thickening of the colon, diffuse ascites, circumferential bladder wall thickening, right hip effusion with synovitis versus septic arthritis; chest x-ray demonstrates no acute cardiopulmonary process  Official radiology report(s): CT Abdomen Pelvis W Contrast  Result Date: 02/12/2019 CLINICAL DATA:  Large umbilical hernia, known cirrhosis and ascites EXAM: CT ABDOMEN AND PELVIS WITH CONTRAST TECHNIQUE: Multidetector CT imaging of the abdomen and pelvis was performed using the standard protocol following bolus administration of intravenous contrast. CONTRAST:  OMNIPAQUE IOHEXOL 300 MG/ML  SOLN COMPARISON:  CT abdomen pelvis 01/08/2026 FINDINGS: Lower chest:  Lung bases are clear. Normal heart size. No pericardial effusion. Hepatobiliary: Diffusely nodular hepatic surface contour. No focal hepatic lesions. Patient is post cholecystectomy. No biliary ductal dilatation or calcified intraductal gallstones. Pancreas: Mild pancreatic atrophy. No focal peripancreatic inflammation or ductal dilatation. Parenchymal calcification may reflect sequela of parenchyma tightest. Spleen: Mild splenomegaly. No focal splenic lesion. Adrenals/Urinary Tract: Adrenal glands are unremarkable. Kidneys are normal, without renal calculi, focal lesion, or hydronephrosis. Circumferential bladder wall thickening, nonspecific with anti dependently layering punctate radiodensity similar to comparison possibly reflecting bladder wall calcification. Stomach/Bowel: Edematous distal esophageal thickening. Mild antral thickening is present as well. No gross bowel dilatation or wall thickening. No evidence of small-bowel obstruction. Mild diffuse edematous thickening of the colon is noted. Portion of the transverse colon comes in close proximity to a small ventral hernia defect through which partially extends a rim enhancing collection in the anterior soft tissues. Unclear if there is contiguity with the bowel itself. No herniation of bowel is seen. Distal colon has a more normal appearance. Vascular/Lymphatic: Atherosclerotic plaque within the normal caliber aorta. Prominent reactive mid mesenteric nodes are present. No pathologically enlarged abdominopelvic lymph nodes. Reproductive: The prostate and seminal vesicles are unremarkable. Other: At the site of reported herniation irregular patulous rim enhancing fluid collection measuring up to 11.5 x 2.4 by 6.9 cm in size with some overlying skin thickening and ulceration. The posterior aspect of this collection is closely associated with a tiny ventral fascial through which this collection partially extends. Connection to the adjacent loop of transverse  colon is not fully exclude no frank herniation of bowel is seen. Elsewhere there is diffuse mild body wall edema. There is a moderate to large volume ascites within the abdomen. No free abdominopelvic air is noted. Musculoskeletal: There is severe destructive changes of the right hip with articular surface collapse of the femoral head. While findings could reflect sequela of avascular necrosis a complex right hip effusion with features of synovitis is present and a septic arthritis cannot be fully excluded. There are multilevel degenerative changes in the spine. IMPRESSION: 1. At the site of reported ventral hernia herniation there is a 11.5 x 2.4 by 6.9 cm rim enhancing fluid collection with some overlying skin thickening and ulceration concerning for possible abscess. The posterior aspect of this collection is closely associated with a tiny ventral fascial through which this collection partially extends. Connection to the adjacent loop of transverse colon is not fully excluded. Notably, this collection does not appear to correspond to a herniated loop of bowel. 2. Cirrhosis and stigmata of portal venous hypertension with  moderate to large volume ascites and splenomegaly. 3. Distal esophageal thickening gastric antral thickening and diffuse pancolonic thickening are all nonspecific findings in the setting of cirrhosis, possibly reflecting a combination of portal enteropathy and colopathy. 4. Circumferential bladder wall thickening, nonspecific intra correlate with urinalysis to exclude cystitis. Radiodensity along the anterior wall of the bladder is unchanged from prior and may reflect a bladder wall calcification. 5. Severe destructive changes of the right hip with articular surface collapse of the femoral head. While findings could reflect sequela of avascular necrosis a complex right hip effusion with features of synovitis is present and a septic arthritis cannot be fully excluded. Fluid sampling is recommended.  These results were called by telephone at the time of interpretation on 02/12/2019 at 6:37 am to provider Schon Zeiders , who verbally acknowledged these results. Electronically Signed   By: Kreg Shropshire M.D.   On: 02/12/2019 06:33   DG Chest Port 1 View  Result Date: 02/12/2019 CLINICAL DATA:  Abdominal hernia, bleeding EXAM: PORTABLE CHEST 1 VIEW COMPARISON:  Radiograph 01/09/2016 FINDINGS: Streaky opacities in the bases favor atelectasis. No consolidative opacity, convincing features of edema, pneumothorax or effusion. The cardiomediastinal contours are unremarkable. No acute osseous or soft tissue abnormality. Degenerative changes are present in the imaged spine and shoulders. IMPRESSION: Streaky opacities in the bases favor atelectasis. No other acute cardiopulmonary abnormality. Electronically Signed   By: Kreg Shropshire M.D.   On: 02/12/2019 05:35    ____________________________________________   PROCEDURES  Procedure(s) performed (including Critical Care):  Procedures   ____________________________________________   INITIAL IMPRESSION / ASSESSMENT AND PLAN / ED COURSE  As part of my medical decision making, I reviewed the following data within the electronic MEDICAL RECORD NUMBER Nursing notes reviewed and incorporated, Labs reviewed, EKG interpreted, Old chart reviewed, Radiograph reviewed and Notes from prior ED visits     Timothy Johns was evaluated in Emergency Department on 02/12/2019 for the symptoms described in the history of present illness. He was evaluated in the context of the global COVID-19 pandemic, which necessitated consideration that the patient might be at risk for infection with the SARS-CoV-2 virus that causes COVID-19. Institutional protocols and algorithms that pertain to the evaluation of patients at risk for COVID-19 are in a state of rapid change based on information released by regulatory bodies including the CDC and federal and state organizations. These policies  and algorithms were followed during the patient's care in the ED.    54 year old male with a history of alcoholic cirrhosis with last paracentesis in November 2020 who presents with large umbilical hernia with weeping and oozing.  Differential diagnosis includes but is not limited to ascites, incarcerated hernia, cirrhotic flareup, pancreatitis, etc.  Will obtain basic lab work including lactic acid, ammonia and INR.  Obtain CTA abdomen/pelvis to further evaluate umbilical hernia.  Clinical Course as of Feb 12 708  Mon Feb 12, 2019  [id number] Spoke with surgery on-call Dr. Claudine Mouton; given patient's medical complexity including alcoholic cirrhosis, hypertension, ascites requiring paracentesis, request hospital services be contacted for admission.   [JS]  [id number] Updated patient on test results and plan of care.  He has been experiencing right hip pain.  Possible synovitis seen on CT scan.  Likely will require orthopedics consult as well.   [JS]    Clinical Course User Index [JS] Irean Hong, MD     ____________________________________________   FINAL CLINICAL IMPRESSION(S) / ED DIAGNOSES  Final diagnoses:  Cirrhosis of liver with ascites, unspecified  hepatic cirrhosis type (HCC)  Abdominal pain, unspecified abdominal location  Abscess     ED Discharge Orders    None       Note:  This document was prepared using Dragon voice recognition software and may include unintentional dictation errors.   Irean HongSung, Vandy Fong J, MD 02/12/19 [phone]

## 2019-02-13 LAB — CREATININE, SERUM
Creatinine, Ser: 1.35 mg/dL — ABNORMAL HIGH (ref 0.61–1.24)
GFR calc Af Amer: >60 mL/min (ref >60)
GFR calc non Af Amer: 60 mL/min — ABNORMAL LOW (ref >60)

## 2019-02-13 LAB — PH, BODY FLUID: pH, Body Fluid: 7.9

## 2019-02-13 MED ORDER — SODIUM CHLORIDE 0.9 % IV SOLN
1.0000 g | INTRAVENOUS | Status: DC
  Administered 2019-02-13 – 2019-02-14 (×2): 1 g via INTRAVENOUS
  Filled 2019-02-13: qty 1
  Filled 2019-02-13: qty 10
  Filled 2019-02-13: qty 1
  Filled 2019-02-13: qty 10

## 2019-02-13 MED ORDER — TRAZODONE HCL 50 MG PO TABS
50.0000 mg | ORAL_TABLET | Freq: Every evening | ORAL | Status: DC | PRN
  Administered 2019-02-13 – 2019-02-15 (×3): 50 mg via ORAL
  Filled 2019-02-13 (×3): qty 1

## 2019-02-13 NOTE — Progress Notes (Signed)
PROGRESS NOTE    Timothy Johns  KKX:[id number] DOB: 21-Apr-1965 DOA: 02/12/2019 PCP: Jerrol Banana., MD      Brief Narrative:  Timothy Johns is a 54 y.o. M with cirrhosis, recurrent ascites, and HTN who presented with worsening redness and ulceration and leaking of an abdominal skin ulcer with a masslike experience.  In the ER, patient was afebrile, normal white count.  CT showed an 11 x 2 x 6 cm rim-enhancing fluid collection subcutaneously under his ulcerated area on the mid abdomen and was started on IV antibiotics.  General surgery was consulted.       Assessment & Plan:  Ulcer with associated cellulitis Subcutaneous fluid collection, abscess not yet ruled out The patient skin ulcer appears to be related to skin breakdown from massive ascites (several times last year, the patient had to have paracentesis and reports that he had over 20 L at a time removed from his abdomen).  In the context of this ultra-large volume ascites and his prior ventral hernia, he started to develop a bulge in his abdomen, a persistent skin ulcer, which slowly over months became worse and worse until it was necrotic in the center, had surrounding redness and pain, had a mass-like collection subcutaneously and was draining fluid.  General surgery were consulted, and have great reservations about any violation of the skin, prefer to attempt conservative management at this time.  While there was initially some concern about a peritoneal defect with leaking ascites, there appears to be no more leaking of fluid, and so it is unclear if this was actual peritoneal leak/fistula or just weeping from cellulitis.  -Continue antibiotics, narrowed to ceftriaxone -Consult general surgery, appreciate cares   Cirrhosis with ascites -Continue furosemide and spironolactone  CKD stage IIIa Baseline creatinine 1.2-1.3.        Disposition: The patient was admitted with suspected cellulitis of his abdominal  wall.  This is an unusual condition, and is my medical opinion that he should stay in the hospital for continued observation, and IV antibiotics. I will discharge when we have seen his response to several days of IV antibiotics, and the surgery team have decided whether or not to intervene, and close outpatient follow-up should be.        MDM: The below labs and imaging reports were reviewed and summarized above.  Medication management as above.     DVT prophylaxis: SCDs Code Status: Full code Family Communication:     Consultants:   General surgery  Procedures:   1/25 paracentesis  Antimicrobials:   Vancomycin and Zosyn x1 1/25  Ceftriaxone 1/26>>  Culture data:   1/25 blood culture x2 --no growth to date  1/25 body fluid culture --no growth to date          Subjective: Still has tenderness and pain over his wound, there is no more weeping.  He underwent paracentesis yesterday.  No confusion or vomiting.  His abdomen feels okay.  Objective: Vitals:   02/12/19 1200 02/12/19 2114 02/13/19 0600 02/13/19 1400  BP: (!) 94/53 (!) 105/58 (!) 84/52 102/61  Pulse: 60 70 65 63  Resp:  20 18 19   Temp:  98.6 F (37 C) 97.8 F (36.6 C) 97.7 F (36.5 C)  TempSrc:  Oral Oral Oral  SpO2:  96% 94% 98%  Weight:      Height:        Intake/Output Summary (Last 24 hours) at 02/13/2019 1600 Last data filed at 02/13/2019 1526 Gross per  24 hour  Intake 995.77 ml  Output 975 ml  Net 20.77 ml   Filed Weights   02/12/19 0514  Weight: 81.6 kg    Examination: General appearance: Thin, chronically ill-appearing adult male, alert and in no acute distress.  Lying in bed. HEENT: Anicteric, conjunctiva pink, lids and lashes normal. No nasal deformity, discharge, epistaxis.  Lips moist.   Skin: Warm and dry.  No jaundice.  He has a palm sized ulceration in his central abdomen, this has some black crusty tissue in the center, and some surrounding redness and erythema,  and is tender to touch.. Cardiac: RRR, nl S1-S2, no murmurs appreciated.  Capillary refill is brisk.  JVP normal.  No LE edema.  Radial pulses 2+ and symmetric. Respiratory: Normal respiratory rate and rhythm.  CTAB without rales or wheezes. Abdomen: Abdomen soft.  No TTP or guarding. No residual ascites, no significant distension, hepatosplenomegaly.  Ventral hernia noted MSK: No deformities or effusions.  Diminished muscle mass and fat Neuro: Awake and alert.  EOMI, moves all extremities. Speech fluent.    Psych: Sensorium intact and responding to questions, attention normal. Affect normal  Judgment and insight appear normal.    Data Reviewed: I have personally reviewed following labs and imaging studies:  CBC: Recent Labs  Lab 02/12/19 0529  WBC 9.7  NEUTROABS 6.4  HGB 13.5  HCT 40.0  MCV 95.2  PLT 163   Basic Metabolic Panel: Recent Labs  Lab 02/12/19 0529 02/13/19 0518  NA 134*  --   K 4.5  --   CL 103  --   CO2 22  --   GLUCOSE 178*  --   BUN 45*  --   CREATININE 1.29* 1.35*  CALCIUM 10.0  --    GFR: Estimated Creatinine Clearance: 66.1 mL/min (A) (by C-G formula based on SCr of 1.35 mg/dL (H)). Liver Function Tests: Recent Labs  Lab 02/12/19 0529  AST 33  ALT 30  ALKPHOS 175*  BILITOT 1.7*  PROT 7.9  ALBUMIN 3.5   Recent Labs  Lab 02/12/19 0529  LIPASE 18   Recent Labs  Lab 02/12/19 0710  AMMONIA 23   Coagulation Profile: Recent Labs  Lab 02/12/19 0529  INR 1.2   Cardiac Enzymes: No results for input(s): CKTOTAL, CKMB, CKMBINDEX, TROPONINI in the last 168 hours. BNP (last 3 results) No results for input(s): PROBNP in the last 8760 hours. HbA1C: Recent Labs    02/12/19 0529  HGBA1C 5.3   CBG: No results for input(s): GLUCAP in the last 168 hours. Lipid Profile: No results for input(s): CHOL, HDL, LDLCALC, TRIG, CHOLHDL, LDLDIRECT in the last 72 hours. Thyroid Function Tests: No results for input(s): TSH, T4TOTAL, FREET4, T3FREE,  THYROIDAB in the last 72 hours. Anemia Panel: No results for input(s): VITAMINB12, FOLATE, FERRITIN, TIBC, IRON, RETICCTPCT in the last 72 hours. Urine analysis:    Component Value Date/Time   COLORURINE AMBER (A) 05/27/2015 0739   APPEARANCEUR HAZY (A) 05/27/2015 0739   APPEARANCEUR Clear 11/05/2013 0416   LABSPEC 1.033 (H) 05/27/2015 0739   LABSPEC 1.005 11/05/2013 0416   PHURINE 5.0 05/27/2015 0739   GLUCOSEU NEGATIVE 05/27/2015 0739   GLUCOSEU Negative 11/05/2013 0416   HGBUR NEGATIVE 05/27/2015 0739   BILIRUBINUR NEGATIVE 05/27/2015 0739   BILIRUBINUR 1+ 11/05/2013 0416   KETONESUR TRACE (A) 05/27/2015 0739   PROTEINUR 100 (A) 05/27/2015 0739   NITRITE NEGATIVE 05/27/2015 0739   LEUKOCYTESUR NEGATIVE 05/27/2015 0739   LEUKOCYTESUR Negative 11/05/2013 0416  Sepsis Labs: @LABRCNTIP (procalcitonin:4,lacticacidven:4)  ) Recent Results (from the past 240 hour(s))  Culture, blood (routine x 2)     Status: None (Preliminary result)   Collection Time: 02/12/19  7:10 AM   Specimen: BLOOD  Result Value Ref Range Status   Specimen Description BLOOD RIGHT AC  Final   Special Requests   Final    BOTTLES DRAWN AEROBIC AND ANAEROBIC Blood Culture adequate volume   Culture   Final    NO GROWTH < 24 HOURS Performed at [facility], [address] [place], [place] [address]    Report Status PENDING  Incomplete  Culture, blood (routine x 2)     Status: None (Preliminary result)   Collection Time: 02/12/19  7:16 AM   Specimen: BLOOD  Result Value Ref Range Status   Specimen Description BLOOD RIGHT WRIST  Final   Special Requests   Final    BOTTLES DRAWN AEROBIC AND ANAEROBIC Blood Culture adequate volume   Culture   Final    NO GROWTH < 24 HOURS Performed at [facility], [address] [place], [place] [address]    Report Status PENDING  Incomplete  Respiratory Panel by RT PCR (Flu A&B, Covid) - Nasopharyngeal Swab     Status: None   Collection Time:  02/12/19  8:13 AM   Specimen: Nasopharyngeal Swab  Result Value Ref Range Status   SARS Coronavirus 2 by RT PCR NEGATIVE NEGATIVE Final    Comment: (NOTE) SARS-CoV-2 target nucleic acids are NOT DETECTED. The SARS-CoV-2 RNA is generally detectable in upper respiratoy specimens during the acute phase of infection. The lowest concentration of SARS-CoV-2 viral copies this assay can detect is 131 copies/mL. A negative result does not preclude SARS-Cov-2 infection and should not be used as the sole basis for treatment or other patient management decisions. A negative result may occur with  improper specimen collection/handling, submission of specimen other than nasopharyngeal swab, presence of viral mutation(s) within the areas targeted by this assay, and inadequate number of viral copies (<131 copies/mL). A negative result must be combined with clinical observations, patient history, and epidemiological information. The expected result is Negative. Fact Sheet for Patients:  https://www.moore.com/https://www.fda.gov/media/142436/download Fact Sheet for Healthcare Providers:  https://www.young.biz/https://www.fda.gov/media/142435/download This test is not yet ap proved or cleared by the [place] FDA and  has been authorized for detection and/or diagnosis of SARS-CoV-2 by FDA under an Emergency Use Authorization (EUA). This EUA will remain  in effect (meaning this test can be used) for the duration of the COVID-19 declaration under Section 564(b)(1) of the Act, 21 U.S.C. section 360bbb-3(b)(1), unless the authorization is terminated or revoked sooner.    Influenza A by PCR NEGATIVE NEGATIVE Final   Influenza B by PCR NEGATIVE NEGATIVE Final    Comment: (NOTE) The Xpert Xpress SARS-CoV-2/FLU/RSV assay is intended as an aid in  the diagnosis of influenza from Nasopharyngeal swab specimens and  should not be used as a sole basis for treatment. Nasal washings and  aspirates are unacceptable for Xpert Xpress SARS-CoV-2/FLU/RSV    testing. Fact Sheet for Patients: https://www.moore.com/https://www.fda.gov/media/142436/download Fact Sheet for Healthcare Providers: https://www.young.biz/https://www.fda.gov/media/142435/download This test is not yet approved or cleared by the [place] FDA and  has been authorized for detection and/or diagnosis of SARS-CoV-2 by  FDA under an Emergency Use Authorization (EUA). This EUA will remain  in effect (meaning this test can be used) for the duration of the  Covid-19 declaration under Section 564(b)(1) of the Act, 21  U.S.C. section  360bbb-3(b)(1), unless the authorization is  terminated or revoked. Performed at [facility], [address] [place], [place] [address]   Body fluid culture     Status: None (Preliminary result)   Collection Time: 02/12/19  9:30 AM   Specimen: PATH Cytology Peritoneal fluid  Result Value Ref Range Status   Specimen Description   Final    PERITONEAL Performed at [facility], [address] [place], [place] [address]    Special Requests   Final    NONE Performed at [facility], [address] [place], [place] [address]    Gram Stain   Final    FEW WBC PRESENT, PREDOMINANTLY MONONUCLEAR NO ORGANISMS SEEN    Culture   Final    NO GROWTH < 24 HOURS Performed at [facility], [address], [place], [place] [address]    Report Status PENDING  Incomplete         Radiology Studies: CT Abdomen Pelvis W Contrast  Result Date: 02/12/2019 CLINICAL DATA:  Large umbilical hernia, known cirrhosis and ascites EXAM: CT ABDOMEN AND PELVIS WITH CONTRAST TECHNIQUE: Multidetector CT imaging of the abdomen and pelvis was performed using the standard protocol following bolus administration of intravenous contrast. CONTRAST:  OMNIPAQUE IOHEXOL 300 MG/ML  SOLN COMPARISON:  CT abdomen pelvis 01/08/2026 FINDINGS: Lower chest: Lung bases are clear. Normal heart size. No pericardial effusion. Hepatobiliary: Diffusely nodular hepatic surface contour. No  focal hepatic lesions. Patient is post cholecystectomy. No biliary ductal dilatation or calcified intraductal gallstones. Pancreas: Mild pancreatic atrophy. No focal peripancreatic inflammation or ductal dilatation. Parenchymal calcification may reflect sequela of parenchyma tightest. Spleen: Mild splenomegaly. No focal splenic lesion. Adrenals/Urinary Tract: Adrenal glands are unremarkable. Kidneys are normal, without renal calculi, focal lesion, or hydronephrosis. Circumferential bladder wall thickening, nonspecific with anti dependently layering punctate radiodensity similar to comparison possibly reflecting bladder wall calcification. Stomach/Bowel: Edematous distal esophageal thickening. Mild antral thickening is present as well. No gross bowel dilatation or wall thickening. No evidence of small-bowel obstruction. Mild diffuse edematous thickening of the colon is noted. Portion of the transverse colon comes in close proximity to a small ventral hernia defect through which partially extends a rim enhancing collection in the anterior soft tissues. Unclear if there is contiguity with the bowel itself. No herniation of bowel is seen. Distal colon has a more normal appearance. Vascular/Lymphatic: Atherosclerotic plaque within the normal caliber aorta. Prominent reactive mid mesenteric nodes are present. No pathologically enlarged abdominopelvic lymph nodes. Reproductive: The prostate and seminal vesicles are unremarkable. Other: At the site of reported herniation irregular patulous rim enhancing fluid collection measuring up to 11.5 x 2.4 by 6.9 cm in size with some overlying skin thickening and ulceration. The posterior aspect of this collection is closely associated with a tiny ventral fascial through which this collection partially extends. Connection to the adjacent loop of transverse colon is not fully exclude no frank herniation of bowel is seen. Elsewhere there is diffuse mild body wall edema. There is a  moderate to large volume ascites within the abdomen. No free abdominopelvic air is noted. Musculoskeletal: There is severe destructive changes of the right hip with articular surface collapse of the femoral head. While findings could reflect sequela of avascular necrosis a complex right hip effusion with features of synovitis is present and a septic arthritis cannot be fully excluded. There are multilevel degenerative changes in the spine. IMPRESSION: 1. At the site of reported ventral hernia herniation there is a  11.5 x 2.4 by 6.9 cm rim enhancing fluid collection with some overlying skin thickening and ulceration concerning for possible abscess. The posterior aspect of this collection is closely associated with a tiny ventral fascial through which this collection partially extends. Connection to the adjacent loop of transverse colon is not fully excluded. Notably, this collection does not appear to correspond to a herniated loop of bowel. 2. Cirrhosis and stigmata of portal venous hypertension with moderate to large volume ascites and splenomegaly. 3. Distal esophageal thickening gastric antral thickening and diffuse pancolonic thickening are all nonspecific findings in the setting of cirrhosis, possibly reflecting a combination of portal enteropathy and colopathy. 4. Circumferential bladder wall thickening, nonspecific intra correlate with urinalysis to exclude cystitis. Radiodensity along the anterior wall of the bladder is unchanged from prior and may reflect a bladder wall calcification. 5. Severe destructive changes of the right hip with articular surface collapse of the femoral head. While findings could reflect sequela of avascular necrosis a complex right hip effusion with features of synovitis is present and a septic arthritis cannot be fully excluded. Fluid sampling is recommended. These results were called by telephone at the time of interpretation on 02/12/2019 at 6:37 am to provider JADE SUNG , who  verbally acknowledged these results. Electronically Signed   By: Kreg Shropshire M.D.   On: 02/12/2019 06:33   US Paracentesis  Result Date: 02/12/2019 INDICATION: Patient with history of ETOH cirrhosis, recurrent ascites who presented to [facility] ED today with complaints of bleeding from hernia. IR has been asked to perform a therapeutic paracentesis. EXAM: ULTRASOUND GUIDED THERAPEUTIC PARACENTESIS MEDICATIONS: 17 mL!% lidocaine COMPLICATIONS: None immediate. PROCEDURE: Informed written consent was obtained from the patient after a discussion of the risks, benefits and alternatives to treatment. A timeout was performed prior to the initiation of the procedure. Initial ultrasound scanning demonstrates a moderate amount of ascites within the left lower abdominal quadrant. The left lower abdomen was prepped and draped in the usual sterile fashion. 1% lidocaine was used for local anesthesia. Following this, a 6 Fr Safe-T-Centesis catheter was introduced. An ultrasound image was saved for documentation purposes. The paracentesis was performed. The catheter was removed and a dressing was applied. The patient tolerated the procedure well without immediate post procedural complication. FINDINGS: A total of approximately 2.9 L of clear yellow fluid was removed. IMPRESSION: Successful ultrasound-guided paracentesis yielding 2.9 liters of peritoneal fluid. Read by Lynnette Caffey, PA-C Electronically Signed   By: Corlis Leak M.D.   On: 02/12/2019 09:50   DG Chest Port 1 View  Result Date: 02/12/2019 CLINICAL DATA:  Abdominal hernia, bleeding EXAM: PORTABLE CHEST 1 VIEW COMPARISON:  Radiograph 01/09/2016 FINDINGS: Streaky opacities in the bases favor atelectasis. No consolidative opacity, convincing features of edema, pneumothorax or effusion. The cardiomediastinal contours are unremarkable. No acute osseous or soft tissue abnormality. Degenerative changes are present in the imaged spine and shoulders. IMPRESSION: Streaky  opacities in the bases favor atelectasis. No other acute cardiopulmonary abnormality. Electronically Signed   By: Kreg Shropshire M.D.   On: 02/12/2019 05:35        Scheduled Meds: . B-complex with vitamin C  1 tablet Oral Daily  . furosemide  40 mg Oral Daily  . magnesium oxide  400 mg Oral Daily  . multivitamin with minerals  1 tablet Oral Daily  . nicotine  21 mg Transdermal Daily  . spironolactone  150 mg Oral Daily   Continuous Infusions: . cefTRIAXone (ROCEPHIN)  IV Stopped (02/13/19 1431)  LOS: 1 day    Time spent: 25 minutes    Alberteen Samhristopher P Ladona Rosten, MD [facility] 02/13/2019, 4:00 PM     Please page though AMION or Epic secure chat:  For [employer] password, Higher education careers advisercontact charge nurse

## 2019-02-13 NOTE — Progress Notes (Signed)
[facility] SUR[facility] PROGRESS NOTE (cpt [phone])  Hospital Day(s): 1.   Interval History: Patient seen and examined, no acute events or new complaints overnight. Patient reports he is feeling better and pain at wound site is improved. He is still very anxious. No fever, chills, nausea, or emesis. His drainage has lessened since onset. Tolerating dressing changes.   Review of Systems:  Constitutional: denies fever, chills  HEENT: denies cough or congestion  Respiratory: denies any shortness of breath  Cardiovascular: denies chest pain or palpitations  Gastrointestinal: denies abdominal pain, N/V, or diarrhea/and bowel function as per interval history Genitourinary: denies burning with urination or urinary frequency Musculoskeletal: denies pain, decreased motor or sensation Integumentary:+ skin wound  Vital signs in last 24 hours: [min-max] current  Temp:  [97.8 F (36.6 C)-98.6 F (37 C)] 97.8 F (36.6 C) (01/26 0600) Pulse Rate:  [60-70] 65 (01/26 0600) Resp:  [18-20] 18 (01/26 0600) BP: (83-107)/(48-63) 84/52 (01/26 0600) SpO2:  [94 %-100 %] 94 % (01/26 0600)     Height: 5\' 10"  (177.8 cm) Weight: 81.6 kg BMI (Calculated): 25.83   Intake/Output last 2 shifts:  01/25 0701 - 01/26 0700 In: 1765.8 [P.O.:120; IV Piggyback:1645.8] Out: 775 [Urine:775]   Physical Exam:  Constitutional: alert, cooperative and no distress  HENT: normocephalic without obvious abnormality  Eyes: PERRL, EOM's grossly intact and symmetric   Respiratory: breathing non-labored at rest  Cardiovascular: regular rate and sinus rhythm  Gastrointestinal: soft, non-tender, and non-distended Integumentary: Large ulcerated area superior to the umbilicus, erythema appears improved compared to yesterday, slight fibrinous drainage, no purulence or fluctuance.    Labs:  CBC Latest Ref Rng & Units 02/12/2019 10/11/2018 08/07/2018  WBC 4.0 - 10.5 K/uL 9.7 9.1 8.4  Hemoglobin 13.0 - 17.0 g/dL 13.5 11.4(L)  11.4(L)  Hematocrit 39.0 - 52.0 % 40.0 34.1(L) 33.3(L)  Platelets 150 - 400 K/uL 163 211 191   CMP Latest Ref Rng & Units 02/13/2019 02/12/2019 12/05/2018  Glucose 70 - 99 mg/dL - 178(H) -  BUN 6 - 20 mg/dL - 45(H) -  Creatinine 0.61 - 1.24 mg/dL 1.35(H) 1.29(H) -  Sodium 135 - 145 mmol/L - 134(L) 135  Potassium 3.5 - 5.1 mmol/L - 4.5 -  Chloride 98 - 111 mmol/L - 103 -  CO2 22 - 32 mmol/L - 22 -  Calcium 8.9 - 10.3 mg/dL - 10.0 -  Total Protein 6.5 - 8.1 g/dL - 7.9 -  Total Bilirubin 0.3 - 1.2 mg/dL - 1.7(H) -  Alkaline Phos 38 - 126 U/L - 175(H) -  AST 15 - 41 U/L - 33 -  ALT 0 - 44 U/L - 30 -     Imaging studies: No new pertinent imaging studies   Assessment/Plan: (ICD-10's: K27.4) 54 y.o. male with ulcerative changes to the skin superior to the umbilicus which appears to be attributable to likely a very small ventral defect which has allowed ascitic fluid to leak into the subcutaneous space which has ruptured spontaneously, no clear evidence of abscess currently.    - Continue IV Abx  - Continue local wound care; daily dressing changes PRN if saturating through; use Aquacel Ag; okay to irrigate with NS   - No surgical intervention, will continue to reassess whether or not there remains an undrained abscess. Would like to avoid very invasive surgical intervention as this may result in creation of an external leak for ascitic fluid.               -  pain control prn             - paracentesis prn; appreciate IR help             - further management per primary service; we will follow    All of the above findings and recommendations were discussed with the patient, and the medical team, and all of patient's questions were answered to his expressed satisfaction.  -- Lynden Oxford, PA-C [facility] 02/13/2019, 9:23 AM [phone] M-F: 7am - 4pm

## 2019-02-14 DIAGNOSIS — F321 Major depressive disorder, single episode, moderate: Secondary | ICD-10-CM

## 2019-02-14 LAB — CBC
HCT: 32.7 % — ABNORMAL LOW (ref 39.0–52.0)
Hemoglobin: 11.3 g/dL — ABNORMAL LOW (ref 13.0–17.0)
MCH: 32.6 pg (ref 26.0–34.0)
MCHC: 34.6 g/dL (ref 30.0–36.0)
MCV: 94.2 fL (ref 80.0–100.0)
Platelets: 155 10*3/uL (ref 150–400)
RBC: 3.47 MIL/uL — ABNORMAL LOW (ref 4.22–5.81)
RDW: 13.2 % (ref 11.5–15.5)
WBC: 8.4 10*3/uL (ref 4.0–10.5)
nRBC: 0 % (ref 0.0–0.2)

## 2019-02-14 LAB — COMPREHENSIVE METABOLIC PANEL
ALT: 27 U/L (ref 0–44)
AST: 26 U/L (ref 15–41)
Albumin: 3 g/dL — ABNORMAL LOW (ref 3.5–5.0)
Alkaline Phosphatase: 139 U/L — ABNORMAL HIGH (ref 38–126)
Anion gap: 8 (ref 5–15)
BUN: 44 mg/dL — ABNORMAL HIGH (ref 6–20)
CO2: 23 mmol/L (ref 22–32)
Calcium: 9.7 mg/dL (ref 8.9–10.3)
Chloride: 105 mmol/L (ref 98–111)
Creatinine, Ser: 1.33 mg/dL — ABNORMAL HIGH (ref 0.61–1.24)
GFR calc Af Amer: >60 mL/min (ref >60)
GFR calc non Af Amer: >60 mL/min (ref >60)
Glucose, Bld: 107 mg/dL — ABNORMAL HIGH (ref 70–99)
Potassium: 4.6 mmol/L (ref 3.5–5.1)
Sodium: 136 mmol/L (ref 135–145)
Total Bilirubin: 1.4 mg/dL — ABNORMAL HIGH (ref 0.3–1.2)
Total Protein: 7 g/dL (ref 6.5–8.1)

## 2019-02-14 MED ORDER — SODIUM CHLORIDE 0.9 % IV SOLN
INTRAVENOUS | Status: DC | PRN
  Administered 2019-02-14: 1000 mL via INTRAVENOUS

## 2019-02-14 MED ORDER — ESCITALOPRAM OXALATE 10 MG PO TABS: 10.0000 mg | ORAL_TABLET | Freq: Every day | ORAL | Status: DC

## 2019-02-14 MED ORDER — ESCITALOPRAM OXALATE 10 MG PO TABS
10.0000 mg | ORAL_TABLET | Freq: Every day | ORAL | Status: DC
  Administered 2019-02-15: 10:00:00 10 mg via ORAL
  Filled 2019-02-14: qty 1

## 2019-02-14 NOTE — Consult Note (Signed)
[facility] Face-to-Face Psychiatry Consult   Reason for Consult: Depression and suicidal ideation Referring Physician: Dr. Maryfrances Bunnell Patient Identification: Timothy Johns MRN:  [id number] Principal Diagnosis: <principal problem not specified> Diagnosis:  Active Problems:   Ascites due to alcoholic cirrhosis (HCC)   Bleeding ulcer   Total Time spent with patient: 1 hour  Subjective:   Timothy Johns is a 54 y.o. male patient who presented with complications of ascites.  Patient made some vague suicidal statements and psychiatry was consulted  HPI: Patient is a 54 year old well spoken gentleman who came to the hospital with complications of ascites.  Patient made a vague suicidal claim this morning and psychiatry was consulted.  Patient states that his issues started approximately 20 years ago after a divorce.  Patient states that he then started drinking to an unhealthy level which eventually led to issues with pancreatitis.  Since that time patient has been battling numerous medical problems surrounding the pancreatitis including ascites and questionable cyst/ulcer.  Patient states that recently over the past year his condition has deteriorated and he found himself frequently in pain as well as feeling depressed.  Patient states that his isolation as well as social stressors and the overall stress of the pandemic contributed to this.  During this past year patient endorses low mood, sleeping difficulties, low energy as well as occasional suicidal ideation.  Patient denies ever having intent to act on these thoughts.  He stated that he had thought of hanging himself in the past but denies ever planning to do so.   Patient denies manic symptoms, psychosis, delusions.  A discussion was held about the next appropriate steps for management and patient reiterated that he felt safe to continue to seek psychiatric care on an outpatient basis.  Medications were also discussed as well as the benefit of  starting on a SSRI medication as early as tomorrow in order to expediate psychiatric care.  Patient is in agreement with this plan to contact family care provider to obtain referral.   Past Psychiatric History: No past suicide attempts or psychiatric hospitalizations.  Patient had formally taken Wellbutrin for smoking cessation approximately 15 to 20 years ago  Risk to Self:  No Risk to Others:  No Prior Inpatient Therapy:  No Prior Outpatient Therapy:  No  Past Medical History:  Past Medical History:  Diagnosis Date  . Cirrhosis (HCC)   . Hypertension     Past Surgical History:  Procedure Laterality Date  . CHOLECYSTECTOMY N/A 05/30/2015   Procedure: LAPAROSCOPIC CHOLECYSTECTOMY WITH INTRAOPERATIVE CHOLANGIOGRAM;  Surgeon: Leafy Ro, MD;  Location: [facility] ORS;  Service: General;  Laterality: N/A;  . PARACENTESIS     Family History:  Family History  Problem Relation Age of Onset  . Heart disease Mother   . Vaginal cancer Mother   . Arthritis Father   . Diabetes Brother   . Heart disease Maternal Grandfather        dx in her 79s   Family Psychiatric  History: Denies Social History:  Social History   Substance and Sexual Activity  Alcohol Use No  . Alcohol/week: 0.0 standard drinks   Comment: Hx of alcohol abuse, quit 8 months ago     Social History   Substance and Sexual Activity  Drug Use No   Comment: Hx of marijuana use    Social History   Socioeconomic History  . Marital status: Divorced    Spouse name: Not on file  . Number of children: Not on  file  . Years of education: Not on file  . Highest education level: Not on file  Occupational History  . Not on file  Tobacco Use  . Smoking status: Current Some Johns Smoker    Packs/Johns: 0.50    Years: 20.00    Pack years: 10.00    Types: Cigarettes  . Smokeless tobacco: Never Used  . Tobacco comment: 1-3 packs a week, he is wearing a patch  Substance and Sexual Activity  . Alcohol use: No    Alcohol/week:  0.0 standard drinks    Comment: Hx of alcohol abuse, quit 8 months ago  . Drug use: No    Comment: Hx of marijuana use  . Sexual activity: Not on file  Other Topics Concern  . Not on file  Social History Narrative  . Not on file   Social Determinants of Health   Financial Resource Strain:   . Difficulty of Paying Living Expenses: Not on file  Food Insecurity:   . Worried About Programme researcher, broadcasting/film/video in the Last Year: Not on file  . Ran Out of Food in the Last Year: Not on file  Transportation Needs:   . Lack of Transportation (Medical): Not on file  . Lack of Transportation (Non-Medical): Not on file  Physical Activity:   . Days of Exercise per Week: Not on file  . Minutes of Exercise per Session: Not on file  Stress:   . Feeling of Stress : Not on file  Social Connections:   . Frequency of Communication with Friends and Family: Not on file  . Frequency of Social Gatherings with Friends and Family: Not on file  . Attends Religious Services: Not on file  . Active Member of Clubs or Organizations: Not on file  . Attends Banker Meetings: Not on file  . Marital Status: Not on file   Additional Social History: Patient formerly worked as a Animator.  He currently lives in a home by himself with his dog.  He is supported by family in the surrounding areas.  He formerly had a problem with alcohol use.  He smokes about a pack and half cigarettes per Johns.    Allergies:   Allergies  Allergen Reactions  . Tylenol [Acetaminophen] Other (See Comments)    Pt. states PCP told him not to take because of his liver.    Labs:  Results for orders placed or performed during the hospital encounter of 02/12/19 (from the past 48 hour(s))  Creatinine, serum     Status: Abnormal   Collection Time: 02/13/19  5:18 AM  Result Value Ref Range   Creatinine, Ser 1.35 (H) 0.61 - 1.24 mg/dL   GFR calc non Af Amer 60 (L) >60 mL/min   GFR calc Af Amer >60 >60 mL/min    Comment:  Performed at [facility], [address] [place], [place] [address]  CBC     Status: Abnormal   Collection Time: 02/14/19  4:40 AM  Result Value Ref Range   WBC 8.4 4.0 - 10.5 K/uL   RBC 3.47 (L) 4.22 - 5.81 MIL/uL   Hemoglobin 11.3 (L) 13.0 - 17.0 g/dL   HCT 12.8 (L) 78.6 - 76.7 %   MCV 94.2 80.0 - 100.0 fL   MCH 32.6 26.0 - 34.0 pg   MCHC 34.6 30.0 - 36.0 g/dL   RDW 20.9 47.0 - 96.2 %   Platelets 155 150 - 400 K/uL   nRBC 0.0 0.0 -  0.2 %    Comment: Performed at [facility], [address] [place], [place] [address]  Comprehensive metabolic panel     Status: Abnormal   Collection Time: 02/14/19  4:40 AM  Result Value Ref Range   Sodium 136 135 - 145 mmol/L   Potassium 4.6 3.5 - 5.1 mmol/L   Chloride 105 98 - 111 mmol/L   CO2 23 22 - 32 mmol/L   Glucose, Bld 107 (H) 70 - 99 mg/dL   BUN 44 (H) 6 - 20 mg/dL   Creatinine, Ser 5.18 (H) 0.61 - 1.24 mg/dL   Calcium 9.7 8.9 - 84.1 mg/dL   Total Protein 7.0 6.5 - 8.1 g/dL   Albumin 3.0 (L) 3.5 - 5.0 g/dL   AST 26 15 - 41 U/L   ALT 27 0 - 44 U/L   Alkaline Phosphatase 139 (H) 38 - 126 U/L   Total Bilirubin 1.4 (H) 0.3 - 1.2 mg/dL   GFR calc non Af Amer >60 >60 mL/min   GFR calc Af Amer >60 >60 mL/min   Anion gap 8 5 - 15    Comment: Performed at [facility], [address], [place], [place] [address]    Current Facility-Administered Medications  Medication Dose Route Frequency Provider Last Rate Last Admin  . 0.9 %  sodium chloride infusion   Intravenous PRN Alberteen Sam, MD 10 mL/hr at 02/14/19 1321 1,000 mL at 02/14/19 1321  . B-complex with vitamin C tablet 1 tablet  1 tablet Oral Daily Enedina Finner, MD   1 tablet at 02/14/19 1047  . cefTRIAXone (ROCEPHIN) 1 g in sodium chloride 0.9 % 100 mL IVPB  1 g Intravenous Q24H Alberteen Sam, MD 200 mL/hr at 02/14/19 1323 1 g at 02/14/19 1323  . [START ON 02/15/2019] escitalopram (LEXAPRO) tablet 10 mg  10 mg Oral Daily Kashira Behunin  A, MD      . furosemide (LASIX) tablet 40 mg  40 mg Oral Daily Enedina Finner, MD   40 mg at 02/14/19 1047  . iohexol (OMNIPAQUE) 9 MG/ML oral solution 500 mL  500 mL Oral BID PRN Irean Hong, MD   500 mL at 02/12/19 0537  . magnesium oxide (MAG-OX) tablet 400 mg  400 mg Oral Daily Enedina Finner, MD   400 mg at 02/14/19 1046  . multivitamin with minerals tablet 1 tablet  1 tablet Oral Daily Enedina Finner, MD   1 tablet at 02/14/19 1047  . nicotine (NICODERM CQ - dosed in mg/24 hours) patch 21 mg  21 mg Transdermal Daily Enedina Finner, MD   21 mg at 02/14/19 1047  . ondansetron (ZOFRAN) tablet 4 mg  4 mg Oral Q6H PRN Enedina Finner, MD       Or  . ondansetron [facility]) injection 4 mg  4 mg Intravenous Q6H PRN Enedina Finner, MD      . senna-docusate (Senokot-S) tablet 1 tablet  1 tablet Oral QHS PRN Enedina Finner, MD      . spironolactone (ALDACTONE) tablet 150 mg  150 mg Oral Daily Enedina Finner, MD   150 mg at 02/14/19 1047  . traMADol (ULTRAM) tablet 50 mg  50 mg Oral Q6H PRN Enedina Finner, MD   50 mg at 02/13/19 1633  . traZODone (DESYREL) tablet 50 mg  50 mg Oral QHS PRN Jimmye Norman, NP   50 mg at 02/13/19 2300    Musculoskeletal: Strength & Muscle Tone: within normal limits Gait & Station: normal  Patient leans: N/A  Psychiatric Specialty Exam: Physical Exam  Review of Systems  Psychiatric/Behavioral: Positive for dysphoric mood, hallucinations, sleep disturbance and suicidal ideas. Negative for agitation, behavioral problems, confusion and self-injury. The patient is not hyperactive.     Blood pressure 100/64, pulse 67, temperature 98 F (36.7 C), temperature source Oral, resp. rate 14, height 5\' 10"  (1.778 m), weight 81.6 kg, SpO2 94 %.Body mass index is 25.83 kg/m.  General Appearance: Fairly Groomed  Eye Contact:  Good  Speech:  Clear and Coherent  Volume:  Normal  Mood:  Depressed  Affect:  Congruent  Thought Process:  Coherent  Orientation:  Full (Time, Place, and Person)   Thought Content:  Logical  Suicidal Thoughts:  Yes.  without intent/plan  Homicidal Thoughts:  No  Memory:  Recent;   Fair  Judgement:  Intact  Insight:  Good  Psychomotor Activity:  Normal  Concentration:  Concentration: Fair  Recall:  Good  Fund of Knowledge:  Good  Language:  Good  Akathisia:  No  Handed:  Right  AIMS (if indicated):     Assets:  Communication Skills Desire for Improvement Financial Resources/Insurance Housing Leisure Time Resilience Social Support Talents/Skills Vocational/Educational  ADL's:  Intact  Cognition:  WNL  Sleep:        Treatment Plan Summary: 54 year old male with history of ascites who during this hospitalization presented some suicidal thoughts.  Upon evaluation, is depressed and does acknowledge some passive suicidal thoughts.  At this time patient is not deemed to be imminent danger to himself or others and is agreeable to start medication and continue  psychiatric care on an outpatient basis.  Diagnosis: MDD  Plan: We will start Lexapro 10 mg p.o. every morning first dose tomorrow morning.  Plan will be to discharge patient with a supply of these medications as he obtains outpatient appointment.  Disposition: No evidence of imminent risk to self or others at present.   Supportive therapy provided about ongoing stressors. Discussed crisis plan, support from social network, calling 911, coming to the Emergency Department, and calling Suicide Hotline.  Dixie Dials, MD 02/14/2019 5:24 PM

## 2019-02-14 NOTE — Progress Notes (Signed)
[facility] SURGICAL PROGRESS NOTE (cpt [phone])  Hospital Day(s): 2.   Interval History: Patient seen and examined, no acute events or new complaints overnight. Patient reports he is feeling a little better. No fever or chills. No leukocytosis. Renal function baseline. Pain and drainage at abdominal wound improved.   Review of Systems:  Constitutional: denies fever, chills  HEENT: denies cough or congestion  Respiratory: denies any shortness of breath  Cardiovascular: denies chest pain or palpitations  Gastrointestinal: denies abdominal pain, N/V, or diarrhea/and bowel function as per interval history Genitourinary: denies burning with urination or urinary frequency Musculoskeletal: + Abdominal Ulceration/Wound  Vital signs in last 24 hours: [min-max] current  Temp:  [97.7 F (36.5 C)-98.2 F (36.8 C)] 98 F (36.7 C) (01/27 0537) Pulse Rate:  [63-64] 63 (01/27 0537) Resp:  [18-19] 18 (01/27 0537) BP: (96-106)/(52-61) 96/52 (01/27 0537) SpO2:  [94 %-98 %] 94 % (01/27 0537)     Height: 5\' 10"  (177.8 cm) Weight: 81.6 kg BMI (Calculated): 25.83   Intake/Output last 2 shifts:  01/26 0701 - 01/27 0700 In: 870 [P.O.:720; IV Piggyback:150] Out: 1175 [Urine:1175]   Physical Exam:  Constitutional: alert, cooperative and no distress  HENT: normocephalic without obvious abnormality  Eyes: PERRL, EOM's grossly intact and symmetric   Respiratory: breathing non-labored at rest  Cardiovascular: regular rate and sinus rhythm  Gastrointestinal: soft, non-tender, and non-distended Integumentary: Large ulcerated area superior to the umbilicus, erythema appears to be further improved compared to yesterday, no appreciable drainage, no purulence or fluctuance.   Abdominal Wound (02/13/2018):      Labs:  CBC Latest Ref Rng & Units 02/14/2019 02/12/2019 10/11/2018  WBC 4.0 - 10.5 K/uL 8.4 9.7 9.1  Hemoglobin 13.0 - 17.0 g/dL 11.3(L) 13.5 11.4(L)  Hematocrit 39.0 - 52.0 % 32.7(L)  40.0 34.1(L)  Platelets 150 - 400 K/uL 155 163 211   CMP Latest Ref Rng & Units 02/14/2019 02/13/2019 02/12/2019  Glucose 70 - 99 mg/dL 02/14/2019) - 650(P)  BUN 6 - 20 mg/dL 546(F) - 68(L)  Creatinine 0.61 - 1.24 mg/dL 27(N) 1.70(Y) 1.74(B)  Sodium 135 - 145 mmol/L 136 - 134(L)  Potassium 3.5 - 5.1 mmol/L 4.6 - 4.5  Chloride 98 - 111 mmol/L 105 - 103  CO2 22 - 32 mmol/L 23 - 22  Calcium 8.9 - 10.3 mg/dL 9.7 - 4.49(Q  Total Protein 6.5 - 8.1 g/dL 7.0 - 7.9  Total Bilirubin 0.3 - 1.2 mg/dL 75.9) - 1.7(H)  Alkaline Phos 38 - 126 U/L 139(H) - 175(H)  AST 15 - 41 U/L 26 - 33  ALT 0 - 44 U/L 27 - 30     Imaging studies: No new pertinent imaging studies   Assessment/Plan: (ICD-10's:K27.4) 53 y.o.malewith IMPROVED ulcerative changes to the skin superior to the umbilicus which appears to be attributable to likely a very small ventral defect which has allowed ascitic fluid to leak into the subcutaneous space which has ruptured spontaneously, no clear evidence of abscess currently.              - Continue IV Abx (narrowed to Ceftriaxone)             - Continue local wound care; daily dressing changes PRN if saturating through; use Aquacel Ag; okay to irrigate with NS              - No surgical intervention, will continue to reassess whether or not there remains an undrained abscess. Would like to avoid very invasive surgical  intervention as this may result in creation of an external leak for ascitic fluid.  - pain control prn - paracentesis prn; appreciate IR help - further management per primary service; we will follow   All of the above findings and recommendations were discussed with the patient, and the medical team, and all of patient's questions were answered to his expressed satisfaction.  -- Edison Simon, PA-C [facility] 02/14/2019, 7:55 AM [phone] M-F: 7am - 4pm

## 2019-02-14 NOTE — Progress Notes (Signed)
PROGRESS NOTE    Timothy Johns  YPP:[id number] DOB: 07/07/1965 DOA: 02/12/2019 PCP: Maple Hudson., MD      Brief Narrative:  Timothy Johns is a 55 y.o. M with cirrhosis, recurrent ascites, and HTN who presented with worsening redness and ulceration and leaking of an abdominal skin ulcer with a masslike experience.  In the ER, patient was afebrile, normal white count.  CT showed an 11 x 2 x 6 cm rim-enhancing fluid collection subcutaneously under his ulcerated area on the mid abdomen and was started on IV antibiotics.  General surgery was consulted.       Assessment & Plan:  Ulcer with associated cellulitis Subcutaneous fluid collection, abscess not yet ruled out The patient skin ulcer appears to be related to skin breakdown from massive ascites (several times last year, the patient had to have paracentesis and reports that he had over 20 L at a time removed from his abdomen).  In the context of this ultra-large volume ascites and his prior ventral hernia, he started to develop a bulge in his abdomen, a persistent skin ulcer, which slowly over months became worse and worse until it was necrotic in the center, had surrounding redness and pain, had a mass-like collection subcutaneously and was draining fluid.  General surgery were consulted, and had great reservations about any violation of the skin, prefer to attempt conservative management at this time.  While there was initially some concern about a peritoneal defect with leaking ascites, there appears to be no more leaking of fluid, and so it is unclear if this was actual peritoneal leak/fistula or just weeping from cellulitis.  In last 24 hours, redness improved draqmatically, no drainage of fluid.  Pain improved.  Appears to be responding well to antibiotic therapy.  -Continue CTX for now, transition to cefdinir at discharge -Close outpatient follow up with Gen Surg -Consult general surgery, appreciate cares   Cirrhosis  with ascites Underwent paracentesis with ~2L fluid removed  MELD-Na 15, low mortality risk  -Continue furosemide and spironolactone  CKD stage IIIa Baseline creatinine 1.2-1.3, stable  Suicidal ideation Today the patient was in tears when I entered the room.  He endorsed numerous personal stressors at the moment, superimposed on the stress of his stable but severe organ failure and complicated medical issues.  He endorsed feelings of self harm, with a plan albeit a vague one, to "just drive away from here, park my car and start walking until I die".  I do not think the patient is an imminent risk to self requiring commitment for these statements alone, but I feel he warrants psychiatric evaluation for voluntary inpatient treatment, and contracting for safety with [facility]. -Consult Psychiatry       Disposition: The patient was admitted with suspected cellulitis of his abdominal wall.  This is an unusual condition, and the clinical course is somewhat unclear to me.  We will continue IV antibiotics, and due to his new suicidal ideation, I will consult psychiatry.  The patient continues to respond to IV antibiotics, and he is able to contract with psychiatry for safety, possibly home tomorrow versus voluntary commitment.        MDM: The below labs and imaging reports reviewed and summarized above.  Medication management as above.      DVT prophylaxis: SCDs Code Status: Full code Family Communication:     Consultants:   General surgery  Psychiatry  Procedures:   1/25 paracentesis  Antimicrobials:   Vancomycin and Zosyn x1  1/25  Ceftriaxone 1/26>>  Culture data:   1/25 blood culture x2 --no growth to date  1/25 body fluid culture --no growth to date          Subjective: Patient was some suicidal ideation today.  The redness and pain in his abdomen is improved, there is no redness or swelling around the wound today.  There is no leaking of fluid.   There is no confusion, decreased mentation, vomiting.  His ascites is resolved.  Objective: Vitals:   02/13/19 1400 02/13/19 2135 02/14/19 0537 02/14/19 1235  BP: 102/61 (!) 106/54 (!) 96/52 100/64  Pulse: 63 64 63 67  Resp: 19 18 18 14   Temp: 97.7 F (36.5 C) 98.2 F (36.8 C) 98 F (36.7 C) 98 F (36.7 C)  TempSrc: Oral Oral Oral Oral  SpO2: 98% 96% 94% 94%  Weight:      Height:        Intake/Output Summary (Last 24 hours) at 02/14/2019 1547 Last data filed at 02/14/2019 1324 Gross per 24 hour  Intake 240 ml  Output 1200 ml  Net -960 ml   Filed Weights   02/12/19 0514  Weight: 81.6 kg    Examination: General appearance: Thin, chronically ill appearing adult male, alert and in no acute medical distress, but tearful.   HEENT: Anicteric, conjunctiva pink, lids and lashes normal. No nasal deformity, discharge, epistaxis.  Lips moist, dentition in good repair, oropharynx moist, no oral lesions, hearing normal  Skin: Warm and dry.  Palmar erythema.  No suspicious rashes or lesions.  The previously described ulcer appears improved with less redness surrounding it, see picture in general surgery note Cardiac: RRR, no murmurs appreciated.  No LE edema.    Respiratory: Normal respiratory rate and rhythm.  CTAB without rales or wheezes. Abdomen: Abdomen soft.  No ascites, distension, hepatosplenomegaly.   MSK: No deformities or effusions of the large joints of the upper or lower extremities bilaterally. Neuro: Awake and alert. Naming is grossly intact, and the patient's recall, recent and remote, as well as general fund of knowledge seem within normal limits.  Muscle tone normal, without fasciculations.  Moves all extremities equally and with normal coordination.  Speech fluent.    Psych: Sensorium intact and responding to questions, attention normal. Affect tearful.  Judgment and insight appear normal.  Endorses suicidal ideation.     Data Reviewed: I have personally reviewed  following labs and imaging studies:  CBC: Recent Labs  Lab 02/12/19 0529 02/14/19 0440  WBC 9.7 8.4  NEUTROABS 6.4  --   HGB 13.5 11.3*  HCT 40.0 32.7*  MCV 95.2 94.2  PLT 163 155   Basic Metabolic Panel: Recent Labs  Lab 02/12/19 0529 02/13/19 0518 02/14/19 0440  NA 134*  --  136  K 4.5  --  4.6  CL 103  --  105  CO2 22  --  23  GLUCOSE 178*  --  107*  BUN 45*  --  44*  CREATININE 1.29* 1.35* 1.33*  CALCIUM 10.0  --  9.7   GFR: Estimated Creatinine Clearance: 66.3 mL/min (A) (by C-G formula based on SCr of 1.33 mg/dL (H)). Liver Function Tests: Recent Labs  Lab 02/12/19 0529 02/14/19 0440  AST 33 26  ALT 30 27  ALKPHOS 175* 139*  BILITOT 1.7* 1.4*  PROT 7.9 7.0  ALBUMIN 3.5 3.0*   Recent Labs  Lab 02/12/19 0529  LIPASE 18   Recent Labs  Lab 02/12/19 0710  AMMONIA 23  Coagulation Profile: Recent Labs  Lab 02/12/19 0529  INR 1.2   Cardiac Enzymes: No results for input(s): CKTOTAL, CKMB, CKMBINDEX, TROPONINI in the last 168 hours. BNP (last 3 results) No results for input(s): PROBNP in the last 8760 hours. HbA1C: Recent Labs    02/12/19 0529  HGBA1C 5.3   CBG: No results for input(s): GLUCAP in the last 168 hours. Lipid Profile: No results for input(s): CHOL, HDL, LDLCALC, TRIG, CHOLHDL, LDLDIRECT in the last 72 hours. Thyroid Function Tests: No results for input(s): TSH, T4TOTAL, FREET4, T3FREE, THYROIDAB in the last 72 hours. Anemia Panel: No results for input(s): VITAMINB12, FOLATE, FERRITIN, TIBC, IRON, RETICCTPCT in the last 72 hours. Urine analysis:    Component Value Date/Time   COLORURINE AMBER (A) 05/27/2015 0739   APPEARANCEUR HAZY (A) 05/27/2015 0739   APPEARANCEUR Clear 11/05/2013 0416   LABSPEC 1.033 (H) 05/27/2015 0739   LABSPEC 1.005 11/05/2013 0416   PHURINE 5.0 05/27/2015 0739   GLUCOSEU NEGATIVE 05/27/2015 0739   GLUCOSEU Negative 11/05/2013 0416   HGBUR NEGATIVE 05/27/2015 0739   BILIRUBINUR NEGATIVE 05/27/2015  0739   BILIRUBINUR 1+ 11/05/2013 0416   KETONESUR TRACE (A) 05/27/2015 0739   PROTEINUR 100 (A) 05/27/2015 0739   NITRITE NEGATIVE 05/27/2015 0739   LEUKOCYTESUR NEGATIVE 05/27/2015 0739   LEUKOCYTESUR Negative 11/05/2013 0416   Sepsis Labs: @LABRCNTIP (procalcitonin:4,lacticacidven:4)  ) Recent Results (from the past 240 hour(s))  Culture, blood (routine x 2)     Status: None (Preliminary result)   Collection Time: 02/12/19  7:10 AM   Specimen: BLOOD  Result Value Ref Range Status   Specimen Description BLOOD RIGHT [facility]  Final   Special Requests   Final    BOTTLES DRAWN AEROBIC AND ANAEROBIC Blood Culture adequate volume   Culture   Final    NO GROWTH 2 DAYS Performed at [facility], [address], [place], [place]    Report Status PENDING  Incomplete  Culture, blood (routine x 2)     Status: None (Preliminary result)   Collection Time: 02/12/19  7:16 AM   Specimen: BLOOD  Result Value Ref Range Status   Specimen Description BLOOD RIGHT WRIST  Final   Special Requests   Final    BOTTLES DRAWN AEROBIC AND ANAEROBIC Blood Culture adequate volume   Culture   Final    NO GROWTH 2 DAYS Performed at [facility], [address] [place], [place]    Report Status PENDING  Incomplete  Respiratory Panel by RT PCR (Flu A&B, Covid) - Nasopharyngeal Swab     Status: None   Collection Time: 02/12/19  8:13 AM   Specimen: Nasopharyngeal Swab  Result Value Ref Range Status   SARS Coronavirus 2 by RT PCR NEGATIVE NEGATIVE Final    Comment: (NOTE) SARS-CoV-2 target nucleic acids are NOT DETECTED. The SARS-CoV-2 RNA is generally detectable in upper respiratoy specimens during the acute phase of infection. The lowest concentration of SARS-CoV-2 viral copies this assay can detect is 131 copies/mL. A negative result does not preclude SARS-Cov-2 infection and should not be used as the sole basis for treatment or other patient management decisions. A  negative result may occur with  improper specimen collection/handling, submission of specimen other than nasopharyngeal swab, presence of viral mutation(s) within the areas targeted by this assay, and inadequate number of viral copies (<131 copies/mL). A negative result must be combined with clinical observations, patient history, and epidemiological information. The expected result is Negative. Fact Sheet for  Patients:  PinkCheek.be Fact Sheet for Healthcare Providers:  GravelBags.it This test is not yet ap proved or cleared by the [place] and  has been authorized for detection and/or diagnosis of SARS-CoV-2 by FDA under an Emergency Use Authorization (EUA). This EUA will remain  in effect (meaning this test can be used) for the duration of the COVID-19 declaration under Section 564(b)(1) of the Act, 21 U.S.C. section 360bbb-3(b)(1), unless the authorization is terminated or revoked sooner.    Influenza A by PCR NEGATIVE NEGATIVE Final   Influenza B by PCR NEGATIVE NEGATIVE Final    Comment: (NOTE) The Xpert Xpress SARS-CoV-2/FLU/RSV assay is intended as an aid in  the diagnosis of influenza from Nasopharyngeal swab specimens and  should not be used as a sole basis for treatment. Nasal washings and  aspirates are unacceptable for Xpert Xpress SARS-CoV-2/FLU/RSV  testing. Fact Sheet for Patients: PinkCheek.be Fact Sheet for Healthcare Providers: GravelBags.it This test is not yet approved or cleared by the [place] FDA and  has been authorized for detection and/or diagnosis of SARS-CoV-2 by  FDA under an Emergency Use Authorization (EUA). This EUA will remain  in effect (meaning this test can be used) for the duration of the  Covid-19 declaration under Section 564(b)(1) of the Act, 21  U.S.C. section 360bbb-3(b)(1), unless the authorization is    terminated or revoked. Performed at [facility], [address] [place], [place] [address]   Body fluid culture     Status: None (Preliminary result)   Collection Time: 02/12/19  9:30 AM   Specimen: PATH Cytology Peritoneal fluid  Result Value Ref Range Status   Specimen Description   Final    PERITONEAL Performed at [facility], [address] [place], [place] [address]    Special Requests   Final    NONE Performed at [facility], [address] [place], [place] [address]    Gram Stain   Final    FEW WBC PRESENT, PREDOMINANTLY MONONUCLEAR NO ORGANISMS SEEN    Culture   Final    NO GROWTH 2 DAYS Performed at [facility], [place] [address], [place], [place] [address]    Report Status PENDING  Incomplete         Radiology Studies: No results found.      Scheduled Meds: . B-complex with vitamin C  1 tablet Oral Daily  . furosemide  40 mg Oral Daily  . magnesium oxide  400 mg Oral Daily  . multivitamin with minerals  1 tablet Oral Daily  . nicotine  21 mg Transdermal Daily  . spironolactone  150 mg Oral Daily   Continuous Infusions: . sodium chloride 1,000 mL (02/14/19 1321)  . cefTRIAXone (ROCEPHIN)  IV 1 g (02/14/19 1323)     LOS: 2 days    Time spent: 25 minutes    Edwin Dada, MD [facility] 02/14/2019, 3:47 PM     Please page though [place] or Epic secure chat:  For [employer], Adult nurse

## 2019-02-15 ENCOUNTER — Telehealth: Payer: Self-pay

## 2019-02-15 DIAGNOSIS — F321 Major depressive disorder, single episode, moderate: Secondary | ICD-10-CM

## 2019-02-15 DIAGNOSIS — F1021 Alcohol dependence, in remission: Secondary | ICD-10-CM

## 2019-02-15 DIAGNOSIS — L0390 Cellulitis, unspecified: Secondary | ICD-10-CM

## 2019-02-15 DIAGNOSIS — F339 Major depressive disorder, recurrent, unspecified: Secondary | ICD-10-CM

## 2019-02-15 LAB — BODY FLUID CULTURE: Culture: NO GROWTH

## 2019-02-15 MED ORDER — SPIRONOLACTONE 50 MG PO TABS: 150.0000 mg | ORAL_TABLET | Freq: Every day | ORAL | 3 refills | Status: AC

## 2019-02-15 MED ORDER — CEFDINIR 300 MG PO CAPS: 300.0000 mg | ORAL_CAPSULE | Freq: Two times a day (BID) | ORAL | 0 refills | Status: DC

## 2019-02-15 MED ORDER — "AQUACEL AG FOAM 4""X4"" EX PADS": 1.0000 | MEDICATED_PAD | Freq: Every day | CUTANEOUS | 0 refills | Status: DC | PRN

## 2019-02-15 MED ORDER — ESCITALOPRAM OXALATE 10 MG PO TABS: 10.0000 mg | ORAL_TABLET | Freq: Every day | ORAL | 0 refills | Status: AC

## 2019-02-15 NOTE — Plan of Care (Signed)
Discharge order received. Patient mental status is at baseline. Vital signs stable . No signs of acute distress. Pt was educated on dressing changed. Pt has demonstrated the procedure correctly. Discharge instructions given. Patient verbalized understanding. No other issues noted at this time.

## 2019-02-15 NOTE — Telephone Encounter (Signed)
Please advise 

## 2019-02-15 NOTE — Discharge Instructions (Signed)
Wound Care:  Okay to shower at home, removed dressing, let soapy water run over incision, do not scrub, pat dry After you shower...Marland KitchenMarland KitchenIt is okay to cover with dry gauze, ABD pads, and secure with tape as needed I will prescribe Aquacel Ag dressing as needed, if you do not notice any drainage/output then it is kay to just cover with regular guaze.  Complete ABx as prescribed Follow up with Dr Claudine Mouton in 1-2 weeks for re-check

## 2019-02-15 NOTE — Discharge Summary (Signed)
Physician Discharge Summary  Timothy Johns JGG:[id number] DOB: 05-Apr-1965 DOA: 02/12/2019  PCP: Maple Hudson., MD  Admit date: 02/12/2019 Discharge date: 02/15/2019  Admitted From: Home  Disposition:  Home   Recommendations for Outpatient Follow-up:  1. Follow up with General Surgery Dr. Claudine Mouton in 1-2 weeks 2. Follow up with Psychaitry in 1 month      Home Health: None  Equipment/Devices: None  Discharge Condition: Fair  CODE STATUS: FULL Diet recommendation: Low sodium  Brief/Interim Summary: Timothy Johns is a 54 y.o. M with cirrhosis, recurrent ascites, and HTN who presented with worsening redness and ulceration and leaking of an abdominal skin ulcer with a masslike experience.  In the ER, patient was afebrile, normal white count.  CT showed an 11 x 2 x 6 cm rim-enhancing fluid collection subcutaneously under his ulcerated area on the mid abdomen and was started on IV antibiotics.  General surgery was consulted.      PRINCIPAL HOSPITAL DIAGNOSIS: Cellulitis of abdominal wall    Discharge Diagnoses:   Ulcer with associated cellulitis Subcutaneous fluid collection, abscess not yet ruled out Patient was admitted with ascites, and a large associated palm-sized skin ulcer on the abdomen with surrounding cellulitis and weeping versus peritoneal fluid drainage.    The patient was started on empiric antibiotics, and observed.  Paracentesis was obtained, 3 L were removed, and the ascites improved.  After removing fluid pressure from the wound, and treating with antibiotics, the ulcer appeared less red, swollen, and drainage stopped.  This skin ulcer appears to be related to skin breakdown from massive ascites (several times last year, the patient had to have paracentesis and reports that he had over 20 L at a time removed from his abdomen).  In the context of this ultra-large volume ascites and his prior ventral hernia, he started to develop a bulge in his abdomen,  a persistent skin ulcer, which slowly over months became worse and worse until it was necrotic in the center, had surrounding redness and pain.  General surgery were consulted at the time of admission, and follow along with the patient's treatment.  They had great reservations (shared by the patient) about any violation of the skin, and a conservative approach was taken.  While there was initially some concern about a peritoneal defect with leaking ascites, this improved rapidly with antibiotics, and I suspect this was simply weeping from cellulitis.  The patient's redness and swelling improved dramatically, and fluid drainage stopped, pain improved.  He was transitioned to oral cefdinir, and will complete 10 more days.  He has close general surgery follow-up.      Cirrhosis with ascites Underwent paracentesis with ~3L fluid removed  MELD-Na 15, low mortality risk Continue furosemide and spironolactone  CKD stage IIIa Baseline creatinine 1.2-1.3, stable  Suicidal ideation The patient endorsed some passive suicidal ideation.  He was evaluated by psychiatry, who felt he could contract for safety, and did not require inpatient psychiatric evaluation.  He was started on Lexapro, and the patient has already reached out to his PCP for psychiatric referral after discharge.  He denied suicidal ideation on the day of discharge.           Discharge Instructions  Discharge Instructions    Discharge instructions   Complete by: As directed    From Dr. Maryfrances Bunnell Follow up with Dr. Claudine Mouton from General Surgery in 10-14 days (See contact info below, if needed)  Take cefdinir 300 mg twice daily for the next 10  days  Take your diuretics as previously Lasix 40 daily and Spironolactone 150 daily (I have refilled the spironolactone, which, in our system, was listed at only 50 mg twice daily)  Take escitalopram/Lexapro 10 mg daily Follow up with behavioral health within 1 month and ask for  refills   Increase activity slowly   Complete by: As directed      Allergies as of 02/15/2019      Reactions   Tylenol [acetaminophen] Other (See Comments)   Pt. states PCP told him not to take because of his liver.      Medication List    STOP taking these medications   aspirin EC 325 MG tablet     TAKE these medications   Aquacel Ag Foam 4"X4" Pads Apply 1 Container topically daily as needed.   b complex vitamins tablet Take 1 tablet by mouth daily.   cefdinir 300 MG capsule Commonly known as: OMNICEF Take 1 capsule (300 mg total) by mouth 2 (two) times daily.   escitalopram 10 MG tablet Commonly known as: LEXAPRO Take 1 tablet (10 mg total) by mouth daily.   furosemide 40 MG tablet Commonly known as: LASIX Take 40 mg by mouth daily.   glucosamine-chondroitin 500-400 MG tablet Take 1 tablet by mouth 3 (three) times daily.   magnesium oxide 400 MG tablet Commonly known as: MAG-OX Take 400 mg by mouth daily.   multivitamin tablet Take 1 tablet by mouth daily.   spironolactone 50 MG tablet Commonly known as: ALDACTONE Take 3 tablets (150 mg total) by mouth daily.   thiamine 100 MG tablet Take 100 mg by mouth daily.   Vitamin D (Cholecalciferol) 10 MCG (400 UNIT) Tabs Take 1 tablet by mouth daily.      Follow-up Information    Ronny Bacon, MD. Go on 03/01/2019.   Specialty: Surgery Why: @10am    Follow up for ulcerated wound to abdomen Contact information: [address] [place] [address] [phone]          Allergies  Allergen Reactions  . Tylenol [Acetaminophen] Other (See Comments)    Pt. states PCP told him not to take because of his liver.    Consultations:  General surgery  Psychiatry   Procedures/Studies: CT Abdomen Pelvis W Contrast  Result Date: 02/12/2019 CLINICAL DATA:  Large umbilical hernia, known cirrhosis and ascites EXAM: CT ABDOMEN AND PELVIS WITH CONTRAST TECHNIQUE: Multidetector CT imaging of  the abdomen and pelvis was performed using the standard protocol following bolus administration of intravenous contrast. CONTRAST:  156mL OMNIPAQUE IOHEXOL 300 MG/ML  SOLN COMPARISON:  CT abdomen pelvis 01/08/2026 FINDINGS: Lower chest: Lung bases are clear. Normal heart size. No pericardial effusion. Hepatobiliary: Diffusely nodular hepatic surface contour. No focal hepatic lesions. Patient is post cholecystectomy. No biliary ductal dilatation or calcified intraductal gallstones. Pancreas: Mild pancreatic atrophy. No focal peripancreatic inflammation or ductal dilatation. Parenchymal calcification may reflect sequela of parenchyma tightest. Spleen: Mild splenomegaly. No focal splenic lesion. Adrenals/Urinary Tract: Adrenal glands are unremarkable. Kidneys are normal, without renal calculi, focal lesion, or hydronephrosis. Circumferential bladder wall thickening, nonspecific with anti dependently layering punctate radiodensity similar to comparison possibly reflecting bladder wall calcification. Stomach/Bowel: Edematous distal esophageal thickening. Mild antral thickening is present as well. No gross bowel dilatation or wall thickening. No evidence of small-bowel obstruction. Mild diffuse edematous thickening of the colon is noted. Portion of the transverse colon comes in close proximity to a small ventral hernia defect through which partially extends a rim enhancing  collection in the anterior soft tissues. Unclear if there is contiguity with the bowel itself. No herniation of bowel is seen. Distal colon has a more normal appearance. Vascular/Lymphatic: Atherosclerotic plaque within the normal caliber aorta. Prominent reactive mid mesenteric nodes are present. No pathologically enlarged abdominopelvic lymph nodes. Reproductive: The prostate and seminal vesicles are unremarkable. Other: At the site of reported herniation irregular patulous rim enhancing fluid collection measuring up to 11.5 x 2.4 by 6.9 cm in size  with some overlying skin thickening and ulceration. The posterior aspect of this collection is closely associated with a tiny ventral fascial through which this collection partially extends. Connection to the adjacent loop of transverse colon is not fully exclude no frank herniation of bowel is seen. Elsewhere there is diffuse mild body wall edema. There is a moderate to large volume ascites within the abdomen. No free abdominopelvic air is noted. Musculoskeletal: There is severe destructive changes of the right hip with articular surface collapse of the femoral head. While findings could reflect sequela of avascular necrosis a complex right hip effusion with features of synovitis is present and a septic arthritis cannot be fully excluded. There are multilevel degenerative changes in the spine. IMPRESSION: 1. At the site of reported ventral hernia herniation there is a 11.5 x 2.4 by 6.9 cm rim enhancing fluid collection with some overlying skin thickening and ulceration concerning for possible abscess. The posterior aspect of this collection is closely associated with a tiny ventral fascial through which this collection partially extends. Connection to the adjacent loop of transverse colon is not fully excluded. Notably, this collection does not appear to correspond to a herniated loop of bowel. 2. Cirrhosis and stigmata of portal venous hypertension with moderate to large volume ascites and splenomegaly. 3. Distal esophageal thickening gastric antral thickening and diffuse pancolonic thickening are all nonspecific findings in the setting of cirrhosis, possibly reflecting a combination of portal enteropathy and colopathy. 4. Circumferential bladder wall thickening, nonspecific intra correlate with urinalysis to exclude cystitis. Radiodensity along the anterior wall of the bladder is unchanged from prior and may reflect a bladder wall calcification. 5. Severe destructive changes of the right hip with articular  surface collapse of the femoral head. While findings could reflect sequela of avascular necrosis a complex right hip effusion with features of synovitis is present and a septic arthritis cannot be fully excluded. Fluid sampling is recommended. These results were called by telephone at the time of interpretation on 02/12/2019 at 6:37 am to provider JADE SUNG , who verbally acknowledged these results. Electronically Signed   By: Kreg ShropshirePrice  DeHay M.D.   On: 02/12/2019 06:33   US Paracentesis  Result Date: 02/12/2019 INDICATION: Patient with history of ETOH cirrhosis, recurrent ascites who presented to [facility] ED today with complaints of bleeding from hernia. IR has been asked to perform a therapeutic paracentesis. EXAM: ULTRASOUND GUIDED THERAPEUTIC PARACENTESIS MEDICATIONS: 17 mL!% lidocaine COMPLICATIONS: None immediate. PROCEDURE: Informed written consent was obtained from the patient after a discussion of the risks, benefits and alternatives to treatment. A timeout was performed prior to the initiation of the procedure. Initial ultrasound scanning demonstrates a moderate amount of ascites within the left lower abdominal quadrant. The left lower abdomen was prepped and draped in the usual sterile fashion. 1% lidocaine was used for local anesthesia. Following this, a 6 Fr Safe-T-Centesis catheter was introduced. An ultrasound image was saved for documentation purposes. The paracentesis was performed. The catheter was removed and a dressing was applied. The patient tolerated  the procedure well without immediate post procedural complication. FINDINGS: A total of approximately 2.9 L of clear yellow fluid was removed. IMPRESSION: Successful ultrasound-guided paracentesis yielding 2.9 liters of peritoneal fluid. Read by Lynnette Caffey, PA-C Electronically Signed   By: Corlis Leak M.D.   On: 02/12/2019 09:50   DG Chest Port 1 View  Result Date: 02/12/2019 CLINICAL DATA:  Abdominal hernia, bleeding EXAM: PORTABLE CHEST 1  VIEW COMPARISON:  Radiograph 01/09/2016 FINDINGS: Streaky opacities in the bases favor atelectasis. No consolidative opacity, convincing features of edema, pneumothorax or effusion. The cardiomediastinal contours are unremarkable. No acute osseous or soft tissue abnormality. Degenerative changes are present in the imaged spine and shoulders. IMPRESSION: Streaky opacities in the bases favor atelectasis. No other acute cardiopulmonary abnormality. Electronically Signed   By: Kreg Shropshire M.D.   On: 02/12/2019 05:35       Subjective: Feeling well, no new fever, redness surrounding the ulcer, weeping from the ulcer, abdominal pain.  His ascites is resolved.  Discharge Exam: Vitals:   02/14/19 2215 02/15/19 0542  BP: (!) 96/52 (!) 95/55  Pulse: 67 66  Resp: 18 18  Temp: 98.5 F (36.9 C) 97.7 F (36.5 C)  SpO2: 95% 95%   Vitals:   02/14/19 0537 02/14/19 1235 02/14/19 2215 02/15/19 0542  BP: (!) 96/52 100/64 (!) 96/52 (!) 95/55  Pulse: 63 67 67 66  Resp: 18 14 18 18   Temp: 98 F (36.7 C) 98 F (36.7 C) 98.5 F (36.9 C) 97.7 F (36.5 C)  TempSrc: Oral Oral Oral Oral  SpO2: 94% 94% 95% 95%  Weight:      Height:        General: Pt is alert, awake, not in acute distress Cardiovascular: RRR, nl S1-S2, no murmurs appreciated.   No LE edema.   Respiratory: Normal respiratory rate and rhythm.  CTAB without rales or wheezes. Abdominal: Abdomen soft no longer with ascites.  He also has no longer got any redness, and appears to be granulating well. Neuro/Psych: Strength symmetric in upper and lower extremities.  Judgment and insight appear normal.   The results of significant diagnostics from this hospitalization (including imaging, microbiology, ancillary and laboratory) are listed below for reference.     Microbiology: Recent Results (from the past 240 hour(s))  Culture, blood (routine x 2)     Status: None (Preliminary result)   Collection Time: 02/12/19  7:10 AM   Specimen: BLOOD   Result Value Ref Range Status   Specimen Description BLOOD RIGHT [facility]  Final   Special Requests   Final    BOTTLES DRAWN AEROBIC AND ANAEROBIC Blood Culture adequate volume   Culture   Final    NO GROWTH 3 DAYS Performed at [facility], [address] [place], [place] [address]    Report Status PENDING  Incomplete  Culture, blood (routine x 2)     Status: None (Preliminary result)   Collection Time: 02/12/19  7:16 AM   Specimen: BLOOD  Result Value Ref Range Status   Specimen Description BLOOD RIGHT WRIST  Final   Special Requests   Final    BOTTLES DRAWN AEROBIC AND ANAEROBIC Blood Culture adequate volume   Culture   Final    NO GROWTH 3 DAYS Performed at [facility], [address] [place], [place] [address]    Report Status PENDING  Incomplete  Respiratory Panel by RT PCR (Flu A&B, Covid) - Nasopharyngeal Swab     Status: None   Collection  Time: 02/12/19  8:13 AM   Specimen: Nasopharyngeal Swab  Result Value Ref Range Status   SARS Coronavirus 2 by RT PCR NEGATIVE NEGATIVE Final    Comment: (NOTE) SARS-CoV-2 target nucleic acids are NOT DETECTED. The SARS-CoV-2 RNA is generally detectable in upper respiratoy specimens during the acute phase of infection. The lowest concentration of SARS-CoV-2 viral copies this assay can detect is 131 copies/mL. A negative result does not preclude SARS-Cov-2 infection and should not be used as the sole basis for treatment or other patient management decisions. A negative result may occur with  improper specimen collection/handling, submission of specimen other than nasopharyngeal swab, presence of viral mutation(s) within the areas targeted by this assay, and inadequate number of viral copies (<131 copies/mL). A negative result must be combined with clinical observations, patient history, and epidemiological information. The expected result is Negative. Fact Sheet for Patients:   https://www.moore.com/ Fact Sheet for Healthcare Providers:  https://www.young.biz/ This test is not yet ap proved or cleared by the [place] FDA and  has been authorized for detection and/or diagnosis of SARS-CoV-2 by FDA under an Emergency Use Authorization (EUA). This EUA will remain  in effect (meaning this test can be used) for the duration of the COVID-19 declaration under Section 564(b)(1) of the Act, 21 U.S.C. section 360bbb-3(b)(1), unless the authorization is terminated or revoked sooner.    Influenza A by PCR NEGATIVE NEGATIVE Final   Influenza B by PCR NEGATIVE NEGATIVE Final    Comment: (NOTE) The Xpert Xpress SARS-CoV-2/FLU/RSV assay is intended as an aid in  the diagnosis of influenza from Nasopharyngeal swab specimens and  should not be used as a sole basis for treatment. Nasal washings and  aspirates are unacceptable for Xpert Xpress SARS-CoV-2/FLU/RSV  testing. Fact Sheet for Patients: https://www.moore.com/ Fact Sheet for Healthcare Providers: https://www.young.biz/ This test is not yet approved or cleared by the [place] FDA and  has been authorized for detection and/or diagnosis of SARS-CoV-2 by  FDA under an Emergency Use Authorization (EUA). This EUA will remain  in effect (meaning this test can be used) for the duration of the  Covid-19 declaration under Section 564(b)(1) of the Act, 21  U.S.C. section 360bbb-3(b)(1), unless the authorization is  terminated or revoked. Performed at [facility], [address] [place], [place] [address]   Body fluid culture     Status: None   Collection Time: 02/12/19  9:30 AM   Specimen: PATH Cytology Peritoneal fluid  Result Value Ref Range Status   Specimen Description   Final    PERITONEAL Performed at [facility], [address], [place], [place] [address]    Special Requests   Final    NONE Performed at  [facility], [address] [place], [place] [address]    Gram Stain   Final    FEW WBC PRESENT, PREDOMINANTLY MONONUCLEAR NO ORGANISMS SEEN    Culture   Final    NO GROWTH 3 DAYS Performed at [facility], [address] [place], [place] [address]    Report Status 02/15/2019 FINAL  Final     Labs: BNP (last 3 results) No results for input(s): BNP in the last 8760 hours. Basic Metabolic Panel: Recent Labs  Lab 02/12/19 0529 02/13/19 0518 02/14/19 0440  NA 134*  --  136  K 4.5  --  4.6  CL 103  --  105  CO2 22  --  23  GLUCOSE 178*  --  107*  BUN 45*  --  44*  CREATININE 1.29* 1.35* 1.33*  CALCIUM 10.0  --  9.7   Liver Function Tests: Recent Labs  Lab 02/12/19 0529 02/14/19 0440  AST 33 26  ALT 30 27  ALKPHOS 175* 139*  BILITOT 1.7* 1.4*  PROT 7.9 7.0  ALBUMIN 3.5 3.0*   Recent Labs  Lab 02/12/19 0529  LIPASE 18   Recent Labs  Lab 02/12/19 0710  AMMONIA 23   CBC: Recent Labs  Lab 02/12/19 0529 02/14/19 0440  WBC 9.7 8.4  NEUTROABS 6.4  --   HGB 13.5 11.3*  HCT 40.0 32.7*  MCV 95.2 94.2  PLT 163 155   Cardiac Enzymes: No results for input(s): CKTOTAL, CKMB, CKMBINDEX, TROPONINI in the last 168 hours. BNP: Invalid input(s): POCBNP CBG: No results for input(s): GLUCAP in the last 168 hours. D-Dimer No results for input(s): DDIMER in the last 72 hours. Hgb A1c No results for input(s): HGBA1C in the last 72 hours. Lipid Profile No results for input(s): CHOL, HDL, LDLCALC, TRIG, CHOLHDL, LDLDIRECT in the last 72 hours. Thyroid function studies No results for input(s): TSH, T4TOTAL, T3FREE, THYROIDAB in the last 72 hours.  Invalid input(s): FREET3 Anemia work up No results for input(s): VITAMINB12, FOLATE, FERRITIN, TIBC, IRON, RETICCTPCT in the last 72 hours. Urinalysis    Component Value Date/Time   COLORURINE AMBER (A) 05/27/2015 0739   APPEARANCEUR HAZY (A) 05/27/2015 0739   APPEARANCEUR Clear 11/05/2013 0416    LABSPEC 1.033 (H) 05/27/2015 0739   LABSPEC 1.005 11/05/2013 0416   PHURINE 5.0 05/27/2015 0739   GLUCOSEU NEGATIVE 05/27/2015 0739   GLUCOSEU Negative 11/05/2013 0416   HGBUR NEGATIVE 05/27/2015 0739   BILIRUBINUR NEGATIVE 05/27/2015 0739   BILIRUBINUR 1+ 11/05/2013 0416   KETONESUR TRACE (A) 05/27/2015 0739   PROTEINUR 100 (A) 05/27/2015 0739   NITRITE NEGATIVE 05/27/2015 0739   LEUKOCYTESUR NEGATIVE 05/27/2015 0739   LEUKOCYTESUR Negative 11/05/2013 0416   Sepsis Labs Invalid input(s): PROCALCITONIN,  WBC,  LACTICIDVEN Microbiology Recent Results (from the past 240 hour(s))  Culture, blood (routine x 2)     Status: None (Preliminary result)   Collection Time: 02/12/19  7:10 AM   Specimen: BLOOD  Result Value Ref Range Status   Specimen Description BLOOD RIGHT [facility]  Final   Special Requests   Final    BOTTLES DRAWN AEROBIC AND ANAEROBIC Blood Culture adequate volume   Culture   Final    NO GROWTH 3 DAYS Performed at [facility], [address] [place], [place] [address]    Report Status PENDING  Incomplete  Culture, blood (routine x 2)     Status: None (Preliminary result)   Collection Time: 02/12/19  7:16 AM   Specimen: BLOOD  Result Value Ref Range Status   Specimen Description BLOOD RIGHT WRIST  Final   Special Requests   Final    BOTTLES DRAWN AEROBIC AND ANAEROBIC Blood Culture adequate volume   Culture   Final    NO GROWTH 3 DAYS Performed at [facility], [address], [place], [place] [address]    Report Status PENDING  Incomplete  Respiratory Panel by RT PCR (Flu A&B, Covid) - Nasopharyngeal Swab     Status: None   Collection Time: 02/12/19  8:13 AM   Specimen: Nasopharyngeal Swab  Result Value Ref Range Status   SARS Coronavirus 2 by RT PCR NEGATIVE NEGATIVE Final    Comment: (NOTE) SARS-CoV-2 target nucleic acids are NOT DETECTED. The SARS-CoV-2  RNA is generally detectable in upper respiratoy specimens during the acute phase of  infection. The lowest concentration of SARS-CoV-2 viral copies this assay can detect is 131 copies/mL. A negative result does not preclude SARS-Cov-2 infection and should not be used as the sole basis for treatment or other patient management decisions. A negative result may occur with  improper specimen collection/handling, submission of specimen other than nasopharyngeal swab, presence of viral mutation(s) within the areas targeted by this assay, and inadequate number of viral copies (<131 copies/mL). A negative result must be combined with clinical observations, patient history, and epidemiological information. The expected result is Negative. Fact Sheet for Patients:  https://www.moore.com/https://www.fda.gov/media/142436/download Fact Sheet for Healthcare Providers:  https://www.young.biz/https://www.fda.gov/media/142435/download This test is not yet ap proved or cleared by the [place] FDA and  has been authorized for detection and/or diagnosis of SARS-CoV-2 by FDA under an Emergency Use Authorization (EUA). This EUA will remain  in effect (meaning this test can be used) for the duration of the COVID-19 declaration under Section 564(b)(1) of the Act, 21 U.S.C. section 360bbb-3(b)(1), unless the authorization is terminated or revoked sooner.    Influenza A by PCR NEGATIVE NEGATIVE Final   Influenza B by PCR NEGATIVE NEGATIVE Final    Comment: (NOTE) The Xpert Xpress SARS-CoV-2/FLU/RSV assay is intended as an aid in  the diagnosis of influenza from Nasopharyngeal swab specimens and  should not be used as a sole basis for treatment. Nasal washings and  aspirates are unacceptable for Xpert Xpress SARS-CoV-2/FLU/RSV  testing. Fact Sheet for Patients: https://www.moore.com/https://www.fda.gov/media/142436/download Fact Sheet for Healthcare Providers: https://www.young.biz/https://www.fda.gov/media/142435/download This test is not yet approved or cleared by the [place] FDA and  has been authorized for detection and/or diagnosis of SARS-CoV-2 by  FDA under  an Emergency Use Authorization (EUA). This EUA will remain  in effect (meaning this test can be used) for the duration of the  Covid-19 declaration under Section 564(b)(1) of the Act, 21  U.S.C. section 360bbb-3(b)(1), unless the authorization is  terminated or revoked. Performed at [facility], [address] [place], [place] [address]   Body fluid culture     Status: None   Collection Time: 02/12/19  9:30 AM   Specimen: PATH Cytology Peritoneal fluid  Result Value Ref Range Status   Specimen Description   Final    PERITONEAL Performed at [facility], [address] [place], [place] [address]    Special Requests   Final    NONE Performed at [facility] [facility], [address] [place], [place] [address]    Gram Stain   Final    FEW WBC PRESENT, PREDOMINANTLY MONONUCLEAR NO ORGANISMS SEEN    Culture   Final    NO GROWTH 3 DAYS Performed at [facility], [address] [place], [place] [address]    Report Status 02/15/2019 FINAL  Final     Time coordinating discharge: 25 minutes The [place] controlled substances registry was reviewed for this patient     SIGNED:   Alberteen Samhristopher P Shanitha Twining, MD  [facility] 02/15/2019, 3:30 PM

## 2019-02-15 NOTE — Telephone Encounter (Signed)
Copied from CRM [id number]. Topic: General - Other >> Feb 14, 2019  5:08 PM Marylen Ponto wrote: Reason for CRM: Pt stated he really needs to speak with Dr. Sullivan Lone as he is currently in the hospital and would like to discuss getting a referral to a psychiatrist when he is released. Pt requests call back. Cb# [phone]

## 2019-02-17 LAB — CULTURE, BLOOD (ROUTINE X 2)
Culture: NO GROWTH
Culture: NO GROWTH
Special Requests: ADEQUATE
Special Requests: ADEQUATE

## 2019-02-20 NOTE — Telephone Encounter (Signed)
Patient called checking status.  Patient states he had a set back last week and was in hospital.  Patient would like referral ASAP. Call back [phone]

## 2019-02-20 NOTE — Telephone Encounter (Signed)
Please advise 

## 2019-02-21 NOTE — Telephone Encounter (Signed)
Psychiatry at [facility] is best bet with no insurance. Would that not be right.?

## 2019-02-22 NOTE — Telephone Encounter (Signed)
Yes you can do referral to [facility]

## 2019-02-23 NOTE — Telephone Encounter (Signed)
Would you mind putting in referral for him for depression and alcoholism in remission.

## 2019-02-23 NOTE — Addendum Note (Signed)
Addended by: Janey Greaser D on: 02/23/2019 10:23 AM   Modules accepted: Orders

## 2019-03-01 ENCOUNTER — Ambulatory Visit: Payer: Self-pay | Admitting: Surgery

## 2019-03-01 ENCOUNTER — Other Ambulatory Visit: Payer: Self-pay

## 2019-03-01 ENCOUNTER — Encounter: Payer: Self-pay | Admitting: Surgery

## 2019-03-01 VITALS — BP 112/69 | HR 78 | Temp 97.7°F | Resp 12 | Ht 70.0 in | Wt 169.8 lb

## 2019-03-01 DIAGNOSIS — T148XXA Other injury of unspecified body region, initial encounter: Secondary | ICD-10-CM | POA: Insufficient documentation

## 2019-03-01 NOTE — Patient Instructions (Signed)
Begin to wash the area with water to get the gauze removed. When washing the area, you may start using Neosporin on your wound so the gauze doesn't get stuck. You may stop placing gauze over the area if need to.   Please see you follow up appointment below.  Wound Care, Adult Taking care of your wound properly can help to prevent pain, infection, and scarring. It can also help your wound to heal more quickly. How to care for your wound Wound care      Follow instructions from your health care provider about how to take care of your wound. Make sure you: ? Wash your hands with soap and water before you change the bandage (dressing). If soap and water are not available, use hand sanitizer. ? Change your dressing as told by your health care provider. ? Leave stitches (sutures), skin glue, or adhesive strips in place. These skin closures may need to stay in place for 2 weeks or longer. If adhesive strip edges start to loosen and curl up, you may trim the loose edges. Do not remove adhesive strips completely unless your health care provider tells you to do that.  Check your wound area every day for signs of infection. Check for: ? Redness, swelling, or pain. ? Fluid or blood. ? Warmth. ? Pus or a bad smell.  Ask your health care provider if you should clean the wound with mild soap and water. Doing this may include: ? Using a clean towel to pat the wound dry after cleaning it. Do not rub or scrub the wound. ? Applying a cream or ointment. Do this only as told by your health care provider. ? Covering the incision with a clean dressing.  Ask your health care provider when you can leave the wound uncovered.  Keep the dressing dry until your health care provider says it can be removed. Do not take baths, swim, use a hot tub, or do anything that would put the wound underwater until your health care provider approves. Ask your health care provider if you can take showers. You may only be allowed  to take sponge baths. Medicines   If you were prescribed an antibiotic medicine, cream, or ointment, take or use the antibiotic as told by your health care provider. Do not stop taking or using the antibiotic even if your condition improves.  Take over-the-counter and prescription medicines only as told by your health care provider. If you were prescribed pain medicine, take it 30 or more minutes before you do any wound care or as told by your health care provider. General instructions  Return to your normal activities as told by your health care provider. Ask your health care provider what activities are safe.  Do not scratch or pick at the wound.  Do not use any products that contain nicotine or tobacco, such as cigarettes and e-cigarettes. These may delay wound healing. If you need help quitting, ask your health care provider.  Keep all follow-up visits as told by your health care provider. This is important.  Eat a diet that includes protein, vitamin A, vitamin C, and other nutrient-rich foods to help the wound heal. ? Foods rich in protein include meat, dairy, beans, nuts, and other sources. ? Foods rich in vitamin A include carrots and dark green, leafy vegetables. ? Foods rich in vitamin C include citrus, tomatoes, and other fruits and vegetables. ? Nutrient-rich foods have protein, carbohydrates, fat, vitamins, or minerals. Eat a variety of healthy  foods including vegetables, fruits, and whole grains. Contact a health care provider if:  You received a tetanus shot and you have swelling, severe pain, redness, or bleeding at the injection site.  Your pain is not controlled with medicine.  You have redness, swelling, or pain around the wound.  You have fluid or blood coming from the wound.  Your wound feels warm to the touch.  You have pus or a bad smell coming from the wound.  You have a fever or chills.  You are nauseous or you vomit.  You are dizzy. Get help right  away if:  You have a red streak going away from your wound.  The edges of the wound open up and separate.  Your wound is bleeding, and the bleeding does not stop with gentle pressure.  You have a rash.  You faint.  You have trouble breathing. Summary  Always wash your hands with soap and water before changing your bandage (dressing).  To help with healing, eat foods that are rich in protein, vitamin A, vitamin C, and other nutrients.  Check your wound every day for signs of infection. Contact your health care provider if you suspect that your wound is infected. This information is not intended to replace advice given to you by your health care provider. Make sure you discuss any questions you have with your health care provider. Document Revised: 04/24/2018 Document Reviewed: 07/22/2015 Elsevier Patient Education  2020 [employer].

## 2019-03-01 NOTE — Progress Notes (Signed)
Surgical Clinic Progress/Follow-up Note   HPI:  54 y.o. Male presents to clinic for wound care follow-up, first office visit for follow up the last evaluation was prior to his discharge. Patient reports  improvement/resolution of prior issues and has been tolerating regular diet with +flatus and normal BM's, denies N/V, fever/chills, CP, or SOB.  He denies having any paracentesis since his discharge.  He reports his ascites is under good control.  He reports that every other day he is attempting to remove the Aquacel silver and admits to having some traumatization with change, replaces a new Aquacel quickly and does not really appreciate how his wound looks.  Review of Systems:  Constitutional: denies fever/chills  ENT: denies sore throat, hearing problems  Respiratory: denies shortness of breath, wheezing  Cardiovascular: denies chest pain, palpitations  Gastrointestinal: denies abdominal pain, N/V, or diarrhea/and bowel function as per interval history Skin: Denies any other rashes or skin discolorations except known umbilical skin wounds as per interval history  Vital Signs:  BP 112/69   Pulse 78   Temp 97.7 F (36.5 C)   Resp 12   Ht 5\' 10"  (1.778 m)   Wt 169 lb 12.8 oz (77 kg)   SpO2 98%   BMI 24.36 kg/m    Physical Exam:  Constitutional:  -- Normal body habitus  -- Awake, alert, and oriented x3  Pulmonary:  -- No crackles -- Equal breath sounds bilaterally -- Breathing non-labored at rest Cardiovascular:  -- S1, S2 present  -- No pericardial rubs  Gastrointestinal:  -- Soft and non-distended, non tenderness to palpation, no guarding/rebound tenderness.  There is laxity to his abdominal wall with ascites present, but with no tension whatsoever. --Continues to have a laxity of the skin of concern, the Aquacel is quite adherent to the region, without evidence of accumulated drainage present.  Nontender.  Removing the Aquacel will take a degree of time and I believe he would  prefer to do this at home.  We have moistened the Aquacel with normal saline solution.  Musculoskeletal / Integumentary:  -- Extremities: B/L UE and LE FROM, hands and feet warm, no edema   Laboratory studies: None new.  Imaging: No new pertinent imaging available for review   Assessment:  54 y.o. yo Male with a problem list including...  Patient Active Problem List   Diagnosis Date Noted  . Current moderate episode of major depressive disorder without prior episode ([place])   . Bleeding ulcer 02/12/2019  . Abdominal pain   . Abscess   . Acute on chronic cholecystitis 05/31/2015  . Cholelithiasis 05/31/2015  . Leukocytosis 05/31/2015  . Hyperbilirubinemia 05/31/2015  . Dehydration 05/31/2015  . Hypomagnesemia 05/31/2015  . Tobacco abuse counseling 05/31/2015  . Acute pancreatitis 05/27/2015  . History of syncope 05/13/2014  . Tobacco use 05/13/2014  . Ascites due to alcoholic cirrhosis ([place]) 01/75/1025  . Alcoholism ([place]) 05/13/2014  . Obesity 05/13/2014  . Insomnia 05/13/2014    presents to clinic for follow-up evaluation of skin ulcer following excessively tense ascites which resulted in a subcutaneous pocket of ascitic fluid, now resolved, progressing well.  Plan:              I believe we stop traumatizing his skin with the Aquacel changes, will continue to see epidermal overgrowth of the redundant skin.  We discussed utilization of Neosporin or triple antibiotic ointment.  We also discussed atraumatic methods of continuing the Aquacel Ag.  Other recommendations would be to utilize a hydrogel  with a nonstick dressing.  I believe the patient understands the goal is to avoid peeling dressings off.  He understands and yet desires to have this wound covered with a pad.  Apparently he has some limitations to access to showering or tub bathing.  So he is doing what he can for his hygiene.  Of asked him to follow-up within the next 3 to 4 weeks or as needed.  All of the above  recommendations were discussed with the patient and patient's family, and all of patient's and family's questions were answered to his/her/their expressed satisfaction.  Campbell Lerner, MD, FACS [place]: [facility] General Surgery - Partnering for exceptional care. Office: [phone]

## 2019-03-22 ENCOUNTER — Encounter: Payer: Self-pay | Admitting: Surgery

## 2019-03-22 ENCOUNTER — Other Ambulatory Visit: Payer: Self-pay

## 2019-03-22 ENCOUNTER — Ambulatory Visit: Payer: Self-pay | Admitting: Surgery

## 2019-03-22 VITALS — BP 129/72 | HR 85 | Temp 96.2°F | Ht 71.0 in | Wt 169.2 lb

## 2019-03-22 DIAGNOSIS — T148XXA Other injury of unspecified body region, initial encounter: Secondary | ICD-10-CM

## 2019-03-22 NOTE — Patient Instructions (Addendum)
Dr.Rodenberg advised patient he no longer has to use the pads to cover the wound. Dr.Rodenberg provided patient with an Abdominal Binder at today's visit. Patient is to give our office a call if he has any questions or concerns. Follow-up with our office as needed. Please call and ask to speak with a nurse if you develop questions or concerns.  Wound Care, Adult Taking care of your wound properly can help to prevent pain, infection, and scarring. It can also help your wound to heal more quickly. How to care for your wound Wound care      Follow instructions from your health care provider about how to take care of your wound. Make sure you: ? Wash your hands with soap and water before you change the bandage (dressing). If soap and water are not available, use hand sanitizer. ? Change your dressing as told by your health care provider. ? Leave stitches (sutures), skin glue, or adhesive strips in place. These skin closures may need to stay in place for 2 weeks or longer. If adhesive strip edges start to loosen and curl up, you may trim the loose edges. Do not remove adhesive strips completely unless your health care provider tells you to do that.  Check your wound area every day for signs of infection. Check for: ? Redness, swelling, or pain. ? Fluid or blood. ? Warmth. ? Pus or a bad smell.  Ask your health care provider if you should clean the wound with mild soap and water. Doing this may include: ? Using a clean towel to pat the wound dry after cleaning it. Do not rub or scrub the wound. ? Applying a cream or ointment. Do this only as told by your health care provider. ? Covering the incision with a clean dressing.  Ask your health care provider when you can leave the wound uncovered.  Keep the dressing dry until your health care provider says it can be removed. Do not take baths, swim, use a hot tub, or do anything that would put the wound underwater until your health care provider  approves. Ask your health care provider if you can take showers. You may only be allowed to take sponge baths. Medicines   If you were prescribed an antibiotic medicine, cream, or ointment, take or use the antibiotic as told by your health care provider. Do not stop taking or using the antibiotic even if your condition improves.  Take over-the-counter and prescription medicines only as told by your health care provider. If you were prescribed pain medicine, take it 30 or more minutes before you do any wound care or as told by your health care provider. General instructions  Return to your normal activities as told by your health care provider. Ask your health care provider what activities are safe.  Do not scratch or pick at the wound.  Do not use any products that contain nicotine or tobacco, such as cigarettes and e-cigarettes. These may delay wound healing. If you need help quitting, ask your health care provider.  Keep all follow-up visits as told by your health care provider. This is important.  Eat a diet that includes protein, vitamin A, vitamin C, and other nutrient-rich foods to help the wound heal. ? Foods rich in protein include meat, dairy, beans, nuts, and other sources. ? Foods rich in vitamin A include carrots and dark green, leafy vegetables. ? Foods rich in vitamin C include citrus, tomatoes, and other fruits and vegetables. ? Nutrient-rich foods have  protein, carbohydrates, fat, vitamins, or minerals. Eat a variety of healthy foods including vegetables, fruits, and whole grains. Contact a health care provider if:  You received a tetanus shot and you have swelling, severe pain, redness, or bleeding at the injection site.  Your pain is not controlled with medicine.  You have redness, swelling, or pain around the wound.  You have fluid or blood coming from the wound.  Your wound feels warm to the touch.  You have pus or a bad smell coming from the wound.  You have a  fever or chills.  You are nauseous or you vomit.  You are dizzy. Get help right away if:  You have a red streak going away from your wound.  The edges of the wound open up and separate.  Your wound is bleeding, and the bleeding does not stop with gentle pressure.  You have a rash.  You faint.  You have trouble breathing. Summary  Always wash your hands with soap and water before changing your bandage (dressing).  To help with healing, eat foods that are rich in protein, vitamin A, vitamin C, and other nutrients.  Check your wound every day for signs of infection. Contact your health care provider if you suspect that your wound is infected. This information is not intended to replace advice given to you by your health care provider. Make sure you discuss any questions you have with your health care provider. Document Revised: 04/24/2018 Document Reviewed: 07/22/2015 Elsevier Patient Education  [place].

## 2019-03-22 NOTE — Progress Notes (Signed)
Surgical Clinic Progress/Follow-up Note   HPI:  54 y.o. Male presents to clinic for follow-up of severe skin ulceration of anterior abdominal wall skin following remarkable skin tension secondary to uncontrolled ascites.  He reports he has not had an additional paracentesis since his hospitalization.  He does have recurrence of his external fascial fluid collection.  He still does not have evidence of hernia.  He denies fevers and chills.  He reports he is quite pleased with his healing progress and is no longer applying ointments or dressings aside from the ABD pad to support his skin. Review of Systems:  Constitutional: denies fever/chills  ENT: denies sore throat, hearing problems  Respiratory: denies shortness of breath, wheezing  Cardiovascular: denies chest pain, palpitations  Gastrointestinal: denies abdominal pain, N/V, or diarrhea/and bowel function. Skin: Denies any other rashes or skin discolorations.  Vital Signs:  BP 129/72   Pulse 85   Temp (!) 96.2 F (35.7 C) (Temporal)   Ht 5\' 11"  (1.803 m)   Wt 169 lb 3.2 oz (76.7 kg)   SpO2 98%   BMI 23.60 kg/m    Physical Exam:  Constitutional:  -- Normal body habitus  -- Awake, alert, and oriented x3  Pulmonary:  -- No crackles -- Equal breath sounds bilaterally -- Breathing non-labored at rest Cardiovascular:  -- S1, S2 present  -- No pericardial rubs  Gastrointestinal:  -- Soft and non-distended, non-tender/with no tenderness to palpation, no guarding/rebound tenderness --The redundant skin on his abdomen has healed completely.  There is no residual nonepidermal lysed areas that remain.  The redundant skin is significant with a hernia sac that is full of fluid which is easily compressible and reducible with pressure.  There is clearly no bowel.  He has no evidence of hernia on review of his CT imaging.   Musculoskeletal / Integumentary:  -- Wounds or skin discoloration: None appreciated  -- Extremities: B/L UE and LE  FROM, hands and feet warm, trace edema   Laboratory studies: None new  Imaging: No new pertinent imaging available for review   Assessment:  54 y.o. yo Male with a problem list including...  Patient Active Problem List   Diagnosis Date Noted  . Nonhealing nonsurgical wound limited to breakdown of skin 03/01/2019  . Current moderate episode of major depressive disorder without prior episode ([place])   . Bleeding ulcer 02/12/2019  . Abdominal pain   . Abscess   . Acute on chronic cholecystitis 05/31/2015  . Cholelithiasis 05/31/2015  . Leukocytosis 05/31/2015  . Hyperbilirubinemia 05/31/2015  . Dehydration 05/31/2015  . Hypomagnesemia 05/31/2015  . Tobacco abuse counseling 05/31/2015  . Acute pancreatitis 05/27/2015  . History of syncope 05/13/2014  . Tobacco use 05/13/2014  . Ascites due to alcoholic cirrhosis ([place]) 62/37/6283  . Alcoholism ([place]) 05/13/2014  . Obesity 05/13/2014  . Insomnia 05/13/2014    presents to clinic for follow-up evaluation of abdominal wall skin, progressing well.  Plan:              - return to clinic as needed, instructed to call office if any questions or concerns We applied an abdominal binder to support the skin at risk.  He is very comfortable not doing anything definitive about this hernia sac that is present, understanding his risks of intervention along with ascitic leak.  He also does not want to do anything that might jeopardize his opportunity for transplant.  And I cannot blame him.  His skin is currently intact, and hopefully we can  maintain it so. His abdominal ascites is currently under control but it has a pressure relief mechanism did which drains to the subcutaneous space and he is aware that this was the issue that led to the wound which is now healed.  We will be glad to see him and assist him in any way we can in the future. All of the above recommendations were discussed with the patient, and all of patient's questions were answered to  his expressed satisfaction.  Campbell Lerner, MD, FACS [place]: [facility] General [facility] - Partnering for exceptional care. Office: [phone]

## 2019-04-22 ENCOUNTER — Inpatient Hospital Stay: Payer: Self-pay | Admitting: Anesthesiology

## 2019-04-22 ENCOUNTER — Inpatient Hospital Stay
Admission: EM | Admit: 2019-04-22 | Discharge: 2019-04-27 | DRG: 329 | Disposition: A | Discharge status: Home / Self Care | Payer: Self-pay | Attending: General Surgery | Admitting: General Surgery

## 2019-04-22 ENCOUNTER — Other Ambulatory Visit: Payer: Self-pay

## 2019-04-22 ENCOUNTER — Encounter
Admission: EM | Disposition: A | Discharge status: Home / Self Care | Payer: Self-pay | Source: Home / Self Care | Attending: General Surgery

## 2019-04-22 ENCOUNTER — Inpatient Hospital Stay: Admit: 2019-04-22 | Payer: Self-pay | Admitting: General Surgery

## 2019-04-22 ENCOUNTER — Encounter: Payer: Self-pay | Admitting: Emergency Medicine

## 2019-04-22 ENCOUNTER — Emergency Department: Payer: Self-pay

## 2019-04-22 DIAGNOSIS — Z8249 Family history of ischemic heart disease and other diseases of the circulatory system: Secondary | ICD-10-CM

## 2019-04-22 DIAGNOSIS — K859 Acute pancreatitis without necrosis or infection, unspecified: Secondary | ICD-10-CM | POA: Diagnosis present

## 2019-04-22 DIAGNOSIS — R031 Nonspecific low blood-pressure reading: Secondary | ICD-10-CM | POA: Diagnosis not present

## 2019-04-22 DIAGNOSIS — K7460 Unspecified cirrhosis of liver: Secondary | ICD-10-CM | POA: Diagnosis present

## 2019-04-22 DIAGNOSIS — K766 Portal hypertension: Secondary | ICD-10-CM | POA: Diagnosis present

## 2019-04-22 DIAGNOSIS — K56609 Unspecified intestinal obstruction, unspecified as to partial versus complete obstruction: Secondary | ICD-10-CM

## 2019-04-22 DIAGNOSIS — R188 Other ascites: Secondary | ICD-10-CM | POA: Diagnosis present

## 2019-04-22 DIAGNOSIS — Z79899 Other long term (current) drug therapy: Secondary | ICD-10-CM

## 2019-04-22 DIAGNOSIS — Z87891 Personal history of nicotine dependence: Secondary | ICD-10-CM

## 2019-04-22 DIAGNOSIS — K430 Incisional hernia with obstruction, without gangrene: Principal | ICD-10-CM | POA: Diagnosis present

## 2019-04-22 DIAGNOSIS — R161 Splenomegaly, not elsewhere classified: Secondary | ICD-10-CM | POA: Diagnosis present

## 2019-04-22 DIAGNOSIS — Z20822 Contact with and (suspected) exposure to covid-19: Secondary | ICD-10-CM | POA: Diagnosis present

## 2019-04-22 DIAGNOSIS — K436 Other and unspecified ventral hernia with obstruction, without gangrene: Secondary | ICD-10-CM | POA: Diagnosis present

## 2019-04-22 DIAGNOSIS — I10 Essential (primary) hypertension: Secondary | ICD-10-CM | POA: Diagnosis present

## 2019-04-22 HISTORY — PX: INCISIONAL HERNIA REPAIR: SHX193

## 2019-04-22 LAB — COMPREHENSIVE METABOLIC PANEL
ALT: 43 U/L (ref 0–44)
AST: 37 U/L (ref 15–41)
Albumin: 3.7 g/dL (ref 3.5–5.0)
Alkaline Phosphatase: 132 U/L — ABNORMAL HIGH (ref 38–126)
Anion gap: 13 (ref 5–15)
BUN: 45 mg/dL — ABNORMAL HIGH (ref 6–20)
CO2: 19 mmol/L — ABNORMAL LOW (ref 22–32)
Calcium: 9.8 mg/dL (ref 8.9–10.3)
Chloride: 104 mmol/L (ref 98–111)
Creatinine, Ser: 1.33 mg/dL — ABNORMAL HIGH (ref 0.61–1.24)
GFR calc Af Amer: >60 mL/min (ref >60)
GFR calc non Af Amer: >60 mL/min (ref >60)
Glucose, Bld: 160 mg/dL — ABNORMAL HIGH (ref 70–99)
Potassium: 4.3 mmol/L (ref 3.5–5.1)
Sodium: 136 mmol/L (ref 135–145)
Total Bilirubin: 1.4 mg/dL — ABNORMAL HIGH (ref 0.3–1.2)
Total Protein: 7.5 g/dL (ref 6.5–8.1)

## 2019-04-22 LAB — LIPASE, BLOOD: Lipase: 15 U/L (ref 11–51)

## 2019-04-22 LAB — CBC
HCT: 34.2 % — ABNORMAL LOW (ref 39.0–52.0)
Hemoglobin: 11.8 g/dL — ABNORMAL LOW (ref 13.0–17.0)
MCH: 33.1 pg (ref 26.0–34.0)
MCHC: 34.5 g/dL (ref 30.0–36.0)
MCV: 95.8 fL (ref 80.0–100.0)
Platelets: 126 10*3/uL — ABNORMAL LOW (ref 150–400)
RBC: 3.57 MIL/uL — ABNORMAL LOW (ref 4.22–5.81)
RDW: 13.2 % (ref 11.5–15.5)
WBC: 11.9 10*3/uL — ABNORMAL HIGH (ref 4.0–10.5)
nRBC: 0 % (ref 0.0–0.2)

## 2019-04-22 LAB — URINALYSIS, COMPLETE (UACMP) WITH MICROSCOPIC
Bacteria, UA: NONE SEEN
Bilirubin Urine: NEGATIVE
Glucose, UA: NEGATIVE mg/dL
Hgb urine dipstick: NEGATIVE
Ketones, ur: NEGATIVE mg/dL
Leukocytes,Ua: NEGATIVE
Nitrite: NEGATIVE
Protein, ur: NEGATIVE mg/dL
Specific Gravity, Urine: 1.01 (ref 1.005–1.030)
WBC, UA: NONE SEEN WBC/hpf (ref 0–5)
pH: 5 (ref 5.0–8.0)

## 2019-04-22 LAB — LACTATE DEHYDROGENASE: LDH: 122 U/L (ref 98–192)

## 2019-04-22 LAB — TYPE AND SCREEN
ABO/RH(D): B POS
Antibody Screen: NEGATIVE

## 2019-04-22 LAB — RESPIRATORY PANEL BY RT PCR (FLU A&B, COVID)
Influenza A by PCR: NEGATIVE
Influenza B by PCR: NEGATIVE
SARS Coronavirus 2 by RT PCR: NEGATIVE

## 2019-04-22 SURGERY — REPAIR, HERNIA, INCISIONAL
Anesthesia: General

## 2019-04-22 MED ORDER — GLYCOPYRROLATE 0.2 MG/ML IJ SOLN
INTRAMUSCULAR | Status: DC | PRN
  Administered 2019-04-22: .2 mg via INTRAVENOUS

## 2019-04-22 MED ORDER — MORPHINE SULFATE (PF) 4 MG/ML IV SOLN
4.0000 mg | INTRAVENOUS | Status: DC | PRN
  Administered 2019-04-22 – 2019-04-24 (×4): 4 mg via INTRAVENOUS
  Filled 2019-04-22 (×4): qty 1

## 2019-04-22 MED ORDER — PROPOFOL 500 MG/50ML IV EMUL
INTRAVENOUS | Status: AC
  Filled 2019-04-22: qty 50

## 2019-04-22 MED ORDER — OXYCODONE HCL 5 MG PO TABS: 5.0000 mg | ORAL_TABLET | Freq: Once | ORAL | Status: DC | PRN

## 2019-04-22 MED ORDER — IOHEXOL 300 MG/ML  SOLN
100.0000 mL | Freq: Once | INTRAMUSCULAR | Status: AC | PRN
  Administered 2019-04-22: 100 mL via INTRAVENOUS

## 2019-04-22 MED ORDER — FUROSEMIDE 40 MG PO TABS
40.0000 mg | ORAL_TABLET | Freq: Every day | ORAL | Status: DC
  Administered 2019-04-23 – 2019-04-25 (×3): 40 mg via ORAL
  Filled 2019-04-22 (×5): qty 1

## 2019-04-22 MED ORDER — HYDROMORPHONE HCL 1 MG/ML IJ SOLN
INTRAMUSCULAR | Status: AC
  Filled 2019-04-22: qty 1

## 2019-04-22 MED ORDER — ALBUMIN HUMAN 5 % IV SOLN
INTRAVENOUS | Status: AC
  Filled 2019-04-22: qty 250

## 2019-04-22 MED ORDER — FENTANYL CITRATE (PF) 100 MCG/2ML IJ SOLN
INTRAMUSCULAR | Status: AC
  Filled 2019-04-22: qty 2

## 2019-04-22 MED ORDER — SUGAMMADEX SODIUM 200 MG/2ML IV SOLN
INTRAVENOUS | Status: DC | PRN
  Administered 2019-04-22: 200 mg via INTRAVENOUS

## 2019-04-22 MED ORDER — ESCITALOPRAM OXALATE 10 MG PO TABS: 10.0000 mg | ORAL_TABLET | Freq: Every day | ORAL | Status: DC

## 2019-04-22 MED ORDER — ENOXAPARIN SODIUM 40 MG/0.4ML ~~LOC~~ SOLN: 40.0000 mg | SUBCUTANEOUS | Status: DC

## 2019-04-22 MED ORDER — ENOXAPARIN SODIUM 40 MG/0.4ML ~~LOC~~ SOLN
40.0000 mg | SUBCUTANEOUS | Status: DC
  Administered 2019-04-23 – 2019-04-26 (×4): 40 mg via SUBCUTANEOUS
  Filled 2019-04-22 (×5): qty 0.4

## 2019-04-22 MED ORDER — OXYCODONE HCL 5 MG/5ML PO SOLN: 5.0000 mg | Freq: Once | ORAL | Status: DC | PRN

## 2019-04-22 MED ORDER — PANTOPRAZOLE SODIUM 40 MG IV SOLR
40.0000 mg | Freq: Every day | INTRAVENOUS | Status: DC
  Administered 2019-04-23 – 2019-04-26 (×5): 40 mg via INTRAVENOUS
  Filled 2019-04-22 (×5): qty 40

## 2019-04-22 MED ORDER — IBUPROFEN 400 MG PO TABS: 600.0000 mg | ORAL_TABLET | Freq: Four times a day (QID) | ORAL | Status: DC | PRN

## 2019-04-22 MED ORDER — MIDAZOLAM HCL 2 MG/2ML IJ SOLN
INTRAMUSCULAR | Status: DC | PRN
  Administered 2019-04-22: 2 mg via INTRAVENOUS

## 2019-04-22 MED ORDER — SUCCINYLCHOLINE CHLORIDE 200 MG/10ML IV SOSY
PREFILLED_SYRINGE | INTRAVENOUS | Status: AC
  Filled 2019-04-22: qty 10

## 2019-04-22 MED ORDER — PROPOFOL 10 MG/ML IV BOLUS
INTRAVENOUS | Status: AC
  Filled 2019-04-22: qty 20

## 2019-04-22 MED ORDER — HYDROMORPHONE HCL 1 MG/ML IJ SOLN
0.5000 mg | INTRAMUSCULAR | Status: AC | PRN
  Administered 2019-04-22 (×3): 0.5 mg via INTRAVENOUS
  Filled 2019-04-22 (×3): qty 1

## 2019-04-22 MED ORDER — FENTANYL CITRATE (PF) 100 MCG/2ML IJ SOLN
INTRAMUSCULAR | Status: DC | PRN
  Administered 2019-04-22: 100 ug via INTRAVENOUS

## 2019-04-22 MED ORDER — ONDANSETRON HCL 4 MG/2ML IJ SOLN
INTRAMUSCULAR | Status: AC
  Filled 2019-04-22: qty 2

## 2019-04-22 MED ORDER — ROCURONIUM BROMIDE 100 MG/10ML IV SOLN
INTRAVENOUS | Status: DC | PRN
  Administered 2019-04-22: 40 mg via INTRAVENOUS
  Administered 2019-04-22: 100 mg via INTRAVENOUS
  Administered 2019-04-22: 20 mg via INTRAVENOUS

## 2019-04-22 MED ORDER — ONDANSETRON HCL 4 MG/2ML IJ SOLN
4.0000 mg | Freq: Four times a day (QID) | INTRAMUSCULAR | Status: DC | PRN
  Administered 2019-04-23: 13:00:00 4 mg via INTRAVENOUS
  Filled 2019-04-22: qty 2

## 2019-04-22 MED ORDER — SPIRONOLACTONE 25 MG PO TABS
100.0000 mg | ORAL_TABLET | Freq: Every day | ORAL | Status: DC
  Administered 2019-04-23 – 2019-04-25 (×3): 100 mg via ORAL
  Filled 2019-04-22 (×5): qty 4

## 2019-04-22 MED ORDER — HYDROMORPHONE HCL 1 MG/ML IJ SOLN: 0.2500 mg | INTRAMUSCULAR | Status: DC | PRN

## 2019-04-22 MED ORDER — BUPIVACAINE-EPINEPHRINE 0.5% -1:200000 IJ SOLN
INTRAMUSCULAR | Status: DC | PRN
  Administered 2019-04-22: 30 mL

## 2019-04-22 MED ORDER — PROPOFOL 10 MG/ML IV BOLUS
INTRAVENOUS | Status: DC | PRN
  Administered 2019-04-22: 50 mg via INTRAVENOUS
  Administered 2019-04-22: 120 mg via INTRAVENOUS

## 2019-04-22 MED ORDER — LIDOCAINE HCL (PF) 2 % IJ SOLN
INTRAMUSCULAR | Status: AC
  Filled 2019-04-22: qty 10

## 2019-04-22 MED ORDER — EPINEPHRINE PF 1 MG/ML IJ SOLN
INTRAMUSCULAR | Status: AC
  Filled 2019-04-22: qty 1

## 2019-04-22 MED ORDER — SUCCINYLCHOLINE CHLORIDE 20 MG/ML IJ SOLN
INTRAMUSCULAR | Status: DC | PRN
  Administered 2019-04-22: 140 mg via INTRAVENOUS

## 2019-04-22 MED ORDER — PIPERACILLIN-TAZOBACTAM 3.375 G IVPB
3.3750 g | Freq: Three times a day (TID) | INTRAVENOUS | Status: DC
  Administered 2019-04-22 – 2019-04-27 (×14): 3.375 g via INTRAVENOUS
  Filled 2019-04-22 (×14): qty 50

## 2019-04-22 MED ORDER — ROCURONIUM BROMIDE 10 MG/ML (PF) SYRINGE
PREFILLED_SYRINGE | INTRAVENOUS | Status: AC
  Filled 2019-04-22: qty 20

## 2019-04-22 MED ORDER — MIDAZOLAM HCL 2 MG/2ML IJ SOLN
INTRAMUSCULAR | Status: AC
  Filled 2019-04-22: qty 2

## 2019-04-22 MED ORDER — ESCITALOPRAM OXALATE 10 MG PO TABS
10.0000 mg | ORAL_TABLET | Freq: Every day | ORAL | Status: DC
  Administered 2019-04-23 – 2019-04-26 (×4): 10 mg via ORAL
  Filled 2019-04-22 (×5): qty 1

## 2019-04-22 MED ORDER — ONDANSETRON 4 MG PO TBDP: 4.0000 mg | ORAL_TABLET | Freq: Four times a day (QID) | ORAL | Status: DC | PRN

## 2019-04-22 MED ORDER — SODIUM CHLORIDE 0.9 % IV SOLN: INTRAVENOUS | Status: DC

## 2019-04-22 MED ORDER — SODIUM CHLORIDE 0.9 % IV SOLN: INTRAVENOUS | Status: DC | PRN

## 2019-04-22 MED ORDER — DEXAMETHASONE SODIUM PHOSPHATE 10 MG/ML IJ SOLN
INTRAMUSCULAR | Status: AC
  Filled 2019-04-22: qty 1

## 2019-04-22 MED ORDER — VASOPRESSIN 20 UNIT/ML IV SOLN
INTRAVENOUS | Status: AC
  Filled 2019-04-22: qty 1

## 2019-04-22 MED ORDER — FENTANYL CITRATE (PF) 100 MCG/2ML IJ SOLN
25.0000 ug | INTRAMUSCULAR | Status: DC | PRN
  Administered 2019-04-22: 25 ug via INTRAVENOUS

## 2019-04-22 MED ORDER — OXYCODONE HCL 5 MG PO TABS
5.0000 mg | ORAL_TABLET | ORAL | Status: DC | PRN
  Administered 2019-04-23 – 2019-04-25 (×4): 10 mg via ORAL
  Administered 2019-04-26 (×2): 5 mg via ORAL
  Filled 2019-04-22: qty 1
  Filled 2019-04-22 (×2): qty 2
  Filled 2019-04-22: qty 1
  Filled 2019-04-22 (×2): qty 2

## 2019-04-22 MED ORDER — SPIRONOLACTONE 100 MG PO TABS: 100.0000 mg | ORAL_TABLET | Freq: Every day | ORAL | Status: DC

## 2019-04-22 MED ORDER — ALBUMIN HUMAN 5 % IV SOLN: INTRAVENOUS | Status: DC | PRN

## 2019-04-22 MED ORDER — PHENYLEPHRINE HCL (PRESSORS) 10 MG/ML IV SOLN
INTRAVENOUS | Status: DC | PRN
  Administered 2019-04-22: 100 ug via INTRAVENOUS
  Administered 2019-04-22: 200 ug via INTRAVENOUS
  Administered 2019-04-22: 300 ug via INTRAVENOUS

## 2019-04-22 MED ORDER — FIBRIN SEALANT 2 ML SINGLE DOSE KIT
PACK | CUTANEOUS | Status: DC | PRN
  Administered 2019-04-22: 2 mL via TOPICAL

## 2019-04-22 MED ORDER — VASOPRESSIN 20 UNIT/ML IV SOLN
INTRAVENOUS | Status: DC | PRN
  Administered 2019-04-22: 1 [IU] via INTRAVENOUS

## 2019-04-22 MED ORDER — ONDANSETRON HCL 4 MG/2ML IJ SOLN
4.0000 mg | Freq: Once | INTRAMUSCULAR | Status: AC
  Administered 2019-04-22: 4 mg via INTRAVENOUS
  Filled 2019-04-22: qty 2

## 2019-04-22 MED ORDER — BUPIVACAINE HCL (PF) 0.5 % IJ SOLN
INTRAMUSCULAR | Status: AC
  Filled 2019-04-22: qty 30

## 2019-04-22 SURGICAL SUPPLY — 34 items
BLADE SURG 15 STRL LF DISP TIS (BLADE) ×1 IMPLANT
BLADE SURG 15 STRL SS (BLADE) ×2
CANISTER SUCT 1200ML W/VALVE (MISCELLANEOUS) ×3 IMPLANT
CHLORAPREP W/TINT 26 (MISCELLANEOUS) ×3 IMPLANT
COVER WAND RF STERILE (DRAPES) ×3 IMPLANT
DERMABOND ADVANCED (GAUZE/BANDAGES/DRESSINGS) ×2
DERMABOND ADVANCED .7 DNX12 (GAUZE/BANDAGES/DRESSINGS) ×1 IMPLANT
DRAPE LAPAROTOMY 100X77 ABD (DRAPES) ×3 IMPLANT
ELECT REM PT RETURN 9FT ADLT (ELECTROSURGICAL) ×3
ELECTRODE REM PT RTRN 9FT ADLT (ELECTROSURGICAL) ×1 IMPLANT
GAUZE SPONGE 4X4 12PLY STRL (GAUZE/BANDAGES/DRESSINGS) IMPLANT
GLOVE BIO SURGEON STRL SZ 6.5 (GLOVE) ×2 IMPLANT
GLOVE BIO SURGEONS STRL SZ 6.5 (GLOVE) ×1
GLOVE BIOGEL PI IND STRL 6.5 (GLOVE) ×1 IMPLANT
GLOVE BIOGEL PI INDICATOR 6.5 (GLOVE) ×2
GOWN STRL REUS W/ TWL LRG LVL3 (GOWN DISPOSABLE) ×1 IMPLANT
GOWN STRL REUS W/TWL LRG LVL3 (GOWN DISPOSABLE) ×2
KIT TURNOVER KIT A (KITS) ×3 IMPLANT
LABEL OR SOLS (LABEL) ×3 IMPLANT
LIGASURE IMPACT 36 18CM CVD LR (INSTRUMENTS) ×3 IMPLANT
NEEDLE HYPO 25X1 1.5 SAFETY (NEEDLE) ×3 IMPLANT
NS IRRIG 500ML POUR BTL (IV SOLUTION) ×3 IMPLANT
PACK BASIN MINOR ARMC (MISCELLANEOUS) ×3 IMPLANT
RELOAD PROXIMATE 75MM BLUE (ENDOMECHANICALS) ×6 IMPLANT
STAPLER PROXIMATE 75MM BLUE (STAPLE) ×3 IMPLANT
SUT MNCRL 4-0 (SUTURE) ×2
SUT MNCRL 4-0 27XMFL (SUTURE) ×1
SUT PDS PLUS 0 (SUTURE) ×2
SUT PDS PLUS AB 0 CT-2 (SUTURE) ×1 IMPLANT
SUT PROLENE 0 CT 2 (SUTURE) ×6 IMPLANT
SUT VIC AB 2-0 SH 27 (SUTURE) ×2
SUT VIC AB 2-0 SH 27XBRD (SUTURE) ×1 IMPLANT
SUTURE MNCRL 4-0 27XMF (SUTURE) ×1 IMPLANT
SYR 10ML LL (SYRINGE) ×3 IMPLANT

## 2019-04-22 NOTE — ED Provider Notes (Signed)
[facility] Emergency Department Provider Note  ____________________________________________   First MD Initiated Contact with Patient 04/22/19 1054     (approximate)  I have reviewed the triage vital signs and the nursing notes.   HISTORY  Chief Complaint Abdominal Pain    HPI Timothy Johns is a 54 y.o. male with cirrhosis and ascites who comes in for abdominal pain.  Patient states that he is having upper abdominal pain that is severe, constant, onset today, not better with Pepto-Bismol, nothing makes it worse.  States he feels like his prior pancreatitis but he denies having alcohol in over a year.  He also reports 2 episodes of nonbloody nonbilious vomiting.  He states that maybe this is from food poisoning because he ate some frozen food yesterday.  He states that he has not had a paracentesis done since his admission in January.  Does not feel he has a ton of fluid on him or feel it is reinfected.  Patient has a chronic midline hernia with a little bit of skin flaking on top of it.  States that the redness has not come back since his prior admission and this is looking much better than previously.   On review of patient's prior records patient had admission in January 2021 where he had a CT scan showing a subcutaneous abscess under an ulcerated area in his mid abdomen.  Surgery was consulted but it was felt best managed with antibiotics only          Past Medical History:  Diagnosis Date  . Cirrhosis (HCC)   . Hypertension     Patient Active Problem List   Diagnosis Date Noted  . Nonhealing nonsurgical wound limited to breakdown of skin 03/01/2019  . Current moderate episode of major depressive disorder without prior episode (HCC)   . Bleeding ulcer 02/12/2019  . Abdominal pain   . Abscess   . Acute on chronic cholecystitis 05/31/2015  . Cholelithiasis 05/31/2015  . Leukocytosis 05/31/2015  . Hyperbilirubinemia 05/31/2015  .  Dehydration 05/31/2015  . Hypomagnesemia 05/31/2015  . Tobacco abuse counseling 05/31/2015  . Acute pancreatitis 05/27/2015  . History of syncope 05/13/2014  . Tobacco use 05/13/2014  . Ascites due to alcoholic cirrhosis (HCC) 05/13/2014  . Alcoholism (HCC) 05/13/2014  . Obesity 05/13/2014  . Insomnia 05/13/2014    Past Surgical History:  Procedure Laterality Date  . CHOLECYSTECTOMY N/A 05/30/2015   Procedure: LAPAROSCOPIC CHOLECYSTECTOMY WITH INTRAOPERATIVE CHOLANGIOGRAM;  Surgeon: Leafy Ro, MD;  Location: [facility] ORS;  Service: General;  Laterality: N/A;  . PARACENTESIS      Prior to Admission medications   Medication Sig Start Date End Date Taking? Authorizing Provider  b complex vitamins tablet Take 1 tablet by mouth daily.    [provider]  escitalopram (LEXAPRO) 10 MG tablet Take 1 tablet (10 mg total) by mouth daily. 02/15/19   Danford, Earl Lites, MD  furosemide (LASIX) 40 MG tablet Take 40 mg by mouth daily. 01/27/19   [provider]  Multiple Vitamin (MULTIVITAMIN) tablet Take 1 tablet by mouth daily.    [provider]  Silver (AQUACEL AG FOAM) [id number]" PADS Apply 1 Container topically daily as needed. Patient not taking: Reported on 03/22/2019 02/15/19   Donovan Kail, PA-C  spironolactone (ALDACTONE) 50 MG tablet Take 3 tablets (150 mg total) by mouth daily. 02/15/19   Danford, Earl Lites, MD  thiamine 100 MG tablet Take 100 mg by mouth daily.    [provider]    Allergies Tylenol [acetaminophen]  Family History  Problem Relation Age of Onset  . Heart disease Mother   . Vaginal cancer Mother   . Arthritis Father   . Diabetes Brother   . Heart disease Maternal Grandfather        dx in her 49s    Social History Social History   Tobacco Use  . Smoking status: Former Smoker    Packs/day: 0.50    Years: 20.00    Pack years: 10.00    Types: Cigarettes  . Smokeless tobacco: Never Used  . Tobacco comment: 1-3 packs a  week, he is wearing a patch  Substance Use Topics  . Alcohol use: No    Alcohol/week: 0.0 standard drinks    Comment: Hx of alcohol abuse, quit 8 months ago  . Drug use: No    Comment: Hx of marijuana use      Review of Systems Constitutional: No fever/chills Eyes: No visual changes. ENT: No sore throat. Cardiovascular: Denies chest pain. Respiratory: Denies shortness of breath. Gastrointestinal: Abdominal pain and vomiting no diarrhea.  No constipation. Genitourinary: Negative for dysuria. Musculoskeletal: Negative for back pain. Skin: Negative for rash. Neurological: Negative for headaches, focal weakness or numbness. All other ROS negative ____________________________________________   PHYSICAL EXAM:  VITAL SIGNS: ED Triage Vitals  Enc Vitals Group     BP --      Pulse --      Resp --      Temp --      Temp src --      SpO2 --      Weight 04/22/19 1052 170 lb (77.1 kg)     Height 04/22/19 1052 5\' 10"  (1.778 m)     Head Circumference --      Peak Flow --      Pain Score 04/22/19 1048 8     Pain Loc --      Pain Edu? --      Excl. in GC? --     Constitutional: Alert and oriented. Well appearing and in no acute distress. Eyes: Conjunctivae are normal. EOMI. Head: Atraumatic. Nose: No congestion/rhinnorhea. Mouth/Throat: Mucous membranes are moist.   Neck: No stridor. Trachea Midline. FROM Cardiovascular: Normal rate, regular rhythm. Grossly normal heart sounds.  Good peripheral circulation. Respiratory: Normal respiratory effort.  No retractions. Lungs CTAB. Gastrointestinal: Umbilical hernia with some overlying cracked skin mild erythema but that does not feel warm to touch.  However tenderness is on the upper abdomen.  Musculoskeletal: No lower extremity tenderness nor edema.  No joint effusions. Neurologic:  Normal speech and language. No gross focal neurologic deficits are appreciated.  Skin:  Skin is warm, dry and intact. No rash noted. Psychiatric:  Mood and affect are normal. Speech and behavior are normal. GU: Deferred   ____________________________________________   LABS (all labs ordered are listed, but only abnormal results are displayed)  Labs Reviewed  COMPREHENSIVE METABOLIC PANEL - Abnormal; Notable for the following components:      Result Value   CO2 19 (*)    Glucose, Bld 160 (*)    BUN 45 (*)    Creatinine, Ser 1.33 (*)    Alkaline Phosphatase 132 (*)    Total Bilirubin 1.4 (*)    All other components within normal limits  CBC - Abnormal; Notable for the following components:   WBC 11.9 (*)    RBC 3.57 (*)    Hemoglobin 11.8 (*)    HCT  34.2 (*)    Platelets 126 (*)    All other components within normal limits  URINALYSIS, COMPLETE (UACMP) WITH MICROSCOPIC - Abnormal; Notable for the following components:   Color, Urine YELLOW (*)    APPearance CLEAR (*)    All other components within normal limits  RESPIRATORY PANEL BY RT PCR (FLU A&B, COVID)  LIPASE, BLOOD  LACTATE DEHYDROGENASE   ____________________________________________   ED ECG REPORT I, Vanessa [place], the attending physician, personally viewed and interpreted this ECG. EKG is sinus rate of 58, no ST elevation, no T wave inversions, normal intervals, some artifact noted  ____________________________________________  RADIOLOGY   Official radiology report(s): CT ABDOMEN PELVIS W CONTRAST  Result Date: 04/22/2019 CLINICAL DATA:  Abdominal pain since 7 a.m. this morning. Vomiting. EXAM: CT ABDOMEN AND PELVIS WITH CONTRAST TECHNIQUE: Multidetector CT imaging of the abdomen and pelvis was performed using the standard protocol following bolus administration of intravenous contrast. CONTRAST:  150mL OMNIPAQUE IOHEXOL 300 MG/ML  SOLN COMPARISON:  CT scan 02/12/2019 FINDINGS: Lower chest: The lung bases are clear of acute process. No pleural effusion or pulmonary lesions. The heart is normal in size. No pericardial effusion. The distal esophagus and aorta  are unremarkable. Hepatobiliary: Advanced cirrhotic changes involving the liver but no worrisome hepatic lesions are identified. No intra or extrahepatic biliary dilatation. The gallbladder is surgically absent. Stable changes portal venous hypertension, portal venous collaterals and splenomegaly. No ascites. Pancreas: No mass, inflammation or ductal dilatation. Spleen: Mild stable splenomegaly. No splenic lesions. Adrenals/Urinary Tract: The adrenal glands and kidneys are unremarkable and stable. The bladder appears normal. Stomach/Bowel: The stomach and duodenum are unremarkable. High attenuation material in the stomach could reflect medication such as Toms or Pepto-Bismol. The proximal small bowel loops are dilated. This is secondary to a anterior abdominal wall hernia containing a bowel loop which is dilated. The bowel loops after the hernia are decompressed/normal in caliber. There is a significant amount of fluid in the hernia sac. The colon is unremarkable. No acute inflammatory process, mass lesions or obstructive findings. Vascular/Lymphatic: The aorta and branch vessels are patent. The major venous structures are patent. Numerous small scattered mesenteric and retroperitoneal lymph nodes, typical with cirrhosis. Reproductive: The prostate gland and seminal vesicles are unremarkable. Other: No pelvic mass or pelvic adenopathy. No free pelvic fluid collections. No inguinal mass or hernia. 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 No acute bony findings. IMPRESSION: 1. Anterior abdominal wall hernia containing an incarcerated loop of small bowel and causing a high-grade small bowel obstruction. 2. Advanced cirrhotic changes involving the liver with portal venous hypertension, portal venous collaterals and splenomegaly. No ascites. 3. Status post cholecystectomy. No biliary dilatation. 4. Chronic bilateral hip AVN with head  collapse on the right side and severe right hip joint degenerative changes. Electronically Signed   By: Marijo Sanes M.D.   On: 04/22/2019 13:15    ____________________________________________   PROCEDURES  Procedure(s) performed (including Critical Care):  Procedures   ____________________________________________   INITIAL IMPRESSION / ASSESSMENT AND PLAN / ED COURSE  NYAL SCHACHTER was evaluated in Emergency Department on 04/22/2019 for the symptoms described in the history of present illness. He was evaluated in the context of the global COVID-19 pandemic, which necessitated consideration that the patient might be at risk for infection with the SARS-CoV-2 virus that causes COVID-19. Institutional protocols and algorithms that pertain to the evaluation of patients at risk  for COVID-19 are in a state of rapid change based on information released by regulatory bodies including the CDC and federal and state organizations. These policies and algorithms were followed during the patient's care in the ED.    Patient is a 54 year old with cirrhosis who comes in with upper abdominal pain.  Will get repeat CT imaging to make sure no evidence of hernia, SBO, pancreatitis, cholecystitis.  Will treat with IV pain medication.  If CT is negative we will need to consider doing paracentesis although his abdomen feels kind of flat so not sure there will be any fluid pockets.  We will give patient IV pain medications for significant pain   Labs are reassuring.  White count slightly elevated but lipase was negative therefore unlikely pancreatitis.  LFTs slightly elevated consistent with his priors most likely from his cirrhosis.  CT imaging shows incarcerated loop of small bowel with high-grade small bowel obstruction.  No ascites.  Patient has chronic changes of his pelvis.  Discussed with Dr. Maia Plan who will come down and evaluate patient.         ____________________________________________   FINAL CLINICAL IMPRESSION(S) / ED DIAGNOSES   Final diagnoses:  Small bowel obstruction (HCC)      MEDICATIONS GIVEN DURING THIS VISIT:  Medications  HYDROmorphone (DILAUDID) injection 0.5 mg (0.5 mg Intravenous Given 04/22/19 1348)  ondansetron (ZOFRAN) injection 4 mg (4 mg Intravenous Given 04/22/19 1122)  iohexol (OMNIPAQUE) 300 MG/ML solution 100 mL (100 mLs Intravenous Contrast Given 04/22/19 1253)     ED Discharge Orders    None       Note:  This document was prepared using Dragon voice recognition software and may include unintentional dictation errors.   Concha Se, MD 04/22/19 [phone]

## 2019-04-22 NOTE — ED Notes (Signed)
Report given to Cierra, RN

## 2019-04-22 NOTE — Transfer of Care (Signed)
Immediate Anesthesia Transfer of Care Note  Patient: Timothy Johns  Procedure(s) Performed: HERNIA REPAIR INCISIONAL (N/A )  Patient Location: PACU  Anesthesia Type:General  Level of Consciousness: sedated  Airway & Oxygen Therapy: Patient Spontanous Breathing and Patient connected to face mask oxygen  Post-op Assessment: Report given to RN and Post -op Vital signs reviewed and stable  Post vital signs: Reviewed and stable  Last Vitals:  Vitals Value Taken Time  BP 96/53 04/22/19 2336  Temp    Pulse 76 04/22/19 2340  Resp 15 04/22/19 2340  SpO2 100 % 04/22/19 2340  Vitals shown include unvalidated device data.  Last Pain:  Vitals:   04/22/19 1735  TempSrc:   PainSc: Asleep         Complications: No apparent anesthesia complications

## 2019-04-22 NOTE — Anesthesia Procedure Notes (Signed)
Procedure Name: Intubation Date/Time: 04/22/2019 9:42 PM Performed by: Almeta Monas, CRNA Pre-anesthesia Checklist: Patient identified, Patient being monitored, Timeout performed, Emergency Drugs available and Suction available Patient Re-evaluated:Patient Re-evaluated prior to induction Oxygen Delivery Method: Circle system utilized Preoxygenation: Pre-oxygenation with 100% oxygen Induction Type: IV induction and Rapid sequence Laryngoscope Size: 3 and McGraph Grade View: Grade I Tube type: Oral Tube size: 7.0 mm Number of attempts: 1 Airway Equipment and Method: Stylet and Video-laryngoscopy Placement Confirmation: ETT inserted through vocal cords under direct vision,  positive ETCO2 and breath sounds checked- equal and bilateral Secured at: 21 cm Tube secured with: Tape Dental Injury: Teeth and Oropharynx as per pre-operative assessment

## 2019-04-22 NOTE — ED Triage Notes (Addendum)
Pt c/o abdominal pain since 7am this morning, states its either food poisoning or pancreatitis.  Has hx of pancreatitis states feels similar.  Pt states he has not had any alcohol since Feb 2020, states he ate [employer] lasagna last night and a lean cuisine this morning.  Pt vomited x 2 this morning.

## 2019-04-22 NOTE — H&P (Addendum)
SURGICAL CONSULTATION NOTE   HISTORY OF PRESENT ILLNESS (HPI):  54 y.o. male presented to [facility] ED for evaluation of abdominal pain since earlier this morning. Patient reports the pain has been sharp in the upper abdomen, constant and severe.  He tried Pepto-Bismol without improvement.  He had some nausea and vomiting this morning.  He thought that it was an episode of pancreatitis and he decided to come to the ED.  At the ED CT scan of the abdomen and pelvis was done.  This shows a ventral hernia with incarcerated small bowel with high-grade obstruction.  There is no significant ascites on the CT scan.  Labs shows mild leukocytosis.  Platelets over 100.  There is no significant electrolyte disturbance.  Surgery is consulted by Dr. Fuller Plan in this context for evaluation and management of ventral hernia with obstruction.  PAST MEDICAL HISTORY (PMH):  Past Medical History:  Diagnosis Date  . Cirrhosis (HCC)   . Hypertension      PAST SURGICAL HISTORY (PSH):  Past Surgical History:  Procedure Laterality Date  . CHOLECYSTECTOMY N/A 05/30/2015   Procedure: LAPAROSCOPIC CHOLECYSTECTOMY WITH INTRAOPERATIVE CHOLANGIOGRAM;  Surgeon: Leafy Ro, MD;  Location: [facility] ORS;  Service: General;  Laterality: N/A;  . PARACENTESIS       MEDICATIONS:  Prior to Admission medications   Medication Sig Start Date End Date Taking? Authorizing Provider  b complex vitamins tablet Take 1 tablet by mouth daily.   Yes [provider]  escitalopram (LEXAPRO) 10 MG tablet Take 1 tablet (10 mg total) by mouth daily. 02/15/19  Yes Danford, Earl Lites, MD  furosemide (LASIX) 40 MG tablet Take 40 mg by mouth daily. 01/27/19  Yes [provider]  Multiple Vitamin (MULTIVITAMIN) tablet Take 1 tablet by mouth daily.   Yes [provider]  spironolactone (ALDACTONE) 50 MG tablet Take 3 tablets (150 mg total) by mouth daily. Patient taking differently: Take 100-150 mg by mouth daily.  02/15/19  Yes  Danford, Earl Lites, MD  thiamine 100 MG tablet Take 100 mg by mouth daily.   Yes [provider]  Turmeric 400 MG CAPS Take 400 mg by mouth daily.   Yes [provider]     ALLERGIES:  Allergies  Allergen Reactions  . Tylenol [Acetaminophen] Other (See Comments)    Pt. states PCP told him not to take because of his liver.     SOCIAL HISTORY:  Social History   Socioeconomic History  . Marital status: Divorced    Spouse name: Not on file  . Number of children: Not on file  . Years of education: Not on file  . Highest education level: Not on file  Occupational History  . Not on file  Tobacco Use  . Smoking status: Former Smoker    Packs/day: 0.50    Years: 20.00    Pack years: 10.00    Types: Cigarettes  . Smokeless tobacco: Never Used  . Tobacco comment: 1-3 packs a week, he is wearing a patch  Substance and Sexual Activity  . Alcohol use: No    Alcohol/week: 0.0 standard drinks    Comment: Hx of alcohol abuse, quit 8 months ago  . Drug use: No    Comment: Hx of marijuana use  . Sexual activity: Not on file  Other Topics Concern  . Not on file  Social History Narrative  . Not on file   Social Determinants of Health   Financial Resource Strain:   . Difficulty of  Paying Living Expenses:   Food Insecurity:   . Worried About Programme researcher, broadcasting/film/video in the Last Year:   . Barista in the Last Year:   Transportation Needs:   . Freight forwarder (Medical):   Marland Kitchen Lack of Transportation (Non-Medical):   Physical Activity:   . Days of Exercise per Week:   . Minutes of Exercise per Session:   Stress:   . Feeling of Stress :   Social Connections:   . Frequency of Communication with Friends and Family:   . Frequency of Social Gatherings with Friends and Family:   . Attends Religious Services:   . Active Member of Clubs or Organizations:   . Attends Banker Meetings:   Marland Kitchen Marital Status:   Intimate Partner Violence:   . Fear of  Current or Ex-Partner:   . Emotionally Abused:   Marland Kitchen Physically Abused:   . Sexually Abused:       FAMILY HISTORY:  Family History  Problem Relation Age of Onset  . Heart disease Mother   . Vaginal cancer Mother   . Arthritis Father   . Diabetes Brother   . Heart disease Maternal Grandfather        dx in her 90s     REVIEW OF SYSTEMS:  Constitutional: denies weight loss, fever, chills, or sweats  Eyes: denies any other vision changes, history of eye injury  ENT: denies sore throat, hearing problems  Respiratory: denies shortness of breath, wheezing  Cardiovascular: denies chest pain, palpitations  Gastrointestinal: abdominal pain, nausea and vomiting Genitourinary: denies burning with urination or urinary frequency Musculoskeletal: denies any other joint pains or cramps  Skin: denies any other rashes or skin discolorations  Neurological: denies any other headache, dizziness, weakness  Psychiatric: denies any other depression, anxiety   All other review of systems were negative   VITAL SIGNS:  Temp:  [98 F (36.7 C)] 98 F (36.7 C) (04/04 1054) Pulse Rate:  [55-56] 55 (04/04 1100) Resp:  [18] 18 (04/04 1054) BP: (102-105)/(51-57) 102/57 (04/04 1100) SpO2:  [100 %] 100 % (04/04 1100) Weight:  [77.1 kg] 77.1 kg (04/04 1052)     Height: 5\' 10"  (177.8 cm) Weight: 77.1 kg BMI (Calculated): 24.39   INTAKE/OUTPUT:  This shift: No intake/output data recorded.  Last 2 shifts: @IOLAST2SHIFTS @   PHYSICAL EXAM:  Constitutional:  -- Normal body habitus  -- Awake, alert, and oriented x3  Eyes:  -- Pupils equally round and reactive to light  -- No scleral icterus  Ear, nose, and throat:  -- No jugular venous distension  Pulmonary:  -- No crackles  -- Equal breath sounds bilaterally -- Breathing non-labored at rest Cardiovascular:  -- S1, S2 present  -- No pericardial rubs Gastrointestinal:  -- Abdomen soft, nontender, distended, no guarding or rebound tenderness.  Medium  size ventral hernia with significant amount of small bowel and fluid incarcerated. -- No abdominal masses appreciated, pulsatile or otherwise  Musculoskeletal and Integumentary:  -- Wounds: None appreciated -- Extremities: B/L UE and LE FROM, hands and feet warm, no edema  Neurologic:  -- Motor function: intact and symmetric -- Sensation: intact and symmetric      Labs:  CBC Latest Ref Rng & Units 04/22/2019 02/14/2019 02/12/2019  WBC 4.0 - 10.5 K/uL 11.9(H) 8.4 9.7  Hemoglobin 13.0 - 17.0 g/dL 11.8(L) 11.3(L) 13.5  Hematocrit 39.0 - 52.0 % 34.2(L) 32.7(L) 40.0  Platelets 150 - 400 K/uL 126(L) 155 163   CMP  Latest Ref Rng & Units 04/22/2019 02/14/2019 02/13/2019  Glucose 70 - 99 mg/dL 160(H) 107(H) -  BUN 6 - 20 mg/dL 45(H) 44(H) -  Creatinine 0.61 - 1.24 mg/dL 1.33(H) 1.33(H) 1.35(H)  Sodium 135 - 145 mmol/L 136 136 -  Potassium 3.5 - 5.1 mmol/L 4.3 4.6 -  Chloride 98 - 111 mmol/L 104 105 -  CO2 22 - 32 mmol/L 19(L) 23 -  Calcium 8.9 - 10.3 mg/dL 9.8 9.7 -  Total Protein 6.5 - 8.1 g/dL 7.5 7.0 -  Total Bilirubin 0.3 - 1.2 mg/dL 1.4(H) 1.4(H) -  Alkaline Phos 38 - 126 U/L 132(H) 139(H) -  AST 15 - 41 U/L 37 26 -  ALT 0 - 44 U/L 43 27 -    Imaging studies:  EXAM: CT ABDOMEN AND PELVIS WITH CONTRAST  TECHNIQUE: Multidetector CT imaging of the abdomen and pelvis was performed using the standard protocol following bolus administration of intravenous contrast.  CONTRAST:  170mL OMNIPAQUE IOHEXOL 300 MG/ML  SOLN  COMPARISON:  CT scan 02/12/2019  FINDINGS: Lower chest: The lung bases are clear of acute process. No pleural effusion or pulmonary lesions. The heart is normal in size. No pericardial effusion. The distal esophagus and aorta are unremarkable.  Hepatobiliary: Advanced cirrhotic changes involving the liver but no worrisome hepatic lesions are identified. No intra or extrahepatic biliary dilatation. The gallbladder is surgically absent. Stable changes portal  venous hypertension, portal venous collaterals and splenomegaly. No ascites.  Pancreas: No mass, inflammation or ductal dilatation.  Spleen: Mild stable splenomegaly. No splenic lesions.  Adrenals/Urinary Tract: The adrenal glands and kidneys are unremarkable and stable. The bladder appears normal.  Stomach/Bowel: The stomach and duodenum are unremarkable. High attenuation material in the stomach could reflect medication such as Toms or Pepto-Bismol.  The proximal small bowel loops are dilated. This is secondary to a anterior abdominal wall hernia containing a bowel loop which is dilated. The bowel loops after the hernia are decompressed/normal in caliber. There is a significant amount of fluid in the hernia sac.  The colon is unremarkable. No acute inflammatory process, mass lesions or obstructive findings.  Vascular/Lymphatic: The aorta and branch vessels are patent. The major venous structures are patent. Numerous small scattered mesenteric and retroperitoneal lymph nodes, typical with cirrhosis.  Reproductive: The prostate gland and seminal vesicles are unremarkable.  Other: No pelvic mass or pelvic adenopathy. No free pelvic fluid collections. No inguinal mass or hernia.  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 No acute bony findings.  IMPRESSION: 1. Anterior abdominal wall hernia containing an incarcerated loop of small bowel and causing a high-grade small bowel obstruction. 2. Advanced cirrhotic changes involving the liver with portal venous hypertension, portal venous collaterals and splenomegaly. No ascites. 3. Status post cholecystectomy. No biliary dilatation. 4. Chronic bilateral hip AVN with head collapse on the right side and severe right hip joint degenerative changes.   Electronically Signed   By: Marijo Sanes M.D.   On: 04/22/2019  13:15  Assessment/Plan:  54 y.o. male with ventral hernia with small bowel obstruction, complicated by pertinent comorbidities including hypertension and cirrhosis.  Patient with known ventral hernia with new onset of small bowel incarcerated with obstruction.  He was oriented about this finding.  Due to the amount of pain and unable to reduce at bedside I discussed with the patient the recommendation of proceeding with incisional hernia repair.  Patient oriented about the possibility  of small bowel resection if it is nonviable.  Will make decision Intra-Op about mesh placement or primary repair depending on the amount of contamination.  Patient oriented about the risk of injury to bowel, bleeding, infection.  Also patient high risk of complication due to his history of cirrhosis including worsening of liver disease.  Patient understood and agreed to proceed.  Gae Gallop, MD

## 2019-04-22 NOTE — Anesthesia Preprocedure Evaluation (Signed)
Anesthesia Evaluation  Patient identified by MRN, date of birth, ID band Patient awake    Reviewed: Allergy & Precautions, H&P , NPO status , Patient's Chart, lab work & pertinent test results  History of Anesthesia Complications Negative for: history of anesthetic complications  Airway Mallampati: II  TM Distance: >3 FB Neck ROM: full    Dental  (+) Chipped, Poor Dentition   Pulmonary COPD, former smoker,           Cardiovascular hypertension,      Neuro/Psych PSYCHIATRIC DISORDERS negative neurological ROS     GI/Hepatic Neg liver ROS, PUD, GERD  Medicated and Controlled,  Endo/Other  negative endocrine ROS  Renal/GU      Musculoskeletal   Abdominal   Peds  Hematology negative hematology ROS (+)   Anesthesia Other Findings Past Medical History: No date: Cirrhosis (HCC) No date: Hypertension  Past Surgical History: 05/30/2015: CHOLECYSTECTOMY; N/A     Comment:  Procedure: LAPAROSCOPIC CHOLECYSTECTOMY WITH               INTRAOPERATIVE CHOLANGIOGRAM;  Surgeon: Leafy Ro,               MD;  Location: [facility] ORS;  Service: General;  Laterality:               N/A; No date: PARACENTESIS  BMI    Body Mass Index: 24.39 kg/m      Reproductive/Obstetrics negative OB ROS                             Anesthesia Physical Anesthesia Plan  ASA: IV  Anesthesia Plan: General ETT, Rapid Sequence and Cricoid Pressure   Post-op Pain Management:    Induction: Intravenous  PONV Risk Score and Plan: Ondansetron, Dexamethasone, Midazolam and Treatment may vary due to age or medical condition  Airway Management Planned: Oral ETT  Additional Equipment:   Intra-op Plan:   Post-operative Plan: Extubation in OR and Possible Post-op intubation/ventilation  Informed Consent: I have reviewed the patients History and Physical, chart, labs and discussed the procedure including the risks,  benefits and alternatives for the proposed anesthesia with the patient or authorized representative who has indicated his/her understanding and acceptance.     Dental Advisory Given  Plan Discussed with: Anesthesiologist, CRNA and Surgeon  Anesthesia Plan Comments: (Patient consented for risks of anesthesia including but not limited to:  - adverse reactions to medications - damage to teeth, lips or other oral mucosa - sore throat or hoarseness - Damage to heart, brain, lungs or loss of life  Patient voiced understanding.)        Anesthesia Quick Evaluation

## 2019-04-22 NOTE — Op Note (Addendum)
Preoperative diagnosis: Ventral incarcerated hernia.  Postoperative diagnosis: Ventral strangulated hernia.  Procedure: Ventral hernia repair                       Small bowel resection with anastomosis  Anesthesia: GETA  Surgeon: Dr. Hazle Quant  Wound Classification: Clean  Indications:Patient is a 54 y.o. male developed a ventral hernia with acute pain. CT scan showed high grade bowel obstruction.   Findings: 1. A 2 cm hernia with strangulated small bowel.  2. No hollow viscus organ injury identified during procedure 3. Tension free repair achieved 4. Adequate hemostasis  Description of procedure: The patient was brought to the operating room and general anesthesia was induced. A time-out was completed verifying correct patient, procedure, site, positioning, and implant(s) and/or special equipment prior to beginning this procedure. Antibiotics were administered prior to making the incision. The anterior abdominal wall was prepped and draped in the standard sterile fashion. An elliptical horizontal incision was made around the large skin excess over the hernia. The incision was deepened to the fascia and a large amount of ascites and turbid fluid was drained.  The hernia sac was then identified and dissected free. The peritoneum of the sac was strangulated small bowel was identified. Decision to make resection was done. The fascia was opened to be able to mobilize the small bowel. Even when the tension was released the purple color of the small bowel remained after 10 minutes and warm lap pad placed. With the extension of the fascia, the small bowel was able to be eviscerated. A window was created by using a curved hemostat to separate the mesentery from the bowel at each resection margin. The mesentery was scored and serially divided with LigaSure. The bowel was divided with a cutting linear stapler at each resection margin and passed off the table as a specimen. The antimesenteric angles  of the proximal and distal segments were then approximated with two sutures of 3-0 silk placed approximately 5 cm apart. Enterotomies were made at the antimesenteric borders and the cutting linear stapler inserted and fired. The lumen was inspected for hemostasis. The enterotomies were closed with a linear stapler. The anastomosis was then inspected for patency and integrity. The mesenteric defect was closed with a running 3-0 Vicryl suture. Evista seal applied over enterotomy closure.  The remaining small bowel appeared viable.  Due to the amount of contamination from the turbid fluid in the hernia sac, the need of small bowel resection with small amount of spillage and the risk of bacterial peritonitis due to history of ascites, decision was done to repair hernia primarily. The fascia was carefully palpated no additional defect was identified. The anterior fascia was then closed with a running PDS 0 suture on midline. A drain was left inside the abdominal cavity to control ascites and prevent distention of the abdominal wall. Dead space was closed with 3-0 Vicryl and the skin was closed with skin stapler.  The patient tolerated the procedure well and was brought to the postanesthesia care unit in stable condition.   Specimen: Small bowel                     Hernia sac  Complications: None  Estimated Blood Loss: 50 mL

## 2019-04-23 LAB — CBC
HCT: 31.3 % — ABNORMAL LOW (ref 39.0–52.0)
Hemoglobin: 10.7 g/dL — ABNORMAL LOW (ref 13.0–17.0)
MCH: 33.1 pg (ref 26.0–34.0)
MCHC: 34.2 g/dL (ref 30.0–36.0)
MCV: 96.9 fL (ref 80.0–100.0)
Platelets: 108 10*3/uL — ABNORMAL LOW (ref 150–400)
RBC: 3.23 MIL/uL — ABNORMAL LOW (ref 4.22–5.81)
RDW: 13.1 % (ref 11.5–15.5)
WBC: 7 10*3/uL (ref 4.0–10.5)
nRBC: 0 % (ref 0.0–0.2)

## 2019-04-23 LAB — COMPREHENSIVE METABOLIC PANEL
ALT: 35 U/L (ref 0–44)
AST: 30 U/L (ref 15–41)
Albumin: 4 g/dL (ref 3.5–5.0)
Alkaline Phosphatase: 95 U/L (ref 38–126)
Anion gap: 8 (ref 5–15)
BUN: 40 mg/dL — ABNORMAL HIGH (ref 6–20)
CO2: 22 mmol/L (ref 22–32)
Calcium: 9.4 mg/dL (ref 8.9–10.3)
Chloride: 106 mmol/L (ref 98–111)
Creatinine, Ser: 1.45 mg/dL — ABNORMAL HIGH (ref 0.61–1.24)
GFR calc Af Amer: >60 mL/min (ref >60)
GFR calc non Af Amer: 55 mL/min — ABNORMAL LOW (ref >60)
Glucose, Bld: 148 mg/dL — ABNORMAL HIGH (ref 70–99)
Potassium: 4.9 mmol/L (ref 3.5–5.1)
Sodium: 136 mmol/L (ref 135–145)
Total Bilirubin: 2 mg/dL — ABNORMAL HIGH (ref 0.3–1.2)
Total Protein: 7.1 g/dL (ref 6.5–8.1)

## 2019-04-23 LAB — PROTIME-INR
INR: 1.2 (ref 0.8–1.2)
Prothrombin Time: 15.1 seconds (ref 11.4–15.2)

## 2019-04-23 LAB — MAGNESIUM: Magnesium: 2.1 mg/dL (ref 1.7–2.4)

## 2019-04-23 LAB — APTT: aPTT: 32 seconds (ref 24–36)

## 2019-04-23 LAB — HIV ANTIBODY (ROUTINE TESTING W REFLEX): HIV Screen 4th Generation wRfx: NONREACTIVE

## 2019-04-23 LAB — PHOSPHORUS: Phosphorus: 3.5 mg/dL (ref 2.5–4.6)

## 2019-04-23 MED ORDER — NICOTINE 21 MG/24HR TD PT24
21.0000 mg | MEDICATED_PATCH | Freq: Every day | TRANSDERMAL | Status: DC
  Administered 2019-04-23 – 2019-04-26 (×4): 21 mg via TRANSDERMAL
  Filled 2019-04-23 (×5): qty 1

## 2019-04-23 MED ORDER — PHENOL 1.4 % MT LIQD
1.0000 | OROMUCOSAL | Status: DC | PRN
  Administered 2019-04-25: 1 via OROMUCOSAL
  Filled 2019-04-23 (×2): qty 177

## 2019-04-23 MED ORDER — LACTULOSE 10 GM/15ML PO SOLN
10.0000 g | Freq: Two times a day (BID) | ORAL | Status: DC
  Administered 2019-04-23 – 2019-04-26 (×8): 10 g via ORAL
  Filled 2019-04-23 (×9): qty 30

## 2019-04-23 NOTE — Anesthesia Postprocedure Evaluation (Signed)
Anesthesia Post Note  Patient: Timothy Johns  Procedure(s) Performed: HERNIA REPAIR INCISIONAL (N/A )  Patient location during evaluation: PACU Anesthesia Type: General Level of consciousness: awake and alert Pain management: pain level controlled Vital Signs Assessment: post-procedure vital signs reviewed and stable Respiratory status: spontaneous breathing, nonlabored ventilation, respiratory function stable and patient connected to nasal cannula oxygen Cardiovascular status: blood pressure returned to baseline and stable Postop Assessment: no apparent nausea or vomiting Anesthetic complications: no     Last Vitals:  Vitals:   04/23/19 0024 04/23/19 0100  BP: 111/66 108/63  Pulse: 73 67  Resp: 16 14  Temp: 36.8 C   SpO2: 100% 94%    Last Pain:  Vitals:   04/23/19 0055  TempSrc:   PainSc: 9                  Yarixa Lightcap K Asiyah Pineau

## 2019-04-23 NOTE — Progress Notes (Signed)
SURGICAL PROGRESS NOTE   Hospital Day(s): 1.   Post op day(s): 1 Day Post-Op.   Interval History: Patient seen and examined, no acute events or new complaints overnight. Patient reports feeling better than yesterday.  He denies nausea or vomiting.  He denies passing gas.  Pain localized to the wound area.  No pain radiation.  No issues with the wound overnight.  Vital signs in last 24 hours: [min-max] current  Temp:  [97.3 F (36.3 C)-98.4 F (36.9 C)] 97.3 F (36.3 C) (04/05 0517) Pulse Rate:  [55-73] 64 (04/05 0833) Resp:  [14-21] 16 (04/05 0833) BP: (96-111)/(51-66) 101/59 (04/05 0833) SpO2:  [94 %-100 %] 96 % (04/05 0833) Weight:  [77.1 kg] 77.1 kg (04/04 1052)     Height: 5\' 10"  (177.8 cm) Weight: 77.1 kg BMI (Calculated): 24.39   Drain: 140 mL (serosanguineous) since surgery.  Physical Exam:  Constitutional: alert, cooperative and no distress  Respiratory: breathing non-labored at rest  Cardiovascular: regular rate and sinus rhythm  Gastrointestinal: soft, non-tender, and non-distended. Wound is dry and clean.   Labs:  CBC Latest Ref Rng & Units 04/23/2019 04/22/2019 02/14/2019  WBC 4.0 - 10.5 K/uL 7.0 11.9(H) 8.4  Hemoglobin 13.0 - 17.0 g/dL 10.7(L) 11.8(L) 11.3(L)  Hematocrit 39.0 - 52.0 % 31.3(L) 34.2(L) 32.7(L)  Platelets 150 - 400 K/uL 108(L) 126(L) 155   CMP Latest Ref Rng & Units 04/23/2019 04/22/2019 02/14/2019  Glucose 70 - 99 mg/dL 02/16/2019) 631(S) 970(Y)  BUN 6 - 20 mg/dL 637(C) 58(I) 50(Y)  Creatinine 0.61 - 1.24 mg/dL 77(A) 1.28(N) 8.67(E)  Sodium 135 - 145 mmol/L 136 136 136  Potassium 3.5 - 5.1 mmol/L 4.9 4.3 4.6  Chloride 98 - 111 mmol/L 106 104 105  CO2 22 - 32 mmol/L 22 19(L) 23  Calcium 8.9 - 10.3 mg/dL 9.4 9.8 9.7  Total Protein 6.5 - 8.1 g/dL 7.1 7.5 7.0  Total Bilirubin 0.3 - 1.2 mg/dL 2.0(H) 1.4(H) 1.4(H)  Alkaline Phos 38 - 126 U/L 95 132(H) 139(H)  AST 15 - 41 U/L 30 37 26  ALT 0 - 44 U/L 35 43 27    Imaging studies: No new pertinent imaging  studies   Assessment/Plan:  54 y.o. male with strangulated ventral hernia 1 Day Post-Op s/p small bowel resection and hernia repair, complicated by pertinent comorbidities including cirrhosis and hypertension.  Patient recovering well. Still not passing gas. NGT in place due to bowel obstruction pre op and expected ileus after surgery. No setback overnight. No significant ascites drainage. WBC count normal, hemoglobin stable. Small increase in creatinine. Will follow creatinine trend. Will keep NGT in place. Will continue IV abx therapy. Will continue DVT prophylaxis. Encourage patient to ambulate.   40, MD

## 2019-04-23 NOTE — Progress Notes (Signed)
Rec'd pt into PACU. Abd dsg intact. JP drain w/90cc output. VSS NG tube in place and to low suction.  Pt very agitated w/tube in place. Assurance given. Plan to get throat spray on the floor.  Sleeping off and on.

## 2019-04-23 NOTE — Progress Notes (Signed)
Initial Nutrition Assessment  DOCUMENTATION CODES:   Severe malnutrition in context of chronic illness  INTERVENTION:   RD will monitor for diet advancement vs the need for nutrition support  Pt at high refeed risk   NUTRITION DIAGNOSIS:   Severe Malnutrition related to chronic illness(cirrhosis, etoh abuse) as evidenced by moderate to severe fat depletions, moderate to severe muscle depletions.  GOAL:   Patient will meet greater than or equal to 90% of their needs  MONITOR:   Diet advancement, Labs, Weight trends, Skin, I & O's  REASON FOR ASSESSMENT:   Malnutrition Screening Tool    ASSESSMENT:   55 y/o male with h/o etoh abuse, cirrhosis and pancreatitis who is admitted with SBO secondary to ventral hernia now s/p hernia repair wiith small bowel resection 4/4   Met with pt in room today. Pt reports poor appetite and oral intake on his day of admit r/t nausea and abdominal pain. Pt reports that he is generally a good eater at home; pt does not drink supplements at baseline. Per chart, pt is down 100lbs(37%) since July 2020. Pt reports weight loss was related to fluid changes as he reports ascites and multiple paracentesis. Pt reports that his UBW is around 170lbs. Per chart, pt is down 10lbs(6%) over the past 3 months; this is not significant. RD discussed with pt the importance of adequate nutrition needed to preserve lean muscle. Pt with severe muscle depletions on exam today. Pt is willing to drink Ensure supplements once diet advanced. RD will monitor for diet advancement vs the need for nutrition support. JP drain in place with 118m output. NGT in place as well. No BM or flatus yet.   Medications reviewed and include: lovenox, lasix, lactulose, protonix, aldactone, NaCl @75ml /hr, zosyn    Labs reviewed: K 4.9 wnl, P 3.5 wnl, Mg 2.1 wnl, BUN 40(H), creat 1.45(H) Hgb 10.7(L), Hct 31.3(L)  NUTRITION - FOCUSED PHYSICAL EXAM:    Most Recent Value  Orbital Region   Moderate depletion  Upper Arm Region  Severe depletion  Thoracic and Lumbar Region  Moderate depletion  Buccal Region  Moderate depletion  Temple Region  Severe depletion  Clavicle Bone Region  Severe depletion  Clavicle and Acromion Bone Region  Severe depletion  Scapular Bone Region  Moderate depletion  Dorsal Hand  Moderate depletion  Patellar Region  Severe depletion  Anterior Thigh Region  Moderate depletion  Posterior Calf Region  Moderate depletion  Edema (RD Assessment)  None  Hair  Reviewed  Eyes  Reviewed  Mouth  Reviewed  Skin  Reviewed  Nails  Reviewed     Diet Order:   Diet Order            Diet NPO time specified Except for: Sips with Meds  Diet effective now             EDUCATION NEEDS:   Education needs have been addressed  Skin:  Skin Assessment: Reviewed RN Assessment(incison abdomen)  Last BM:  pta  Height:   Ht Readings from Last 1 Encounters:  04/22/19 5' 10"  (1.778 m)    Weight:   Wt Readings from Last 1 Encounters:  04/22/19 77.1 kg    Ideal Body Weight:  75.4 kg  BMI:  Body mass index is 24.39 kg/m.  Estimated Nutritional Needs:   Kcal:  2100-2400kcal/day  Protein:  105-120g/day  Fluid:  2.3L/day  CKoleen DistanceMS, RD, LDN Please refer to [facility] Hospitalfor RD and/or RD on-call/weekend/after hours pager

## 2019-04-24 LAB — COMPREHENSIVE METABOLIC PANEL
ALT: 30 U/L (ref 0–44)
AST: 32 U/L (ref 15–41)
Albumin: 3.8 g/dL (ref 3.5–5.0)
Alkaline Phosphatase: 82 U/L (ref 38–126)
Anion gap: 11 (ref 5–15)
BUN: 42 mg/dL — ABNORMAL HIGH (ref 6–20)
CO2: 21 mmol/L — ABNORMAL LOW (ref 22–32)
Calcium: 9.3 mg/dL (ref 8.9–10.3)
Chloride: 108 mmol/L (ref 98–111)
Creatinine, Ser: 1.36 mg/dL — ABNORMAL HIGH (ref 0.61–1.24)
GFR calc Af Amer: >60 mL/min (ref >60)
GFR calc non Af Amer: 59 mL/min — ABNORMAL LOW (ref >60)
Glucose, Bld: 120 mg/dL — ABNORMAL HIGH (ref 70–99)
Potassium: 4.1 mmol/L (ref 3.5–5.1)
Sodium: 140 mmol/L (ref 135–145)
Total Bilirubin: 1.7 mg/dL — ABNORMAL HIGH (ref 0.3–1.2)
Total Protein: 6.9 g/dL (ref 6.5–8.1)

## 2019-04-24 LAB — CBC
HCT: 31.5 % — ABNORMAL LOW (ref 39.0–52.0)
Hemoglobin: 11 g/dL — ABNORMAL LOW (ref 13.0–17.0)
MCH: 32.9 pg (ref 26.0–34.0)
MCHC: 34.9 g/dL (ref 30.0–36.0)
MCV: 94.3 fL (ref 80.0–100.0)
Platelets: 111 10*3/uL — ABNORMAL LOW (ref 150–400)
RBC: 3.34 MIL/uL — ABNORMAL LOW (ref 4.22–5.81)
RDW: 12.9 % (ref 11.5–15.5)
WBC: 9.3 10*3/uL (ref 4.0–10.5)
nRBC: 0 % (ref 0.0–0.2)

## 2019-04-24 LAB — SURGICAL PATHOLOGY

## 2019-04-24 MED ORDER — SALINE SPRAY 0.65 % NA SOLN
1.0000 | NASAL | Status: DC | PRN
  Administered 2019-04-24: 03:00:00 1 via NASAL
  Filled 2019-04-24: qty 44

## 2019-04-24 MED ORDER — SODIUM CHLORIDE 0.9 % IV SOLN
INTRAVENOUS | Status: DC | PRN
  Administered 2019-04-24 – 2019-04-26 (×2): 250 mL via INTRAVENOUS

## 2019-04-24 NOTE — Progress Notes (Signed)
Patient states he is passing gas and is hoping to get NG tube out in the near future. At this point patient has had a total for 200 out of NG tube on my shift. Will continue to monitor. Safety checks completed and call light placed within reach.

## 2019-04-24 NOTE — Progress Notes (Signed)
SURGICAL PROGRESS NOTE   Hospital Day(s): 2.   Post op day(s): 2 Days Post-Op.   Interval History: Patient seen and examined, no acute events or new complaints overnight. Patient reports feeling better today. Report passing gas. Denies nausea.   Vital signs in last 24 hours: [min-max] current  Temp:  [97.9 F (36.6 C)-98.1 F (36.7 C)] 97.9 F (36.6 C) (04/06 1942) Pulse Rate:  [60-66] 61 (04/06 1942) Resp:  [18-19] 18 (04/06 1942) BP: (99-107)/(56) 99/56 (04/06 1942) SpO2:  [94 %-97 %] 94 % (04/06 1942)     Height: 5\' 10"  (177.8 cm) Weight: 77.1 kg BMI (Calculated): 24.39   NGT: 640 mL   Physical Exam:  Constitutional: alert, cooperative and no distress  Respiratory: breathing non-labored at rest  Cardiovascular: regular rate and sinus rhythm  Gastrointestinal: soft, non-tender, and non-distended. Wound is dry and clean.   Labs:  CBC Latest Ref Rng & Units 04/24/2019 04/23/2019 04/22/2019  WBC 4.0 - 10.5 K/uL 9.3 7.0 11.9(H)  Hemoglobin 13.0 - 17.0 g/dL 11.0(L) 10.7(L) 11.8(L)  Hematocrit 39.0 - 52.0 % 31.5(L) 31.3(L) 34.2(L)  Platelets 150 - 400 K/uL 111(L) 108(L) 126(L)   CMP Latest Ref Rng & Units 04/24/2019 04/23/2019 04/22/2019  Glucose 70 - 99 mg/dL 06/22/2019) 195(K) 932(I)  BUN 6 - 20 mg/dL 712(W) 58(K) 99(I)  Creatinine 0.61 - 1.24 mg/dL 33(A) 2.50(N) 3.97(Q)  Sodium 135 - 145 mmol/L 140 136 136  Potassium 3.5 - 5.1 mmol/L 4.1 4.9 4.3  Chloride 98 - 111 mmol/L 108 106 104  CO2 22 - 32 mmol/L 21(L) 22 19(L)  Calcium 8.9 - 10.3 mg/dL 9.3 9.4 9.8  Total Protein 6.5 - 8.1 g/dL 6.9 7.1 7.5  Total Bilirubin 0.3 - 1.2 mg/dL 7.34(L) 2.0(H) 1.4(H)  Alkaline Phos 38 - 126 U/L 82 95 132(H)  AST 15 - 41 U/L 32 30 37  ALT 0 - 44 U/L 30 35 43    Imaging studies: No new pertinent imaging studies   Assessment/Plan:  54 y.o. male with strangulated ventral hernia 2 Day Post-Op s/p small bowel resection and hernia repair, complicated by pertinent comorbidities including cirrhosis and  hypertension.  Patient recovering slowly. Started passing gas. Abdomen soft. Will clamp NGT and start clear liquid. Will continue IV abx therapy. Encourage patient to ambulate.   40, MD

## 2019-04-25 NOTE — Progress Notes (Signed)
SURGICAL PROGRESS NOTE   Hospital Day(s): 3.   Post op day(s): 3 Days Post-Op.   Interval History: Patient seen and examined, no acute events or new complaints overnight. Patient reports feeling better today.  He reported he continue passing gas constantly.  He reports a small bowel movement.  He denies nausea or vomiting.  He has been with NGT clamped for 24 hours.  He has been drinking clear liquids.  Vital signs in last 24 hours: [min-max] current  Temp:  [97.9 F (36.6 C)-98.5 F (36.9 C)] 98.5 F (36.9 C) (04/07 1455) Pulse Rate:  [60-66] 66 (04/07 1455) Resp:  [16-18] 17 (04/07 1455) BP: (94-112)/(55-59) 102/59 (04/07 1604) SpO2:  [94 %-97 %] 96 % (04/07 1455) Weight:  [75.1 kg] 75.1 kg (04/07 0500)     Height: 5\' 10"  (177.8 cm) Weight: 75.1 kg BMI (Calculated): 23.76   Drain: 300 mL   Physical Exam:  Constitutional: alert, cooperative and no distress  Respiratory: breathing non-labored at rest  Cardiovascular: regular rate and sinus rhythm  Gastrointestinal: soft, non-tender, and non-distended. Wound is dry and clean.   Labs:  CBC Latest Ref Rng & Units 04/24/2019 04/23/2019 04/22/2019  WBC 4.0 - 10.5 K/uL 9.3 7.0 11.9(H)  Hemoglobin 13.0 - 17.0 g/dL 11.0(L) 10.7(L) 11.8(L)  Hematocrit 39.0 - 52.0 % 31.5(L) 31.3(L) 34.2(L)  Platelets 150 - 400 K/uL 111(L) 108(L) 126(L)   CMP Latest Ref Rng & Units 04/24/2019 04/23/2019 04/22/2019  Glucose 70 - 99 mg/dL 06/22/2019) 233(A) 076(A)  BUN 6 - 20 mg/dL 263(F) 35(K) 56(Y)  Creatinine 0.61 - 1.24 mg/dL 56(L) 8.93(T) 3.42(A)  Sodium 135 - 145 mmol/L 140 136 136  Potassium 3.5 - 5.1 mmol/L 4.1 4.9 4.3  Chloride 98 - 111 mmol/L 108 106 104  CO2 22 - 32 mmol/L 21(L) 22 19(L)  Calcium 8.9 - 10.3 mg/dL 9.3 9.4 9.8  Total Protein 6.5 - 8.1 g/dL 6.9 7.1 7.5  Total Bilirubin 0.3 - 1.2 mg/dL 7.68(T) 2.0(H) 1.4(H)  Alkaline Phos 38 - 126 U/L 82 95 132(H)  AST 15 - 41 U/L 32 30 37  ALT 0 - 44 U/L 30 35 43    Imaging studies: No new pertinent  imaging studies   Assessment/Plan:   54 y.o.malewith strangulated ventral hernia3 Day Post-Ops/p small bowel resection and hernia repair, complicated by pertinent comorbidities includingcirrhosis and hypertension.  Recovering adequately. Tolerated clear liquid. No nausea or vomiting.  Continue passing gas.  Had a bowel movement.  Will discontinue NGT and start full liquid diet.  We will continue with current management.  Encourage the patient to ambulate.  54, MD

## 2019-04-26 MED ORDER — AMOXICILLIN-POT CLAVULANATE 875-125 MG PO TABS: 1.0000 | ORAL_TABLET | Freq: Two times a day (BID) | ORAL | 0 refills | Status: AC

## 2019-04-26 MED ORDER — OXYCODONE HCL 5 MG PO TABS: 5.0000 mg | ORAL_TABLET | ORAL | 0 refills | Status: AC | PRN

## 2019-04-26 MED ORDER — SODIUM CHLORIDE 0.9 % IV SOLN: INTRAVENOUS | Status: DC

## 2019-04-26 NOTE — Plan of Care (Signed)
Patient is passing gas and tolerating full liquid diet.  No complaints of pain.  Will continue to monitor.  Arturo Morton

## 2019-04-26 NOTE — Clinical Social Work Note (Addendum)
CSW called [facility] to find out cost for Augmentin and Oxycodone. Pharmacist will check price and call back.  Charlynn Court, CSW [phone]  10:43 am [facility] said price for both medications will be $41.34. CSW notified patient and he said he can afford them. No further concerns. CSW signing off.  Charlynn Court, CSW [phone]

## 2019-04-26 NOTE — Discharge Summary (Signed)
  Patient ID: ANIVAL PASHA MRN: [id number] DOB/AGE: 05-27-1965 54 y.o.  Admit date: 04/22/2019 Discharge date: 04/26/2019   Discharge Diagnoses:  Active Problems:   Ventral hernia with bowel obstruction   Procedures: Incisional hernia repair                        Small bowel resection  Hospital Course: Patient underwent emergent small bowel resection and incisional hernia repair. Patient has been recovering adequately. Pain has been controlled. Patient tolerating soft diet. Patient had bowel movement and is passing gas. Wound dry and clean. Will keep drain in place for another week to avoid any distention due to ascites.   Physical Exam  Constitutional: He is oriented to person, place, and time and well-developed, well-nourished, and in no distress.  Cardiovascular: Normal rate.  Pulmonary/Chest: Effort normal.  Abdominal: Soft. He exhibits no distension. There is no abdominal tenderness. There is no rebound.  Neurological: He is alert and oriented to person, place, and time.  Wound dry and clean.    Consults: None  Disposition: Discharge disposition: 01-Home or Self Care       Discharge Instructions    Diet - low sodium heart healthy   Complete by: As directed    Increase activity slowly   Complete by: As directed      Allergies as of 04/26/2019      Reactions   Tylenol [acetaminophen] Other (See Comments)   Pt. states PCP told him not to take because of his liver.      Medication List    TAKE these medications   amoxicillin-clavulanate 875-125 MG tablet Commonly known as: Augmentin Take 1 tablet by mouth 2 (two) times daily for 10 days.   b complex vitamins tablet Take 1 tablet by mouth daily.   escitalopram 10 MG tablet Commonly known as: LEXAPRO Take 1 tablet (10 mg total) by mouth daily.   furosemide 40 MG tablet Commonly known as: LASIX Take 40 mg by mouth daily.   multivitamin tablet Take 1 tablet by mouth daily.   oxyCODONE 5 MG immediate  release tablet Commonly known as: Oxy IR/ROXICODONE Take 1-2 tablets (5-10 mg total) by mouth every 4 (four) hours as needed for moderate pain.   spironolactone 50 MG tablet Commonly known as: ALDACTONE Take 3 tablets (150 mg total) by mouth daily. What changed: how much to take   thiamine 100 MG tablet Take 100 mg by mouth daily.   Turmeric 400 MG Caps Take 400 mg by mouth daily.      Follow-up Information    Carolan Shiver, MD Follow up in 1 week(s).   Specialty: General Surgery Contact information: [address] [place] [address] [phone]

## 2019-04-26 NOTE — Progress Notes (Signed)
SURGICAL PROGRESS NOTE   Hospital Day(s): 4.   Post op day(s): 4 Days Post-Op.   Interval History: Patient seen and examined, patient initially discharged in the morning but had recurrent significant pain and low blood pressure.  Discharge to continue to improve pain medications and observation for blood pressure.  We will continue to monitor closely  Vital signs in last 24 hours: [min-max] current  Temp:  [97.7 F (36.5 C)-98.3 F (36.8 C)] 97.7 F (36.5 C) (04/08 1229) Pulse Rate:  [58-62] 62 (04/08 1229) Resp:  [14-20] 14 (04/08 1229) BP: (87-90)/(53-57) 87/53 (04/08 1229) SpO2:  [96 %-97 %] 96 % (04/08 1229) Weight:  [75.4 kg] 75.4 kg (04/08 0258)     Height: 5\' 10"  (177.8 cm) Weight: 75.4 kg BMI (Calculated): 23.85   Physical Exam:  Constitutional: alert, cooperative and no distress  Respiratory: breathing non-labored at rest  Cardiovascular: regular rate and sinus rhythm  Gastrointestinal: soft, non-tender, and non-distended.  Wound is dry and clean  Labs:  CBC Latest Ref Rng & Units 04/24/2019 04/23/2019 04/22/2019  WBC 4.0 - 10.5 K/uL 9.3 7.0 11.9(H)  Hemoglobin 13.0 - 17.0 g/dL 11.0(L) 10.7(L) 11.8(L)  Hematocrit 39.0 - 52.0 % 31.5(L) 31.3(L) 34.2(L)  Platelets 150 - 400 K/uL 111(L) 108(L) 126(L)   CMP Latest Ref Rng & Units 04/24/2019 04/23/2019 04/22/2019  Glucose 70 - 99 mg/dL 06/22/2019) 366(Y) 403(K)  BUN 6 - 20 mg/dL 742(V) 95(G) 38(V)  Creatinine 0.61 - 1.24 mg/dL 56(E) 3.32(R) 5.18(A)  Sodium 135 - 145 mmol/L 140 136 136  Potassium 3.5 - 5.1 mmol/L 4.1 4.9 4.3  Chloride 98 - 111 mmol/L 108 106 104  CO2 22 - 32 mmol/L 21(L) 22 19(L)  Calcium 8.9 - 10.3 mg/dL 9.3 9.4 9.8  Total Protein 6.5 - 8.1 g/dL 6.9 7.1 7.5  Total Bilirubin 0.3 - 1.2 mg/dL 4.16(S) 2.0(H) 1.4(H)  Alkaline Phos 38 - 126 U/L 82 95 132(H)  AST 15 - 41 U/L 32 30 37  ALT 0 - 44 U/L 30 35 43    Imaging studies: No new pertinent imaging studies   Assessment/Plan:  54 y.o.malewith strangulated  ventral hernia4Day Post-Ops/p small bowel resection and hernia repair, complicated by pertinent comorbidities includingcirrhosis and hypertension.  Patient recovering slowly.  Due to episode of low blood pressure and significant pain he was cared for separation and overnight.  We will continue with pain management.  Will restart IV fluids for hydration.  Patient is soft diet.  If no deterioration will discharge tomorrow.  53, MD

## 2019-04-26 NOTE — Discharge Instructions (Signed)
  Diet: Resume home heart healthy regular diet.   Activity: No heavy lifting >20 pounds (children, pets, laundry, garbage) or strenuous activity until follow-up, but light activity and walking are encouraged. Do not drive or drink alcohol if taking narcotic pain medications.  Wound care: May shower with soapy water and pat dry (do not rub incisions), but no baths or submerging incision underwater until follow-up. (no swimming)   Medications: Resume all home medications. For mild to moderate pain: acetaminophen (Tylenol) or ibuprofen (if no kidney disease). Combining Tylenol with alcohol can substantially increase your risk of causing liver disease. Narcotic pain medications, if prescribed, can be used for severe pain, though may cause nausea, constipation, and drowsiness. Do not combine Tylenol and Percocet within a 6 hour period as Norco contains Tylenol. If you do not need the narcotic pain medication, you do not need to fill the prescription.  Call office [phone]) at any time if any questions, worsening pain, fevers/chills, bleeding, drainage from incision site, or other concerns.

## 2019-04-27 NOTE — Discharge Summary (Signed)
  Patient ID: Timothy Johns MRN: [id number] DOB/AGE: Sep 06, 1965 54 y.o.  Admit date: 04/22/2019 Discharge date: 04/27/2019   Discharge Diagnoses:  Active Problems:   Ventral hernia with bowel obstruction   Procedures: Incisional hernia repair with small bowel resection and anastomosis  Hospital Course: Patient with strangulated incisional hernia.  He underwent small bowel resection of strangulated small bowel with primary incisional hernia repair.  He tolerated the procedure well.  He has been recovering adequately.  He is tolerating soft diet.  He is having bowel movements and passing gas.  Today the pain is controlled.  Blood pressure with MAP above 65.  He is ambulating.  Wound is dry and clean.  Physical Exam  Constitutional: He is well-developed, well-nourished, and in no distress.  Cardiovascular: Normal rate.  Pulmonary/Chest: Effort normal.  Abdominal: Soft. He exhibits no distension. There is no abdominal tenderness. There is no rebound.  Wound dry and clean.    Consults: None  Disposition: Discharge disposition: 01-Home or Self Care       Discharge Instructions    Diet - low sodium heart healthy   Complete by: As directed    Diet - low sodium heart healthy   Complete by: As directed    Increase activity slowly   Complete by: As directed    Increase activity slowly   Complete by: As directed      Allergies as of 04/27/2019      Reactions   Tylenol [acetaminophen] Other (See Comments)   Pt. states PCP told him not to take because of his liver.      Medication List    TAKE these medications   amoxicillin-clavulanate 875-125 MG tablet Commonly known as: Augmentin Take 1 tablet by mouth 2 (two) times daily for 10 days.   b complex vitamins tablet Take 1 tablet by mouth daily.   escitalopram 10 MG tablet Commonly known as: LEXAPRO Take 1 tablet (10 mg total) by mouth daily.   furosemide 40 MG tablet Commonly known as: LASIX Take 40 mg by mouth  daily.   multivitamin tablet Take 1 tablet by mouth daily.   oxyCODONE 5 MG immediate release tablet Commonly known as: Oxy IR/ROXICODONE Take 1-2 tablets (5-10 mg total) by mouth every 4 (four) hours as needed for moderate pain.   spironolactone 50 MG tablet Commonly known as: ALDACTONE Take 3 tablets (150 mg total) by mouth daily. What changed: how much to take   thiamine 100 MG tablet Take 100 mg by mouth daily.   Turmeric 400 MG Caps Take 400 mg by mouth daily.      Follow-up Information    Carolan Shiver, MD. Go on 05/03/2019.   Specialty: General Surgery Why: 9:45pm Contact information: [address] [place] [address] [phone]

## 2019-04-27 NOTE — Progress Notes (Signed)
Pt discharged per MD order. IV removed. Discharge instructions reviewed with pt. Pt verbalized understanding. All questions answered to pt satisfaction. Pt taken to car in wheelchair.

## 2020-10-03 ENCOUNTER — Other Ambulatory Visit: Payer: Self-pay

## 2020-10-03 ENCOUNTER — Inpatient Hospital Stay: Payer: BC Managed Care – PPO

## 2020-10-03 ENCOUNTER — Inpatient Hospital Stay
Admission: EM | Admit: 2020-10-03 | Discharge: 2020-10-18 | DRG: 433 | Disposition: E | Payer: BC Managed Care – PPO | Attending: Student | Admitting: Student

## 2020-10-03 ENCOUNTER — Emergency Department: Payer: BC Managed Care – PPO

## 2020-10-03 ENCOUNTER — Encounter: Payer: Self-pay | Admitting: Emergency Medicine

## 2020-10-03 DIAGNOSIS — D6959 Other secondary thrombocytopenia: Secondary | ICD-10-CM | POA: Diagnosis present

## 2020-10-03 DIAGNOSIS — D689 Coagulation defect, unspecified: Secondary | ICD-10-CM | POA: Diagnosis present

## 2020-10-03 DIAGNOSIS — Z66 Do not resuscitate: Secondary | ICD-10-CM | POA: Diagnosis not present

## 2020-10-03 DIAGNOSIS — K7031 Alcoholic cirrhosis of liver with ascites: Secondary | ICD-10-CM | POA: Diagnosis present

## 2020-10-03 DIAGNOSIS — D696 Thrombocytopenia, unspecified: Secondary | ICD-10-CM | POA: Diagnosis not present

## 2020-10-03 DIAGNOSIS — E8770 Fluid overload, unspecified: Secondary | ICD-10-CM

## 2020-10-03 DIAGNOSIS — Z8261 Family history of arthritis: Secondary | ICD-10-CM

## 2020-10-03 DIAGNOSIS — F39 Unspecified mood [affective] disorder: Secondary | ICD-10-CM | POA: Diagnosis present

## 2020-10-03 DIAGNOSIS — E872 Acidosis: Secondary | ICD-10-CM | POA: Diagnosis present

## 2020-10-03 DIAGNOSIS — Z809 Family history of malignant neoplasm, unspecified: Secondary | ICD-10-CM

## 2020-10-03 DIAGNOSIS — K920 Hematemesis: Secondary | ICD-10-CM | POA: Diagnosis not present

## 2020-10-03 DIAGNOSIS — Z9114 Patient's other noncompliance with medication regimen: Secondary | ICD-10-CM

## 2020-10-03 DIAGNOSIS — Z8249 Family history of ischemic heart disease and other diseases of the circulatory system: Secondary | ICD-10-CM

## 2020-10-03 DIAGNOSIS — N179 Acute kidney failure, unspecified: Secondary | ICD-10-CM | POA: Diagnosis present

## 2020-10-03 DIAGNOSIS — R748 Abnormal levels of other serum enzymes: Secondary | ICD-10-CM | POA: Diagnosis not present

## 2020-10-03 DIAGNOSIS — Z833 Family history of diabetes mellitus: Secondary | ICD-10-CM

## 2020-10-03 DIAGNOSIS — K766 Portal hypertension: Secondary | ICD-10-CM | POA: Diagnosis present

## 2020-10-03 DIAGNOSIS — F419 Anxiety disorder, unspecified: Secondary | ICD-10-CM | POA: Diagnosis present

## 2020-10-03 DIAGNOSIS — Z79899 Other long term (current) drug therapy: Secondary | ICD-10-CM | POA: Diagnosis not present

## 2020-10-03 DIAGNOSIS — R531 Weakness: Secondary | ICD-10-CM | POA: Diagnosis not present

## 2020-10-03 DIAGNOSIS — F1010 Alcohol abuse, uncomplicated: Secondary | ICD-10-CM | POA: Diagnosis present

## 2020-10-03 DIAGNOSIS — Z7189 Other specified counseling: Secondary | ICD-10-CM

## 2020-10-03 DIAGNOSIS — Z515 Encounter for palliative care: Secondary | ICD-10-CM | POA: Diagnosis not present

## 2020-10-03 DIAGNOSIS — F32A Depression, unspecified: Secondary | ICD-10-CM | POA: Diagnosis present

## 2020-10-03 DIAGNOSIS — Z20822 Contact with and (suspected) exposure to covid-19: Secondary | ICD-10-CM | POA: Diagnosis present

## 2020-10-03 DIAGNOSIS — K7210 Chronic hepatic failure without coma: Secondary | ICD-10-CM | POA: Diagnosis present

## 2020-10-03 DIAGNOSIS — E871 Hypo-osmolality and hyponatremia: Secondary | ICD-10-CM | POA: Diagnosis not present

## 2020-10-03 DIAGNOSIS — N19 Unspecified kidney failure: Secondary | ICD-10-CM | POA: Diagnosis present

## 2020-10-03 DIAGNOSIS — K7200 Acute and subacute hepatic failure without coma: Secondary | ICD-10-CM | POA: Diagnosis not present

## 2020-10-03 DIAGNOSIS — F17290 Nicotine dependence, other tobacco product, uncomplicated: Secondary | ICD-10-CM | POA: Diagnosis present

## 2020-10-03 DIAGNOSIS — R7989 Other specified abnormal findings of blood chemistry: Secondary | ICD-10-CM | POA: Diagnosis not present

## 2020-10-03 DIAGNOSIS — K7290 Hepatic failure, unspecified without coma: Secondary | ICD-10-CM | POA: Diagnosis not present

## 2020-10-03 DIAGNOSIS — Z9049 Acquired absence of other specified parts of digestive tract: Secondary | ICD-10-CM

## 2020-10-03 DIAGNOSIS — E875 Hyperkalemia: Secondary | ICD-10-CM | POA: Diagnosis present

## 2020-10-03 DIAGNOSIS — R899 Unspecified abnormal finding in specimens from other organs, systems and tissues: Secondary | ICD-10-CM

## 2020-10-03 DIAGNOSIS — E7220 Disorder of urea cycle metabolism, unspecified: Secondary | ICD-10-CM | POA: Diagnosis not present

## 2020-10-03 DIAGNOSIS — I10 Essential (primary) hypertension: Secondary | ICD-10-CM | POA: Diagnosis present

## 2020-10-03 DIAGNOSIS — R17 Unspecified jaundice: Secondary | ICD-10-CM | POA: Diagnosis present

## 2020-10-03 DIAGNOSIS — K7682 Hepatic encephalopathy: Secondary | ICD-10-CM | POA: Diagnosis present

## 2020-10-03 DIAGNOSIS — R791 Abnormal coagulation profile: Secondary | ICD-10-CM | POA: Diagnosis not present

## 2020-10-03 DIAGNOSIS — D638 Anemia in other chronic diseases classified elsewhere: Secondary | ICD-10-CM | POA: Diagnosis present

## 2020-10-03 DIAGNOSIS — Z9119 Patient's noncompliance with other medical treatment and regimen: Secondary | ICD-10-CM

## 2020-10-03 DIAGNOSIS — K7040 Alcoholic hepatic failure without coma: Principal | ICD-10-CM | POA: Diagnosis present

## 2020-10-03 DIAGNOSIS — K7469 Other cirrhosis of liver: Secondary | ICD-10-CM | POA: Diagnosis not present

## 2020-10-03 DIAGNOSIS — E8729 Other acidosis: Secondary | ICD-10-CM | POA: Diagnosis present

## 2020-10-03 LAB — PROTIME-INR
INR: 2 — ABNORMAL HIGH (ref 0.8–1.2)
Prothrombin Time: 23 seconds — ABNORMAL HIGH (ref 11.4–15.2)

## 2020-10-03 LAB — COMPREHENSIVE METABOLIC PANEL
ALT: 62 U/L — ABNORMAL HIGH (ref 0–44)
AST: 60 U/L — ABNORMAL HIGH (ref 15–41)
Albumin: 2.2 g/dL — ABNORMAL LOW (ref 3.5–5.0)
Alkaline Phosphatase: 120 U/L (ref 38–126)
Anion gap: 16 — ABNORMAL HIGH (ref 5–15)
BUN: 122 mg/dL — ABNORMAL HIGH (ref 6–20)
CO2: 14 mmol/L — ABNORMAL LOW (ref 22–32)
Calcium: 9.2 mg/dL (ref 8.9–10.3)
Chloride: 103 mmol/L (ref 98–111)
Creatinine, Ser: UNDETERMINED mg/dL (ref 0.61–1.24)
Glucose, Bld: 118 mg/dL — ABNORMAL HIGH (ref 70–99)
Potassium: 4.8 mmol/L (ref 3.5–5.1)
Sodium: 133 mmol/L — ABNORMAL LOW (ref 135–145)
Total Bilirubin: 35.8 mg/dL (ref 0.3–1.2)
Total Protein: 6.6 g/dL (ref 6.5–8.1)

## 2020-10-03 LAB — CBC
HCT: 28.5 % — ABNORMAL LOW (ref 39.0–52.0)
Hemoglobin: 10.6 g/dL — ABNORMAL LOW (ref 13.0–17.0)
MCH: 37.7 pg — ABNORMAL HIGH (ref 26.0–34.0)
MCHC: 37.2 g/dL — ABNORMAL HIGH (ref 30.0–36.0)
MCV: 101.4 fL — ABNORMAL HIGH (ref 80.0–100.0)
Platelets: 93 10*3/uL — ABNORMAL LOW (ref 150–400)
RBC: 2.81 MIL/uL — ABNORMAL LOW (ref 4.22–5.81)
RDW: 19.5 % — ABNORMAL HIGH (ref 11.5–15.5)
WBC: 10.4 10*3/uL (ref 4.0–10.5)
nRBC: 0 % (ref 0.0–0.2)

## 2020-10-03 LAB — RESP PANEL BY RT-PCR (FLU A&B, COVID) ARPGX2
Influenza A by PCR: NEGATIVE
Influenza B by PCR: NEGATIVE
SARS Coronavirus 2 by RT PCR: NEGATIVE

## 2020-10-03 LAB — LIPASE, BLOOD: Lipase: 69 U/L — ABNORMAL HIGH (ref 11–51)

## 2020-10-03 LAB — MAGNESIUM: Magnesium: 3 mg/dL — ABNORMAL HIGH (ref 1.7–2.4)

## 2020-10-03 LAB — BILIRUBIN, DIRECT: Bilirubin, Direct: 15.4 mg/dL — ABNORMAL HIGH (ref 0.0–0.2)

## 2020-10-03 LAB — TSH: TSH: 7.466 u[IU]/mL — ABNORMAL HIGH (ref 0.350–4.500)

## 2020-10-03 MED ORDER — ALBUMIN HUMAN 25 % IV SOLN
25.0000 g | Freq: Once | INTRAVENOUS | Status: AC
  Administered 2020-10-03: 25 g via INTRAVENOUS
  Filled 2020-10-03: qty 100

## 2020-10-03 MED ORDER — FUROSEMIDE 40 MG PO TABS
40.0000 mg | ORAL_TABLET | Freq: Every day | ORAL | Status: DC
  Administered 2020-10-04: 40 mg via ORAL
  Filled 2020-10-03: qty 1

## 2020-10-03 MED ORDER — FUROSEMIDE 10 MG/ML IJ SOLN
INTRAMUSCULAR | Status: AC
  Administered 2020-10-03: 80 mg via INTRAVENOUS
  Filled 2020-10-03: qty 8

## 2020-10-03 MED ORDER — SPIRONOLACTONE 100 MG PO TABS
100.0000 mg | ORAL_TABLET | Freq: Every day | ORAL | Status: DC
  Administered 2020-10-04: 100 mg via ORAL
  Filled 2020-10-03: qty 4
  Filled 2020-10-03 (×2): qty 1

## 2020-10-03 MED ORDER — TRAMADOL HCL 50 MG PO TABS
50.0000 mg | ORAL_TABLET | Freq: Four times a day (QID) | ORAL | Status: DC | PRN
Start: 2020-10-03 — End: 2020-10-05
  Administered 2020-10-03 – 2020-10-05 (×5): 50 mg via ORAL
  Filled 2020-10-03 (×5): qty 1

## 2020-10-03 MED ORDER — MELATONIN 5 MG PO TABS
2.5000 mg | ORAL_TABLET | Freq: Every evening | ORAL | Status: DC | PRN
  Administered 2020-10-03: 2.5 mg via ORAL
  Filled 2020-10-03: qty 1

## 2020-10-03 MED ORDER — PHYTONADIONE 5 MG PO TABS
5.0000 mg | ORAL_TABLET | Freq: Once | ORAL | Status: AC
  Administered 2020-10-03: 5 mg via ORAL
  Filled 2020-10-03: qty 1

## 2020-10-03 MED ORDER — FUROSEMIDE 10 MG/ML IJ SOLN
80.0000 mg | Freq: Once | INTRAMUSCULAR | Status: AC
  Administered 2020-10-03: 80 mg via INTRAVENOUS
  Filled 2020-10-03: qty 8

## 2020-10-03 NOTE — ED Provider Notes (Signed)
  Pt received in signout from Dr. Marisa Severin pending remainder of blood work and CT imaging.  These results with significant liver failure with bilirubin of 35.  CT with ascites without evidence of SBO, herniation.  I reexamined the patient, he is quite jaundiced, but without distress or peritoneal abdominal tenderness.  He reports not taking his Lasix "for a while."  No evidence of SBP or sepsis.  We will provide some albumin and Lasix, and discuss with medicine for admission.   Delton Prairie, MD 10/08/2020 [phone]

## 2020-10-03 NOTE — ED Triage Notes (Signed)
Arrives with C/O jaundiced and abdominal swelling worsening over past 10 days.  Patient with abdominal distention and jaundiced noted.

## 2020-10-03 NOTE — ED Notes (Signed)
Attempted to draw blood, unsuccessful. Phlebotomy called. 

## 2020-10-03 NOTE — ED Notes (Signed)
Pt updated on POC, no other needs at this time, family at bedside

## 2020-10-03 NOTE — Progress Notes (Signed)
Update: per general [facility] subspecialist preference, non-emergent GI consult request sent to Dr. Maximino Greenland via Staff Message to be released at 0600 on 10/04/20.     Newton Pigg, DO Hospitalist

## 2020-10-03 NOTE — ED Notes (Signed)
Unsuccessful with IV access and getting labs.  IV team ordered.

## 2020-10-03 NOTE — ED Provider Notes (Signed)
[facility] Emergency Department Provider Note ____________________________________________   Event Date/Time   First MD Initiated Contact with Patient 10/14/20 1350     (approximate)  I have reviewed the triage vital signs and the nursing notes.   HISTORY  Chief Complaint Jaundice    HPI Timothy Johns is a 55 y.o. male with PMH as noted below including liver cirrhosis, pancreatitis, alcohol abuse, and bowel obstruction who presents with abdominal distention over approximately last week, gradual onset, worsening course, and associated with jaundice over approximately the same period.  The patient reports diffuse abdominal discomfort but no focal pain.  He has constipation alternating with diarrhea.  He reports associated nausea but no vomiting.  He states that this feels similarly to when he has had ascites before requiring paracentesis.  Past Medical History:  Diagnosis Date   Cirrhosis (HCC)    Hypertension     Patient Active Problem List   Diagnosis Date Noted   Ventral hernia with bowel obstruction 04/22/2019   Nonhealing nonsurgical wound limited to breakdown of skin 03/01/2019   Current moderate episode of major depressive disorder without prior episode [facility])    Bleeding ulcer 02/12/2019   Abdominal pain    Abscess    Acute on chronic cholecystitis 05/31/2015   Cholelithiasis 05/31/2015   Leukocytosis 05/31/2015   Hyperbilirubinemia 05/31/2015   Dehydration 05/31/2015   Hypomagnesemia 05/31/2015   Tobacco abuse counseling 05/31/2015   Acute pancreatitis 05/27/2015   History of syncope 05/13/2014   Tobacco use 05/13/2014   Ascites due to alcoholic cirrhosis (HCC) 05/13/2014   Alcoholism (HCC) 05/13/2014   Obesity 05/13/2014   Insomnia 05/13/2014    Past Surgical History:  Procedure Laterality Date   CHOLECYSTECTOMY N/A 05/30/2015   Procedure: LAPAROSCOPIC CHOLECYSTECTOMY WITH INTRAOPERATIVE CHOLANGIOGRAM;  Surgeon: Leafy Ro,  MD;  Location: [facility] ORS;  Service: General;  Laterality: N/A;   INCISIONAL HERNIA REPAIR N/A 04/22/2019   Procedure: HERNIA REPAIR INCISIONAL;  Surgeon: Carolan Shiver, MD;  Location: [facility] ORS;  Service: General;  Laterality: N/A;   PARACENTESIS      Prior to Admission medications   Medication Sig Start Date End Date Taking? Authorizing Provider  furosemide (LASIX) 40 MG tablet Take 40 mg by mouth daily. 01/27/19  Yes [provider]  Multiple Vitamin (MULTIVITAMIN) tablet Take 1 tablet by mouth daily.   Yes [provider]  spironolactone (ALDACTONE) 50 MG tablet Take 3 tablets (150 mg total) by mouth daily. Patient taking differently: Take 100-150 mg by mouth daily. 02/15/19  Yes Danford, Earl Lites, MD  b complex vitamins tablet Take 1 tablet by mouth daily. Patient not taking: Reported on 10/14/2020    [provider]  escitalopram (LEXAPRO) 10 MG tablet Take 1 tablet (10 mg total) by mouth daily. Patient not taking: Reported on 14-Oct-2020 02/15/19   Alberteen Sam, MD  oxyCODONE (OXY IR/ROXICODONE) 5 MG immediate release tablet Take 1-2 tablets (5-10 mg total) by mouth every 4 (four) hours as needed for moderate pain. Patient not taking: No sig reported 04/26/19   Carolan Shiver, MD  thiamine 100 MG tablet Take 100 mg by mouth daily. Patient not taking: Reported on 14-Oct-2020    [provider]  Turmeric 400 MG CAPS Take 400 mg by mouth daily. Patient not taking: Reported on 10-14-20    [provider]    Allergies Tylenol [acetaminophen]  Family History  Problem Relation Age of Onset   Heart disease Mother    Vaginal  cancer Mother    Arthritis Father    Diabetes Brother    Heart disease Maternal Grandfather        dx in her 48s    Social History Social History   Tobacco Use   Smoking status: Former    Packs/day: 0.50    Years: 20.00    Pack years: 10.00    Types: Cigarettes   Smokeless tobacco: Never    Tobacco comments:    1-3 packs a week, he is wearing a patch  Vaping Use   Vaping Use: Every day  Substance Use Topics   Alcohol use: No    Alcohol/week: 0.0 standard drinks    Comment: Hx of alcohol abuse, quit 8 months ago   Drug use: No    Comment: Hx of marijuana use    Review of Systems  Constitutional: No fever. Eyes: No visual changes. ENT: No sore throat. Cardiovascular: Denies chest pain. Respiratory: Denies shortness of breath. Gastrointestinal: No vomiting.  Genitourinary: Negative for dysuria.  Musculoskeletal: Negative for back pain. Skin: Negative for rash. Neurological: Negative for headache.   ____________________________________________   PHYSICAL EXAM:  VITAL SIGNS: ED Triage Vitals  Enc Vitals Group     BP 10/02/2020 1312 (!) 102/40     Pulse Rate 10/01/2020 1312 63     Resp 09/24/2020 1312 20     Temp 09/22/2020 1312 97.8 F (36.6 C)     Temp Source 09/23/2020 1312 Oral     SpO2 10/02/2020 1312 99 %     Weight 10/07/2020 1306 167 lb 12.3 oz (76.1 kg)     Height 10/13/2020 1306 5\' 10"  (1.778 m)     Head Circumference --      Peak Flow --      Pain Score 09/25/2020 1306 7     Pain Loc --      Pain Edu? --      Excl. in GC? --     Constitutional: Alert and oriented.  Somewhat weak appearing but in no acute distress. Eyes: Scleral icterus. Head: Atraumatic. Nose: No congestion/rhinnorhea. Mouth/Throat: Mucous membranes are dry.   Neck: Normal range of motion.  Cardiovascular: Normal rate, regular rhythm. Grossly normal heart sounds.  Good peripheral circulation. Respiratory: Normal respiratory effort.  No retractions. Lungs CTAB. Gastrointestinal: Significant distention.  Soft with no focal tenderness. Genitourinary: No flank tenderness. Musculoskeletal: Extremities warm and well perfused.  Neurologic:  Normal speech and language. No gross focal neurologic deficits are appreciated.  Skin:  Skin is warm and dry. No rash noted. Psychiatric: Calm and  cooperative.  ____________________________________________   LABS (all labs ordered are listed, but only abnormal results are displayed)  Labs Reviewed  CBC - Abnormal; Notable for the following components:      Result Value   RBC 2.81 (*)    Hemoglobin 10.6 (*)    HCT 28.5 (*)    MCV 101.4 (*)    MCH 37.7 (*)    MCHC 37.2 (*)    RDW 19.5 (*)    Platelets 93 (*)    All other components within normal limits  PROTIME-INR - Abnormal; Notable for the following components:   Prothrombin Time 23.0 (*)    INR 2.0 (*)    All other components within normal limits  LIPASE, BLOOD  COMPREHENSIVE METABOLIC PANEL  URINALYSIS, COMPLETE (UACMP) WITH MICROSCOPIC   ____________________________________________  EKG   ____________________________________________  RADIOLOGY  CT abdomen/pelvis: Pending  ____________________________________________   PROCEDURES  Procedure(s) performed: No  Procedures  Critical Care performed: No ____________________________________________   INITIAL IMPRESSION / ASSESSMENT AND PLAN / ED COURSE  Pertinent labs & imaging results that were available during my care of the patient were reviewed by me and considered in my medical decision making (see chart for details).   55 year old male with PMH as noted above including known liver cirrhosis and ascites presents with worsening abdominal distention as well as jaundice over the last week and states this feels similar to when he has had increased ascites requiring paracentesis.  I reviewed the past medical records in Epic.  The patient was most recently admitted in April 2021 with increased abdominal pain and distention although that time had a ventral hernia and bowel obstruction.  Previously he was admitted in early 2021 with ascites requiring paracentesis as well as an abdominal skin ulcer.  On exam today, the patient is overall chronically ill and weak appearing.  His vital signs are normal.   Physical exam is notable for significant jaundice as well as moderate abdominal distention with no focal tenderness.  Overall the abdominal exam is consistent with ascites likely due to the patient's chronic cirrhosis.  The jaundice could be related to liver failure versus less likely acute biliary obstruction.  There is no clinical evidence for SBP given the lack of significant pain or tenderness.  We will obtain a CT of the abdomen/pelvis, basic and hepatobiliary labs, PT/INR to rule out coagulopathy, and reassess.  I anticipate admission for therapeutic paracentesis.  ----------------------------------------- 3:48 PM on 10-08-2020 -----------------------------------------  Chemistry and CT abdomen are pending.  I have signed the patient out to the oncoming ED physician Dr. Katrinka Blazing.  ____________________________________________   FINAL CLINICAL IMPRESSION(S) / ED DIAGNOSES  Final diagnoses:  Ascites due to alcoholic cirrhosis (HCC)  Jaundice      NEW MEDICATIONS STARTED DURING THIS VISIT:  New Prescriptions   No medications on file     Note:  This document was prepared using Dragon voice recognition software and may include unintentional dictation errors.    Dionne Bucy, MD 08-Oct-2020 1549

## 2020-10-03 NOTE — H&P (Signed)
History and Physical    PLEASE NOTE THAT DRAGON DICTATION SOFTWARE WAS USED IN THE CONSTRUCTION OF THIS NOTE.   Timothy Johns:[id number] DOB: Aug 30, 1965 DOA: 10/09/2020  PCP: Jerrol Banana., MD Patient coming from: home   I have personally briefly reviewed patient's old medical records in [place]  Chief Complaint: Abdominal distention  HPI: Timothy Johns is a 55 y.o. male with medical history significant for alcoholic cirrhosis complicated by portal hypertension, who is admitted to [facility] on 10/01/2020 with worsening liver failure after presenting from home to [facility] ED complaining of abdominal distention.   The patient knowledges a history of alcoholic cirrhosis for which she has previously experienced recurrent ascites requiring multiple prior paracenteses, most recent of which occurred in January 2021.  He conveys that his most recent consumption of alcohol occurred in 2020, and reiterates no recent alcohol consumption.  In the setting of his alcoholic cirrhosis, he follows with Dr. Alice Reichert of [facility] gastroenterology, with next follow-up appointment scheduled to occur on 11/12/2020.  In the setting of associated portal hypertension, the patient reports that he is prescribed Lasix 40 mg p.o. daily as well as spironolactone 100 mg po daily.  However, he acknowledges more recent suboptimal compliance with both of these diuretic medications, noting that he independently completely stopped taking both of these medications 2 months ago due to his report that the associated increase in urine output was complicating his daily routines.  He also felt that he experienced worsening depression on the spironolactone, further contributing to his independent discontinuation of this medication 2 months ago.  Subsequently, over the course of the last 7 to 10 days, the patient reports development of worsening abdominal distention as well as new onset jaundice  over that same timeframe.  He conveys that this increase in abdominal distention has not been associate with any overt abdominal pain, and denies any preceding trauma or travel.  He also noted associated new onset "tea coloration" associated with his urine. this prompted the patient to resume his daily home Lasix and spironolactone starting on Monday, 09/29/2020, with the patient noting that he independently increased the prescribed dose of spironolactone from 100 mg p.o. daily to 150 mg p.o. daily in order to expedite associated diuretic influence of this medication.  He notes ensuing increase in urine output along with interval resolution of the aforementioned tea coloration of his urine within the last 1 to 2 days.  Denies any associated dysuria or gross hematuria.  In assessing the patient's recent p.o. intake, he reports that, over the last 2- 3 weeks, that he has increased his daily consumption of water, and believes that he is consuming at least 64 fluid ounces of water on a daily basis over that timeframe.  Denies any recent nausea or vomiting.  He reports a good recent appetite, and no recent decline in intake of food.  Over the last 10 days, he reports alternating constipation with loose stool, noting 1-2 episodes of loose stool every 2 to 3 days in the absence of any associated melena or hematochezia.  Denies any associated subjective fever, chills, rigors, or generalized myalgias.  He reports that his recent increase in abdominal distention has not been associated with any shortness of breath, orthopnea, PND, or worsening of peripheral edema.  Also denies any recent chest pain, palpitations, diaphoresis, dizziness, presyncope, or syncope.  He denies any known history of hepatic encephalopathy, reports that he has not previously been on lactulose.  Over the last week, the patient is also noted new onset generalized weakness in the absence of any associated acute focal weakness, acute focal numbness,  paresthesias, facial droop, slurred speech, expressive aphasia, acute change in vision, dysphagia, vertigo.  No recent headache, neck stiffness, rhinitis, rhinorrhea, sore throat, or rash.  No recent known COVID-19 exposures.  Per chart review, the following prior laboratory studies were of note.  Alkaline phosphatase in January 2021 found to be 175, followed by 225 on 06/11/2020, AST 33 in January 2021, 117 on 06/11/2020, ALT 30 in January 2021, followed by 57 and 06/11/2020, total bilirubin 1.7 in January 2021, 1.7 in April 2021, 3.6 on 06/11/2020.  Additionally, most recent prior INR noted to be 1.2 on 06/11/2020.  Additionally, per chart review, it appears that patient's baseline renal function is in the range of 0.8-1.3, with most recent prior serum creatinine noted to be 0.8 on 06/11/2020.   ED Course:  Vital signs in the ED were notable for the following: Tetramex 97.8; heart rate 63-82; blood pressure 101 over -121/50; respiratory rate 15-20, oxygen saturation 99 to 100% on room air.  Labs were notable for the following: CMP was notable for the following: Sodium 133, potassium 4.8, bicarbonate 14, anion gap 16, BUN 122 compared to most recent prior value of 14 on 06/11/2020, and serum creatinine was unable to be calculated in the setting of degree of elevation of bilirubin.  Today's liver enzymes were notable for the following: Albumin 2.2, alkaline phosphatase 120, AST 60, ALT 62, total bilirubin 35.8.  CBC notable for white blood cell count 10,400, globin 10.6 associated with MCV 101.4, platelet count 93.  INR 2.0.  Urinalysis has been ordered, with result currently pending.  Screening COVID-19/influenza PCR or checked in the ED this evening, with results currently pending.  Imaging and additional notable ED work-up: CT abdomen/pelvis without contrast showed cirrhosis with moderate to large volume abdominal ascites as well as showing a sending colonic wall thickening, felt to be most likely related to  portal hypertension without any evidence of bowel obstruction, abscess, or perforation.  Otherwise, CT abdomen/pelvis showed no evidence of acute intra-abdominal or intra pelvic process, including no evidence of acute pancreatitis.  This imaging confirmed status postcholecystectomy, demonstrated no evidence of biliary duct dilation or choledocholithiasis.  While in the ED, the following were administered: Lasix 80 mg IV x1 as well as albumin 25%, 25 g IV x1.  Subsequently, the patient was admitted for further evaluation and management of worsening liver failure as well as recurrent ascites.      Review of Systems: As per HPI otherwise 10 point review of systems negative.   Past Medical History:  Diagnosis Date   Cirrhosis ([place])    Hypertension     Past Surgical History:  Procedure Laterality Date   CHOLECYSTECTOMY N/A 05/30/2015   Procedure: LAPAROSCOPIC CHOLECYSTECTOMY WITH INTRAOPERATIVE CHOLANGIOGRAM;  Surgeon: Jules Husbands, MD;  Location: [facility] ORS;  Service: General;  Laterality: N/A;   INCISIONAL HERNIA REPAIR N/A 04/22/2019   Procedure: HERNIA REPAIR INCISIONAL;  Surgeon: Herbert Pun, MD;  Location: [facility] ORS;  Service: General;  Laterality: N/A;   PARACENTESIS      Social History:  reports that he has quit smoking. His smoking use included cigarettes. He has a 10.00 pack-year smoking history. He has never used smokeless tobacco. He reports that he does not drink alcohol and does not use drugs.   Allergies  Allergen Reactions   Tylenol [Acetaminophen] Other (See Comments)  Pt. states PCP told him not to take because of his liver.    Family History  Problem Relation Age of Onset   Heart disease Mother    Vaginal cancer Mother    Arthritis Father    Diabetes Brother    Heart disease Maternal Grandfather        dx in her 75s    Family history reviewed and not pertinent    Prior to Admission medications   Medication Sig Start Date End Date Taking?  Authorizing Provider  furosemide (LASIX) 40 MG tablet Take 40 mg by mouth daily. 01/27/19  Yes [provider]  Multiple Vitamin (MULTIVITAMIN) tablet Take 1 tablet by mouth daily.   Yes [provider]  spironolactone (ALDACTONE) 50 MG tablet Take 3 tablets (150 mg total) by mouth daily. Patient taking differently: Take 100-150 mg by mouth daily. 02/15/19  Yes Danford, Suann Larry, MD  b complex vitamins tablet Take 1 tablet by mouth daily. Patient not taking: Reported on 09/27/2020    [provider]  escitalopram (LEXAPRO) 10 MG tablet Take 1 tablet (10 mg total) by mouth daily. Patient not taking: Reported on 10/01/2020 02/15/19   Edwin Dada, MD  oxyCODONE (OXY IR/ROXICODONE) 5 MG immediate release tablet Take 1-2 tablets (5-10 mg total) by mouth every 4 (four) hours as needed for moderate pain. Patient not taking: No sig reported 04/26/19   Herbert Pun, MD  thiamine 100 MG tablet Take 100 mg by mouth daily. Patient not taking: Reported on 09/27/2020    [provider]  Turmeric 400 MG CAPS Take 400 mg by mouth daily. Patient not taking: Reported on 10/09/2020    [provider]     Objective    Physical Exam: Vitals:   10/14/2020 1400 10/15/2020 1502 10/06/2020 1657 09/24/2020 1811  BP: (!) 121/50 (!) 113/56 (!) 105/53 (!) 101/49  Pulse: 73 76 82 78  Resp: _0 Temp:      TempSrc:      SpO2: 100% 100% 100% 100%  Weight:      Height:        General: appears to be stated age; alert, oriented Skin: warm, dry, jaundiced appearance Head:  AT/[place] Mouth:  Oral mucosa membranes appear moist, normal dentition Neck: supple; trachea midline Heart:  RRR; did not appreciate any M/R/G Lungs: CTAB, did not appreciate any wheezes, rales, or rhonchi Abdomen: + BS; soft, moderate distention noted without any associated focal tenderness to palpation.  Additionally, no guarding, rigidity, or rebound tenderness. Vascular: 2+ pedal  pulses b/l; 2+ radial pulses b/l Extremities: Trace edema in bilateral lower extremities, no muscle wasting Neuro: strength and sensation intact in upper and lower extremities b/l    Labs on Admission: I have personally reviewed following labs and imaging studies  CBC: Recent Labs  Lab 09/18/2020 1456  WBC 10.4  HGB 10.6*  HCT 28.5*  MCV 101.4*  PLT 93*   Basic Metabolic Panel: Recent Labs  Lab 10/04/2020 1456  NA 133*  K 4.8  CL 103  CO2 14*  GLUCOSE 118*  BUN 122*  CREATININE UNABLE TO REPORT DUE TO ICTERUS  CALCIUM 9.2   GFR: CrCl cannot be calculated (This lab value cannot be used to calculate CrCl because it is not a number: UNABLE TO REPORT DUE TO ICTERUS). Liver Function Tests: Recent Labs  Lab 10/10/2020 1456  AST 60*  ALT 62*  ALKPHOS 120  BILITOT 35.8*  PROT 6.6  ALBUMIN 2.2*  Recent Labs  Lab 10/06/2020 1456  LIPASE 69*   No results for input(s): AMMONIA in the last 168 hours. Coagulation Profile: Recent Labs  Lab 10/06/2020 1456  INR 2.0*   Cardiac Enzymes: No results for input(s): CKTOTAL, CKMB, CKMBINDEX, TROPONINI in the last 168 hours. BNP (last 3 results) No results for input(s): PROBNP in the last 8760 hours. HbA1C: No results for input(s): HGBA1C in the last 72 hours. CBG: No results for input(s): GLUCAP in the last 168 hours. Lipid Profile: No results for input(s): CHOL, HDL, LDLCALC, TRIG, CHOLHDL, LDLDIRECT in the last 72 hours. Thyroid Function Tests: No results for input(s): TSH, T4TOTAL, FREET4, T3FREE, THYROIDAB in the last 72 hours. Anemia Panel: No results for input(s): VITAMINB12, FOLATE, FERRITIN, TIBC, IRON, RETICCTPCT in the last 72 hours. Urine analysis:    Component Value Date/Time   COLORURINE YELLOW (A) 04/22/2019 1223   APPEARANCEUR CLEAR (A) 04/22/2019 1223   APPEARANCEUR Clear 11/05/2013 0416   LABSPEC 1.010 04/22/2019 1223   LABSPEC 1.005 11/05/2013 0416   PHURINE 5.0 04/22/2019 1223   GLUCOSEU NEGATIVE  04/22/2019 1223   GLUCOSEU Negative 11/05/2013 0416   HGBUR NEGATIVE 04/22/2019 1223   BILIRUBINUR NEGATIVE 04/22/2019 1223   BILIRUBINUR 1+ 11/05/2013 0416   KETONESUR NEGATIVE 04/22/2019 1223   PROTEINUR NEGATIVE 04/22/2019 1223   NITRITE NEGATIVE 04/22/2019 1223   LEUKOCYTESUR NEGATIVE 04/22/2019 1223   LEUKOCYTESUR Negative 11/05/2013 0416    Radiological Exams on Admission: CT ABDOMEN PELVIS WO CONTRAST  Result Date: 10/16/2020 CLINICAL DATA:  Jaundice and abdominal distension over the past 10 days. EXAM: CT ABDOMEN AND PELVIS WITHOUT CONTRAST TECHNIQUE: Multidetector CT imaging of the abdomen and pelvis was performed following the standard protocol without IV contrast. COMPARISON:  04/22/2019 FINDINGS: Lower chest: Right base volume loss with bibasilar subsegmental atelectasis. Mild cardiomegaly, without pericardial or pleural effusion. Hepatobiliary: Advanced cirrhosis. Cholecystectomy, without biliary ductal dilatation. Pancreas: Normal, without mass or ductal dilatation. Spleen: Borderline splenomegaly, 14.0 cm craniocaudal. Adrenals/Urinary Tract: Normal adrenal glands. 5 mm upper pole left renal lesion demonstrates complexity, likely a complex cyst. Normal noncontrast appearance of the right kidney. No hydronephrosis. No hydroureter or ureteric calculi. No bladder calculi. Stomach/Bowel: Normal stomach, without wall thickening. Right-sided colonic wall thickening including on 39/2. Normal terminal ileum and appendix. Normal small bowel. Vascular/Lymphatic: Aortic atherosclerosis. Portal venous hypertension, with portosystemic collaterals which are more apparent on the prior exam. No abdominopelvic adenopathy. Reproductive: Normal prostate. Other: Moderate to large volume abdominopelvic ascites. Bilateral small inguinal hernias. The left-sided hernia contains fat and the right-sided hernia contains fluid. Diffuse mesenteric and omental edema. Reduction or repair of previously described  ventral abdominal wall hernia. Musculoskeletal: Advanced right hip osteoarthritis. 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 IMPRESSION: 1. Cirrhosis and moderate to large volume abdominopelvic ascites. 2. Ascending colonic wall thickening. Most likely related to portal venous hypertension. Infectious colitis cannot be excluded. 3. Right-sided fluid and left-sided fat containing small inguinal hernias. 4.  Aortic Atherosclerosis (ICD10-I70.0). Electronically Signed   By: Abigail Miyamoto M.D.   On: 09/27/2020 16:33      Assessment/Plan   Timothy Johns is a 55 y.o. male with medical history significant for alcoholic cirrhosis complicated by portal hypertension, who is admitted to [facility] on 09/18/2020 with worsening liver failure after presenting from home to [facility] ED complaining of abdominal distention.    Principal Problem:   Liver failure ([place]) Active Problems:   Ascites due to alcoholic cirrhosis (HCC)   Elevated  INR   Generalized weakness   High anion gap metabolic acidosis     #) Exacerbation of liver failure: In the context of a documented history of alcoholic cirrhosis complicated by portal hypertension, the patient presents with 7 to 10 days of worsening abdominal distention with associated new onset jaundice and interval worsening of laboratory findings suggestive of interval decline in hepatic function, including presenting INR 2.0 relative to 1.2 in May 2022 as well as presenting total bilirubin noted to be 35.8 compared to 3.6 in May 2022.  Nonelevated alkaline phosphatase level, which is actually trending down from most recent prior value in May 2022, as a suggestive of an underlying cholestatic process contributing to elevated presenting total bilirubin. CT abdomen/pelvis today confirms moderate to large volume abdominal ascites without any evidence of biliary ductal dilation or choledocholithiasis, further supporting  underlying suspicion of hyperbilirubinemia on the basis of worsening hepatic function.  The patient reports that his most recent alcohol consumption occurred in 2020, and reports [place] no alcohol consumption since that time.  Interval worsening of hepatic function appears to coincide with the patient's acknowledgment of independently discontinuing with his Lasix and spironolactone 2 months ago, as further detailed above.  We will closely monitor ensuing renal function, including for the possibility of hepatorenal syndrome, unable to calculate serum creatinine at this time in the context of degree of elevation of presenting bilirubin, as further detailed above.  will repeat serum creatinine, with possibility of initiating midodrine, octreotide, albumin if suspicion for hepatorenal syndrome depending upon this result.  In the absence of a presenting serum creatinine data point, unable to calculate meld score at this time. Will consult interventional radiology in the morning for therapeutic/diagnostic paracentesis, including assessment for SBP, as further detailed below.   Plan: Add on direct bilirubin.  Recheck CMP in the morning as well as repeat INR at that time, with close following for renal function, as further detailed above..  Add on serum ethanol level as well as urinary drug screen.  Monitor strict I's and O's and daily weights.  Resume home spironolactone 100 mg p.o. daily as well as Lasix 40 mg p.o. daily, maintaining typical 5:2 ratio in the setting of underlying portal hypertension, will closely monitor considering renal function as well as updated serum potassium level via tomorrow morning's labs. Will consult gastroenterology for further recommendations.  Further evaluation and management of presenting ascites, including IR consultation for diagnostic/therapeutic paracentesis, as further detailed below. Will check CXR and BNP, to further evaluate volume status as well as to assess for any  contribution from congestive hepatopathy that could be further assessed via SAAG.       #) Ascites: 7 to 10 days of progressive abdominal distention in the setting of a known history of alcoholic cirrhosis, coinciding with suspected will worsening of hypertension following patient's independent discontinuation of both his Lasix and spironolactone 2 months ago, as further detailed above, with presenting CT abdomen/pelvis confirming moderate to large volume abdominal ascites.  Aside from the sensation of abdominal distention, the patient denies any overt associated abdominal pain, and has no focal tenderness or peritoneal signs on physical exam today.  Additionally, does not appear septic, that is completely without SIRS criteria at this time.  Without overt clinical evidence to suggest underlying SBP at this time.  However, with renal function result currently pending, and increased risk for development of hepatorenal syndrome in the setting SBP, will pursue diagnostic/therapeutic paracentesis via IR consultation in the morning.   Plan: Interventional  radiology consultation for diagnostic/therapeutic paracentesis in the AM, with with associated fluid analysis including cell count with differential in order to assess for SBP, as above.  Close monitoring of ensuing volume of fluid removed via paracentesis for determination of indication administration of ensuing albumin.  Repeat CMP and CBC in the morning.  Further evaluation and management of elevated INR, as detailed below.     #) Coagulopathy: In the setting of interval worsening of hepatic function, presenting INR noted to be 2.0 relative to most recent prior value of 1.2 in May 2022.  No evidence of active bleed at this time.  However, in the setting of plan for paracentesis via IR, will provide vitamin K and closely monitor ensuing INR trend, as detailed below.   Plan: Vitamin K 5 mg p.o. x1 dose.  Repeat INR in the morning.  Check PTT.  Repeat CBC  in the morning.  Formal pharmacologic DVT prophylaxis.  SCDs.      #) Generalized weakness: the patient reports new onset generalized weakness over the course of the last week, in the absence of any evidence of acute focal neurologic deficits, including no evidence of acute focal weakness.  Consequently, acute ischemic CVA is felt to be less likely at this time. Suspect contribution from physiologic stress stemming from presenting presenting exacerbation of underlying liver failure, as further detailed above.  No overt evidence of underlying infectious process at this time, although urinalysis result is currently pending, and will also pursue evaluation of SBP given presenting moderate to large volume ascites, as further detailed above.  Additionally, COVID-19 PCR result remains pending at this time.   Plan: work-up and management of presenting worsening hepatic function, as described above.  Further evaluation management of ascites via diagnostic/therapeutic paracentesis, including assessment for SBP, as above.  Follow-up result urinalysis and COVID-19 screen.  Physical therapy consult has been placed.  Fall precautions.  Check TSH and MMA.  Check ionized calcium.       #) Anion gap metabolic acidosis: Suspected to be on the basis of presenting worsening hepatic function, as further detailed above, including worsening in INR, as quantified above.  We will also closely monitor for ensuing renal function results, with potential worsening renal function also serving as possible contributory factor relative to presenting anion gap metabolic acidosis.  No evidence of underlying infection at this time, no SIRS criteria are met at this time although will follow for results of further evaluation of underlying infectious process, as further detailed below.  In the context of a history of of alcohol abuse, will also add on ethanol level.  Additionally, the patient is a former smoker, will check VBG to further  assess acid-base status, including for evaluation of any element of hypercapnia given this prior smoking history.  Plan: Further evaluation management presenting exacerbation of liver failure, as above.  Follow for results of urinalysis and add on UDS.  Repeat INR in the morning.  Check VBG, as above.  Add on serum ethanol level.  Repeat CMP in the morning as well as CBC at that time.  Follow for result of COVID-19/influenza PCR.       DVT prophylaxis: SCDs Code Status: Full code Family Communication: Case was discussed with the patient's wife, who is present at bedside. Disposition Plan: Per Rounding Team Consults called: will consult GI and IR, as above.;  Admission status: Inpatient     Of note, this patient was added by me to the following Admit List/Treatment Team: armcadmits.  Of note, the Adult Admission Order Set (Multimorbid order set) was used by me in the admission process for this patient.   PLEASE NOTE THAT DRAGON DICTATION SOFTWARE WAS USED IN THE CONSTRUCTION OF THIS NOTE.   [facility] Pager [phone] From [place]  Otherwise, please contact night-coverage  [email or web address] Password [facility]   09/27/2020, 6:23 PM

## 2020-10-03 NOTE — ED Notes (Signed)
Patient transported to CT 

## 2020-10-04 DIAGNOSIS — R7989 Other specified abnormal findings of blood chemistry: Secondary | ICD-10-CM

## 2020-10-04 DIAGNOSIS — K7031 Alcoholic cirrhosis of liver with ascites: Secondary | ICD-10-CM

## 2020-10-04 DIAGNOSIS — D638 Anemia in other chronic diseases classified elsewhere: Secondary | ICD-10-CM

## 2020-10-04 DIAGNOSIS — D696 Thrombocytopenia, unspecified: Secondary | ICD-10-CM

## 2020-10-04 DIAGNOSIS — R791 Abnormal coagulation profile: Secondary | ICD-10-CM | POA: Diagnosis not present

## 2020-10-04 DIAGNOSIS — K7469 Other cirrhosis of liver: Secondary | ICD-10-CM | POA: Diagnosis not present

## 2020-10-04 DIAGNOSIS — K7290 Hepatic failure, unspecified without coma: Secondary | ICD-10-CM | POA: Diagnosis not present

## 2020-10-04 DIAGNOSIS — R748 Abnormal levels of other serum enzymes: Secondary | ICD-10-CM | POA: Diagnosis not present

## 2020-10-04 DIAGNOSIS — K7200 Acute and subacute hepatic failure without coma: Secondary | ICD-10-CM | POA: Diagnosis not present

## 2020-10-04 DIAGNOSIS — E7220 Disorder of urea cycle metabolism, unspecified: Secondary | ICD-10-CM

## 2020-10-04 DIAGNOSIS — D689 Coagulation defect, unspecified: Secondary | ICD-10-CM

## 2020-10-04 DIAGNOSIS — Z7189 Other specified counseling: Secondary | ICD-10-CM

## 2020-10-04 LAB — CBC WITH DIFFERENTIAL/PLATELET
Abs Immature Granulocytes: 0.16 10*3/uL — ABNORMAL HIGH (ref 0.00–0.07)
Basophils Absolute: 0.1 10*3/uL (ref 0.0–0.1)
Basophils Relative: 1 %
Eosinophils Absolute: 0.5 10*3/uL (ref 0.0–0.5)
Eosinophils Relative: 4 %
HCT: 26.2 % — ABNORMAL LOW (ref 39.0–52.0)
Hemoglobin: 9.6 g/dL — ABNORMAL LOW (ref 13.0–17.0)
Immature Granulocytes: 2 %
Lymphocytes Relative: 12 %
Lymphs Abs: 1.2 10*3/uL (ref 0.7–4.0)
MCH: 36.9 pg — ABNORMAL HIGH (ref 26.0–34.0)
MCHC: 36.6 g/dL — ABNORMAL HIGH (ref 30.0–36.0)
MCV: 100.8 fL — ABNORMAL HIGH (ref 80.0–100.0)
Monocytes Absolute: 1.3 10*3/uL — ABNORMAL HIGH (ref 0.1–1.0)
Monocytes Relative: 12 %
Neutro Abs: 7.4 10*3/uL (ref 1.7–7.7)
Neutrophils Relative %: 69 %
Platelets: 81 10*3/uL — ABNORMAL LOW (ref 150–400)
RBC: 2.6 MIL/uL — ABNORMAL LOW (ref 4.22–5.81)
RDW: 19.4 % — ABNORMAL HIGH (ref 11.5–15.5)
WBC: 10.6 10*3/uL — ABNORMAL HIGH (ref 4.0–10.5)
nRBC: 0 % (ref 0.0–0.2)

## 2020-10-04 LAB — COMPREHENSIVE METABOLIC PANEL
ALT: 56 U/L — ABNORMAL HIGH (ref 0–44)
AST: 61 U/L — ABNORMAL HIGH (ref 15–41)
Albumin: 2 g/dL — ABNORMAL LOW (ref 3.5–5.0)
Alkaline Phosphatase: 129 U/L — ABNORMAL HIGH (ref 38–126)
Anion gap: 15 (ref 5–15)
BUN: 123 mg/dL — ABNORMAL HIGH (ref 6–20)
CO2: 13 mmol/L — ABNORMAL LOW (ref 22–32)
Calcium: 8.9 mg/dL (ref 8.9–10.3)
Chloride: 104 mmol/L (ref 98–111)
Creatinine, Ser: UNDETERMINED mg/dL (ref 0.61–1.24)
Glucose, Bld: 94 mg/dL (ref 70–99)
Potassium: 5.5 mmol/L — ABNORMAL HIGH (ref 3.5–5.1)
Sodium: 132 mmol/L — ABNORMAL LOW (ref 135–145)
Total Bilirubin: 29.5 mg/dL (ref 0.3–1.2)
Total Protein: 6.1 g/dL — ABNORMAL LOW (ref 6.5–8.1)

## 2020-10-04 LAB — BLOOD GAS, VENOUS
Acid-base deficit: 10.4 mmol/L — ABNORMAL HIGH (ref 0.0–2.0)
Bicarbonate: 12.7 mmol/L — ABNORMAL LOW (ref 20.0–28.0)
O2 Saturation: 95.7 %
Patient temperature: 37
pCO2, Ven: 22 mmHg — ABNORMAL LOW (ref 44.0–60.0)
pH, Ven: 7.37 (ref 7.250–7.430)
pO2, Ven: 82 mmHg — ABNORMAL HIGH (ref 32.0–45.0)

## 2020-10-04 LAB — HEPATITIS B CORE ANTIBODY, TOTAL: Hep B Core Total Ab: NONREACTIVE

## 2020-10-04 LAB — BILIRUBIN, DIRECT: Bilirubin, Direct: 17.5 mg/dL — ABNORMAL HIGH (ref 0.0–0.2)

## 2020-10-04 LAB — APTT
aPTT: 44 seconds — ABNORMAL HIGH (ref 24–36)
aPTT: 42 seconds — ABNORMAL HIGH (ref 24–36)

## 2020-10-04 LAB — ACETAMINOPHEN LEVEL: Acetaminophen (Tylenol), Serum: <10 ug/mL — ABNORMAL LOW (ref 10–30)

## 2020-10-04 LAB — PROTIME-INR
INR: 2.2 — ABNORMAL HIGH (ref 0.8–1.2)
Prothrombin Time: 24.3 seconds — ABNORMAL HIGH (ref 11.4–15.2)

## 2020-10-04 LAB — LACTATE DEHYDROGENASE: LDH: 143 U/L (ref 98–192)

## 2020-10-04 LAB — ETHANOL: Alcohol, Ethyl (B): <10 mg/dL (ref <10)

## 2020-10-04 LAB — PHOSPHORUS: Phosphorus: UNDETERMINED mg/dL (ref 2.5–4.6)

## 2020-10-04 LAB — FERRITIN: Ferritin: 466 ng/mL — ABNORMAL HIGH (ref 24–336)

## 2020-10-04 LAB — BRAIN NATRIURETIC PEPTIDE: B Natriuretic Peptide: 200.5 pg/mL — ABNORMAL HIGH (ref 0.0–100.0)

## 2020-10-04 LAB — MAGNESIUM: Magnesium: 3.1 mg/dL — ABNORMAL HIGH (ref 1.7–2.4)

## 2020-10-04 LAB — AMMONIA: Ammonia: 152 umol/L — ABNORMAL HIGH (ref 9–35)

## 2020-10-04 LAB — HIV ANTIBODY (ROUTINE TESTING W REFLEX): HIV Screen 4th Generation wRfx: NONREACTIVE

## 2020-10-04 MED ORDER — SODIUM ZIRCONIUM CYCLOSILICATE 10 G PO PACK
10.0000 g | PACK | Freq: Once | ORAL | Status: AC
  Administered 2020-10-04: 10 g via ORAL
  Filled 2020-10-04: qty 1

## 2020-10-04 MED ORDER — ALBUMIN HUMAN 25 % IV SOLN
25.0000 g | Freq: Four times a day (QID) | INTRAVENOUS | Status: DC
  Administered 2020-10-04 – 2020-10-05 (×4): 25 g via INTRAVENOUS
  Filled 2020-10-04 (×7): qty 100

## 2020-10-04 MED ORDER — SODIUM BICARBONATE 8.4 % IV SOLN
INTRAVENOUS | Status: DC
  Filled 2020-10-04 (×2): qty 1000
  Filled 2020-10-04: qty 150
  Filled 2020-10-04: qty 1000
  Filled 2020-10-04: qty 150

## 2020-10-04 MED ORDER — LACTULOSE 10 GM/15ML PO SOLN
30.0000 g | Freq: Three times a day (TID) | ORAL | Status: DC
  Administered 2020-10-04 (×2): 30 g via ORAL
  Filled 2020-10-04 (×2): qty 60

## 2020-10-04 MED ORDER — LACTULOSE ENEMA
300.0000 mL | Freq: Three times a day (TID) | ORAL | Status: DC
  Filled 2020-10-04 (×3): qty 300

## 2020-10-04 NOTE — Evaluation (Signed)
Physical Therapy Evaluation Patient Details Name: Timothy Johns MRN: [id number] DOB: [birth year] Today's Date: 10/04/2020  History of Present Illness  55 y.o. male with medical history significant for alcoholic cirrhosis complicated by portal hypertension, who is admitted to [facility] on 10-11-2020 with worsening liver failure after presenting from home to [facility] ED complaining of abdominal distention.   Clinical Impression  Patient reclining in bed with wife Neva at bedside. Patient with flat affect but appears alert and oriented x 4. History obtained from chart review of OT note completed earlier today. Patient lives alone (wife/pt clarified they currently have a long distance relationship) in a 2 story home with full bathroom upstairs. Prior to hospitalization patient with I with all aspects of care and mobility and has no DME. Upon PT evaluation, patient needed CGA to supervision for bed mobility, transferred with CGA-min A, and ambulated ~ 20 feet with RW and CGA. Pt took longer to process cues and had difficulty with carry over keeping RW in safe position during mobility. Patient demonstrates weakness in B ankle dorsiflexion which affects his gait and increased fall risk. Patient appears to have experienced a significant decline in functional strength and independence and is currently not safe for discharge home. Therefore PT recommends short term PT prior to return home. Patient is presently in need of a RW and 3in1 BSC. Patient would benefit from skilled physical therapy to address  impairments and functional limitations (see PT Problem List below) to work towards stated goals and return to PLOF or maximal functional independence.       Recommendations for follow up therapy are one component of a multi-disciplinary discharge planning process, led by the attending physician.  Recommendations may be updated based on patient status, additional functional criteria and insurance  authorization.  Follow Up Recommendations SNF;Supervision for mobility/OOB    Equipment Recommendations  Rolling walker with 5" wheels;3in1 (PT)    Recommendations for Other Services OT consult     Precautions / Restrictions Precautions Precautions: Fall      Mobility  Bed Mobility Overal bed mobility: Needs Assistance Bed Mobility: Supine to Sit     Supine to sit: Min guard Sit to supine: Supervision   General bed mobility comments: Patient needed increased time and encouragement/cuing to go supine to sit.    Transfers Overall transfer level: Needs assistance Equipment used: Rolling walker (2 wheeled) Transfers: Sit to/from Stand          Lateral/Scoot Transfers: Min assist;Min guard General transfer comment: Patient completed sit <> stand to RW from edge of ED stretcher (elevated surface) and from chair with arms. Progressed from needing min A at first to [facility] for safety.  Ambulation/Gait Ambulation/Gait assistance: Min guard Gait Distance (Feet): 20 Feet Assistive device: Rolling walker (2 wheeled) Gait Pattern/deviations: Trunk flexed;Shuffle Gait velocity: very slow   General Gait Details: Patient ambulated from one side of bed to door and back to other side of bed with RW and CGA. Required cuing for safe body/AD positioning. Allowed RW to get sideways and too far from the body for safety, despite cuing.  Stairs            Wheelchair Mobility    Modified Rankin (Stroke Patients Only)       Balance Overall balance assessment: Needs assistance Sitting-balance support: Feet supported;Bilateral upper extremity supported Sitting balance-Leahy Scale: Good Sitting balance - Comments: able to sit at edge of bed with close SBA   Standing balance support: Bilateral upper extremity  supported;During functional activity Standing balance-Leahy Scale: Poor Standing balance comment: Patient appears dependent on RW for balance during ambulation.                              Pertinent Vitals/Pain Pain Assessment: No/denies pain    Home Living Family/patient expects to be discharged to:: Private residence Living Arrangements: Alone   Type of Home: House Home Access: Stairs to enter Entrance Stairs-Rails: Doctor, general practice of Steps: 3 Home Layout: Two level;Other (Comment) (Bedroom on main level and full bath upstairs) Home Equipment: None      Prior Function Level of Independence: Independent         Comments: Pt lives at home alone. He reports bedroom downstairs and full bath is upstairs and that he "stumbles up there"     Hand Dominance   Dominant Hand: Right    Extremity/Trunk Assessment   Upper Extremity Assessment Upper Extremity Assessment: Generalized weakness    Lower Extremity Assessment Lower Extremity Assessment: Generalized weakness (MMT: B knee extension 4+/5; B ankle dorsiflexion 2/5)    Cervical / Trunk Assessment Cervical / Trunk Assessment: Normal  Communication   Communication: No difficulties  Cognition Arousal/Alertness: Awake/alert Behavior During Therapy: Flat affect Overall Cognitive Status: Impaired/Different from baseline                                 General Comments: Pt requiring increased time and cuing to answer questions      General Comments      Exercises Other Exercises Other Exercises: educated patient/spouse on role of PT in acute care setting, discharge reccomendations.   Assessment/Plan    PT Assessment Patient needs continued PT services  PT Problem List Decreased strength;Decreased knowledge of use of DME;Decreased activity tolerance;Decreased balance;Decreased mobility       PT Treatment Interventions DME instruction;Gait training;Stair training;Functional mobility training;Therapeutic activities;Therapeutic exercise;Manual techniques;Patient/family education;Neuromuscular re-education;Balance training    PT Goals (Current  goals can be found in the Care Plan section)  Acute Rehab PT Goals Patient Stated Goal: to get better PT Goal Formulation: With patient Time For Goal Achievement: 10/18/20 Potential to Achieve Goals: Good    Frequency Min 2X/week   Barriers to discharge Decreased caregiver support;Inaccessible home environment patient lives alone. 14 steps to main bathroom    Co-evaluation               AM-PAC PT "6 Clicks" Mobility  Outcome Measure Help needed turning from your back to your side while in a flat bed without using bedrails?: A Little Help needed moving from lying on your back to sitting on the side of a flat bed without using bedrails?: A Little Help needed moving to and from a bed to a chair (including a wheelchair)?: A Little Help needed standing up from a chair using your arms (e.g., wheelchair or bedside chair)?: A Little Help needed to walk in hospital room?: A Little Help needed climbing 3-5 steps with a railing? : A Lot 6 Click Score: 17    End of Session Equipment Utilized During Treatment: Gait belt Activity Tolerance: Patient limited by fatigue Patient left: in bed;with call bell/phone within reach;with family/visitor present Nurse Communication: Mobility status PT Visit Diagnosis: Unsteadiness on feet (R26.81);Other abnormalities of gait and mobility (R26.89);Muscle weakness (generalized) (M62.81);Difficulty in walking, not elsewhere classified (R26.2)    Time: 5638-7564 PT Time Calculation (min) (ACUTE  ONLY): 26 min   Charges:     PT Treatments $Therapeutic Activity: 8-22 mins        Luretha Murphy. Ilsa Iha, PT, DPT 10/04/20, 3:16 PM

## 2020-10-04 NOTE — Consult Note (Signed)
Vonda Antigua, MD [address] [place], [place], [place], [address] [id number], [place], [place], [place], [address] Phone: [phone]  Fax: [phone]  Consultation  Referring Provider:     Dr. Cyndia Skeeters Primary Care Physician:  Jerrol Banana., MD Reason for Consultation:     Elevated liver enzymes Primary gastroenterologist: [facility]  Date of Admission:  10/10/2020 Date of Consultation:  10/04/2020         HPI:   Timothy Johns is a 55 y.o. male with history of alcoholic liver cirrhosis with ascites, presents with abdominal distention for a week, gradual onset, worsening, associated with jaundice for a week.  Reports abdominal discomfort but no pain.  Associated nausea, but no vomiting.  Patient noted to have acutely elevated liver enzymes on admission and GI was consulted this morning.  H&P reviewed, and patient reports being abstinent of alcohol since 2020.  Previous history of paracentesis in the past as well, and is on diuretics at home, but reports not taking it as prescribed recently, and stopped taking both Lasix and spironolactone completely over the last 2 months at home.  Also reports tea colored urine over the last week, and patient restarted his Lasix and spironolactone due to this 5 days ago, and increased the dose of spironolactone from 100 to 150 mg daily.  Denies any shortness of breath.  No blood in stool.  Reports constipation, no diarrhea.  Total bilirubin acutely elevated to 35.8 on admission.  INR elevated at 2.  CT scan report cirrhosis, large ascites, colonic wall thickening felt to be most likely related to portal hypertension.  No evidence of biliary duct dilatation and choledocholithiasis on CT.  Patient was given Lasix, vitamin K and albumin in the ER  Previous records by [facility] reviewed, last seen by them in September 2020 and several telephone notes are present in his chart after that.  At that time clinic note reports  decompensated alcoholic cirrhosis with ascites, with paracentesis ordered at that time.  They recommended continuing Lasix 40, spironolactone 100 mg daily at that time.  Radiology/imaging records reviewed and show, last known paracentesis in our system was in January 2021 with 2.9 L of peritoneal fluid removed.  He had several paracentesis in 2020, several of them being very large volume.  Patient underwent strangulated incisional hernia repair with small bowel resection and anastomosis in April 2021  Past Medical History:  Diagnosis Date  . Cirrhosis ([place])   . Hypertension     Past Surgical History:  Procedure Laterality Date  . CHOLECYSTECTOMY N/A 05/30/2015   Procedure: LAPAROSCOPIC CHOLECYSTECTOMY WITH INTRAOPERATIVE CHOLANGIOGRAM;  Surgeon: Jules Husbands, MD;  Location: [facility] ORS;  Service: General;  Laterality: N/A;  . INCISIONAL HERNIA REPAIR N/A 04/22/2019   Procedure: HERNIA REPAIR INCISIONAL;  Surgeon: Herbert Pun, MD;  Location: [facility] ORS;  Service: General;  Laterality: N/A;  . PARACENTESIS      Prior to Admission medications   Medication Sig Start Date End Date Taking? Authorizing Provider  furosemide (LASIX) 40 MG tablet Take 40 mg by mouth daily. 01/27/19  Yes [provider]  Multiple Vitamin (MULTIVITAMIN) tablet Take 1 tablet by mouth daily.   Yes [provider]  spironolactone (ALDACTONE) 50 MG tablet Take 3 tablets (150 mg total) by mouth daily. Patient taking differently: Take 100-150 mg by mouth daily. 02/15/19  Yes Danford, Suann Larry, MD  b complex vitamins tablet Take 1 tablet by mouth daily. Patient not  taking: Reported on 09/30/2020    [provider]  escitalopram (LEXAPRO) 10 MG tablet Take 1 tablet (10 mg total) by mouth daily. Patient not taking: Reported on 09/28/2020 02/15/19   Danford, Christopher P, MD  oxyCODONE (OXY IR/ROXICODONE) 5 MG immediate release tablet Take 1-2 tablets (5-10 mg total) by mouth every 4 (four)  hours as needed for moderate pain. Patient not taking: No sig reported 04/26/19   Cintron-Diaz, Edgardo, MD  thiamine 100 MG tablet Take 100 mg by mouth daily. Patient not taking: Reported on 10/09/2020    [provider]  Turmeric 400 MG CAPS Take 400 mg by mouth daily. Patient not taking: Reported on 09/22/2020    [provider]    Family History  Problem Relation Age of Onset  . Heart disease Mother   . Vaginal cancer Mother   . Arthritis Father   . Diabetes Brother   . Heart disease Maternal Grandfather        dx in her 90s     Social History   Tobacco Use  . Smoking status: Former    Packs/day: 0.50    Years: 20.00    Pack years: 10.00    Types: Cigarettes  . Smokeless tobacco: Never  Vaping Use  . Vaping Use: Every day  Substance Use Topics  . Alcohol use: No    Comment: Hx of alcohol abuse, quit  . Drug use: No    Comment: Hx of marijuana use    Allergies as of 10/12/2020 - Review Complete 10/16/2020  Allergen Reaction Noted  . Tylenol [acetaminophen] Other (See Comments) 05/27/2015    Review of Systems:    All systems reviewed and negative except where noted in HPI.   Physical Exam:  Constitutional: General:   Alert,  Well-developed, well-nourished, pleasant and cooperative in NAD BP (!) 108/52 (BP Location: Left Arm)   Pulse 78   Temp 98 F (36.7 C) (Oral)   Resp 18   Ht 5' 10" (1.778 m)   Wt 76.1 kg   SpO2 97%   BMI 24.07 kg/m   Eyes:  Sclera icteric.   Conjunctiva pink. PERRLA  Ears:  No scars, lesions or masses, Normal auditory acuity. Nose:  No deformity, discharge, or lesions. Mouth:  No deformity or lesions, oropharynx pink & moist.  Neck:  Supple; no masses or thyromegaly.  Respiratory: Normal respiratory effort, Normal percussion  Gastrointestinal: Tight abdomen, quite distended, non-tender and non-distended without masses, unable to evaluate for hepatosplenomegaly due to significant ascites  Cardiac: No clubbing or  edema.  No cyanosis. Normal posterior tibial pedal pulses noted.  Lymphatic:  No significant cervical or axillary adenopathy.  Psych:  Alert and cooperative. Normal mood and affect.  Musculoskeletal:  Normal gait. Head normocephalic, atraumatic. Symmetrical without gross deformities. 5/5 Upper and Lower extremity strength bilaterally.  Skin: Warm. Intact without significant lesions or rashes. jaundice present  Neurologic:  Face symmetrical, tongue midline, Normal sensation to touch;  grossly normal neurologically.  Psych:  Alert and oriented x3, Alert and cooperative. Normal mood and affect.   LAB RESULTS: Recent Labs    09/22/2020 1456 10/04/20 0724  WBC 10.4 10.6*  HGB 10.6* 9.6*  HCT 28.5* 26.2*  PLT 93* 81*   BMET Recent Labs     1456 10/04/20 0724  NA 133* 132*  K 4.8 5.5*  CL 103 104  CO2 14* 13*  GLUCOSE 118* 94  BUN 122* 123*  CREATININE UNABLE TO REPORT DUE TO ICTERUS UNABLE   TO REPORT DUE TO ICTERUS  CALCIUM 9.2 8.9   LFT Recent Labs    10/04/20 0724  PROT 6.1*  ALBUMIN 2.0*  AST 61*  ALT 56*  ALKPHOS 129*  BILITOT 29.5*  BILIDIR 17.5*   PT/INR Recent Labs    10/15/2020 1456 10/04/20 0724  LABPROT 23.0* 24.3*  INR 2.0* 2.2*    STUDIES: CT ABDOMEN PELVIS WO CONTRAST  Result Date: 10/11/2020 CLINICAL DATA:  Jaundice and abdominal distension over the past 10 days. EXAM: CT ABDOMEN AND PELVIS WITHOUT CONTRAST TECHNIQUE: Multidetector CT imaging of the abdomen and pelvis was performed following the standard protocol without IV contrast. COMPARISON:  04/22/2019 FINDINGS: Lower chest: Right base volume loss with bibasilar subsegmental atelectasis. Mild cardiomegaly, without pericardial or pleural effusion. Hepatobiliary: Advanced cirrhosis. Cholecystectomy, without biliary ductal dilatation. Pancreas: Normal, without mass or ductal dilatation. Spleen: Borderline splenomegaly, 14.0 cm craniocaudal. Adrenals/Urinary Tract: Normal adrenal glands. 5 mm  upper pole left renal lesion demonstrates complexity, likely a complex cyst. Normal noncontrast appearance of the right kidney. No hydronephrosis. No hydroureter or ureteric calculi. No bladder calculi. Stomach/Bowel: Normal stomach, without wall thickening. Right-sided colonic wall thickening including on 39/2. Normal terminal ileum and appendix. Normal small bowel. Vascular/Lymphatic: Aortic atherosclerosis. Portal venous hypertension, with portosystemic collaterals which are more apparent on the prior exam. No abdominopelvic adenopathy. Reproductive: Normal prostate. Other: Moderate to large volume abdominopelvic ascites. Bilateral small inguinal hernias. The left-sided hernia contains fat and the right-sided hernia contains fluid. Diffuse mesenteric and omental edema. Reduction or repair of previously described ventral abdominal wall hernia. Musculoskeletal: Advanced right hip osteoarthritis. 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 IMPRESSION: 1. Cirrhosis and moderate to large volume abdominopelvic ascites. 2. Ascending colonic wall thickening. Most likely related to portal venous hypertension. Infectious colitis cannot be excluded. 3. Right-sided fluid and left-sided fat containing small inguinal hernias. 4.  Aortic Atherosclerosis (ICD10-I70.0). Electronically Signed   By: Kyle  Talbot M.D.   On: 09/22/2020 16:33   DG Chest Port 1 View  Result Date: 10/10/2020 CLINICAL DATA:  Volume overload, jaundice EXAM: PORTABLE CHEST 1 VIEW COMPARISON:  02/12/2019 FINDINGS: Low lung volumes, with bronchovascular crowding. The heart size and mediastinal contours are within normal limits given technique. Streaky opacities in the right-greater-than-left lung base, likely atelectasis. No pleural effusion or pneumothorax. No acute osseous abnormality. IMPRESSION: Low lung volumes with linear opacities in the lung bases, which are favored to represent atelectasis. Electronically Signed    By: Alison  Vasan M.D.   On: 09/20/2020 21:51      Impression / Plan:   Laithan M Kennard is a 55 y.o. y/o male with history of alcoholic liver cirrhosis, presents with acute decompensation with abdominal distention, ascites, after self discontinuing his home diuretics, with previous history of requiring large-volume paracenteses in 2020  Patient is total bilirubin is acutely elevated.  Alk phos was normal yesterday but is mildly elevated today.  He has had chronic elevation in alk phos in the past  His CT scan was without contrast, and did not show any biliary ductal dilatation  Total bilirubin has improved from yesterday.  Given improving bilirubin, alk phos at baseline, history of cholecystectomy, no CBD dilation on CT scan, this is unlikely to be from biliary obstruction  Decompensation and liver disease likely due to noncompliance with follow-up and medications  Rule out any other etiology of decompensation.  Rule out any infectious causes.  Chest x-ray did not show any pneumonias.  Patient is not having any   diarrhea.  UA is pending  Will check ultrasound liver Doppler Check hepatitis panel, to rule out autoimmune and viral hepatitis  If liver enzymes do not improve as expected or alk phos worsens, may need to consider MRCP  Would prioritize paracentesis with fluid studies at this time  Would recommend albumin infusion with paracentesis as well  Discussed with Dr. Gonfa  Thank you for involving me in the care of this patient.      LOS: 1 day    B , MD  10/04/2020, 9:19 AM     

## 2020-10-04 NOTE — Evaluation (Signed)
Occupational Therapy Evaluation Patient Details Name: Timothy Johns MRN: [id number] DOB: 1965-08-24 Today's Date: 10/04/2020   History of Present Illness 55 y.o. male with medical history significant for alcoholic cirrhosis complicated by portal hypertension, who is admitted to [facility] on 10/04/2020 with worsening liver failure after presenting from home to [facility] ED complaining of abdominal distention.   Clinical Impression   Patient presenting with decreased I in self care, balance, functional mobility/transfers, endurance, and safety awareness. Patient's girlfriend present in the room and reports coming to visit from out of town and upon seeing his condition had him come to ER. Pt lives at home alone and independently but reports "stumbling" upstairs to his shower. Bedroom is downstairs. PTA. Patient does not use AD at baseline. Pt needing min - mod A for bed mobility and declines standing this session. He is able to perform lateral scoots along the bed with assist from therapist. Pt needs increased time and additional cuing to answer questions this session.  Patient will benefit from acute OT to increase overall independence in the areas of ADLs, functional mobility, and safety awareness in order to safely discharge to next venue of care.     Recommendations for follow up therapy are one component of a multi-disciplinary discharge planning process, led by the attending physician.  Recommendations may be updated based on patient status, additional functional criteria and insurance authorization.   Follow Up Recommendations  SNF;Supervision/Assistance - 24 hour    Equipment Recommendations  Other (comment) (defer to next venue of care)       Precautions / Restrictions Precautions Precautions: Fall      Mobility Bed Mobility Overal bed mobility: Needs Assistance Bed Mobility: Supine to Sit;Sit to Supine     Supine to sit: Mod assist Sit to supine: Min  assist   General bed mobility comments: min A trunk support and additional assist to bring R LE forward towards EOB    Transfers Overall transfer level: Needs assistance   Transfers: Lateral/Scoot Transfers          Lateral/Scoot Transfers: Min assist;Mod assist General transfer comment: Pt refusal to stand this session and instead he performs lateral scoots to the L up EOB with assistance from therapist.    Balance Overall balance assessment: Needs assistance Sitting-balance support: Feet supported;Bilateral upper extremity supported Sitting balance-Leahy Scale: Fair Sitting balance - Comments: min guard                                   ADL either performed or assessed with clinical judgement   ADL Overall ADL's : Needs assistance/impaired                                             Vision Patient Visual Report: No change from baseline              Pertinent Vitals/Pain Pain Assessment: Faces Faces Pain Scale: Hurts little more Pain Location: R hip Pain Descriptors / Indicators: Aching;Discomfort Pain Intervention(s): Limited activity within patient's tolerance;Repositioned;Monitored during session     Hand Dominance Right   Extremity/Trunk Assessment Upper Extremity Assessment Upper Extremity Assessment: Generalized weakness   Lower Extremity Assessment Lower Extremity Assessment: Generalized weakness       Communication Communication Communication: No difficulties   Cognition Arousal/Alertness: Awake/alert Behavior  During Therapy: Flat affect Overall Cognitive Status: Impaired/Different from baseline                                 General Comments: Pt requiring increased time and cuing to answer questions              Home Living Family/patient expects to be discharged to:: Private residence Living Arrangements: Alone   Type of Home: House Home Access: Stairs to enter [employer]  of Steps: 3 Entrance Stairs-Rails: Right;Left Home Layout: Two level;Other (Comment) (Bedroom on main level and full bath upstairs) Alternate Level Stairs-Number of Steps: 14   Bathroom Shower/Tub: Tub/shower unit         Home Equipment: None          Prior Functioning/Environment Level of Independence: Independent        Comments: Pt lives at home alone. He reports bedroom downstairs and full bath is upstairs and that he "stumbles up there"        OT Problem List: Decreased strength;Decreased activity tolerance;Impaired balance (sitting and/or standing);Decreased safety awareness;Decreased knowledge of use of DME or AE;Decreased range of motion;Decreased cognition;Pain      OT Treatment/Interventions: Self-care/ADL training;Manual therapy;Therapeutic exercise;Patient/family education;Balance training;Energy conservation;Therapeutic activities;Cognitive remediation/compensation    OT Goals(Current goals can be found in the care plan section) Acute Rehab OT Goals Patient Stated Goal: to get better OT Goal Formulation: With patient Time For Goal Achievement: 10/18/20 Potential to Achieve Goals: Good ADL Goals Pt Will Perform Grooming: with supervision;standing Pt Will Perform Lower Body Dressing: with supervision;sit to/from stand Pt Will Transfer to Toilet: with supervision;ambulating Pt Will Perform Toileting - Clothing Manipulation and hygiene: with supervision;sit to/from stand  OT Frequency: Min 2X/week   Barriers to D/C: Decreased caregiver support  Pt lives alone          AM-PAC OT "6 Clicks" Daily Activity     Outcome Measure Help from another person eating meals?: None Help from another person taking care of personal grooming?: None Help from another person toileting, which includes using toliet, bedpan, or urinal?: A Lot Help from another person bathing (including washing, rinsing, drying)?: A Lot Help from another person to put on and taking off regular  upper body clothing?: A Little Help from another person to put on and taking off regular lower body clothing?: A Lot 6 Click Score: 17   End of Session Nurse Communication: Mobility status;Precautions  Activity Tolerance: Patient limited by fatigue Patient left: in bed;with call bell/phone within reach;with bed alarm set;with family/visitor present  OT Visit Diagnosis: Unsteadiness on feet (R26.81);Repeated falls (R29.6);Muscle weakness (generalized) (M62.81)                Time: 1610-9604 OT Time Calculation (min): 18 min Charges:  OT General Charges $OT Visit: 1 Visit OT Evaluation $OT Eval Moderate Complexity: 1 Mod OT Treatments $Therapeutic Activity: 8-22 mins  Jackquline Denmark, MS, OTR/L , CBIS ascom [phone]  10/04/20, 10:16 AM

## 2020-10-04 NOTE — Progress Notes (Addendum)
PROGRESS NOTE  Timothy Johns QQP:[id number] DOB: 1965-06-18   PCP: Maple Hudson., MD  Patient is from: Home.  Lives with his wife.  Uses walker  DOA: 10/12/2020 LOS: 1  Chief complaints:  Chief Complaint  Patient presents with   Jaundice     Brief Narrative / Interim history: 55 year old M with PMH of alcoholic cirrhosis, portal hypertension, ascites with last paracentesis in March 2021 and noncompliance presenting with jaundice, abdominal distention, tea colored urine and generalized weakness and admitted for decompensated liver failure with moderate to large ascites, coagulopathy, significant hyperbilirubinemia to 36 and azotemia to 122.  Has been noncompliant with his diuretics but recently resumed at higher dose than prescribed without significant improvement in his symptoms, and decided to come to ED.  Denies melena, hematochezia or hematemesis.  GI and IR consulted.    Subjective: Seen and examined earlier this morning.  No major events overnight of this morning.  Reports significant discomfort and difficulty breathing from abdominal pain and distention.  He denies chest pain, fever, chills, melena or hematochezia.  Last bowel movement earlier this morning.  He prefers to remain full code.   Objective: Vitals:   12-Oct-2020 2200 10/04/20 0035 10/04/20 0230 10/04/20 0600  BP: (!) 103/46 (!) 110/51 (!) 113/52 (!) 108/52  Pulse: 71 81 86 78  Resp: (!) 23 (!) 24 (!) 22 18  Temp:    98 F (36.7 C)  TempSrc:    Oral  SpO2: 100% 99% 100% 97%  Weight:      Height:        Intake/Output Summary (Last 24 hours) at 10/04/2020 1252 Last data filed at 10/04/2020 0626 Gross per 24 hour  Intake 100.22 ml  Output 150 ml  Net -49.78 ml   Filed Weights   12-Oct-2020 1306  Weight: 76.1 kg    Examination:  GENERAL: No apparent distress.  Nontoxic. HEENT: MMM.  Sclerae icteric. NECK: Supple.  No apparent JVD.  RESP:  No IWOB.  Fair aeration bilaterally. CVS:  RRR. Heart  sounds normal.  ABD/GI/GU: BS+.  Distended abdomen with ascites.  Mild discomfort but no tenderness.  MSK/EXT:  Moves extremities. No apparent deformity. No edema.  SKIN: Jaundice all over. NEURO: Awake, alert and oriented appropriately.  No apparent focal neuro deficit. PSYCH: Calm. Normal affect.   Procedures:  None  Microbiology summarized: COVID-19 and influenza PCR nonreactive.  Assessment & Plan: Decompensated liver failure likely due to noncompliance with diuretics Alcoholic liver cirrhosis with ascites-reportedly last paracentesis in March 2021. Elevated liver enzymes-likely due to the above Coagulopathy: INR 2.0.  No evidence of bleeding.  Likely due to the above Hyperbilirubinemia: Total bili 36 but improved to 30 Hyperammonia-seems to be mentating okay -Unable to score MELD-Na without creatinine -Low suspicion for SBP clinically. -IR therapeutic and diagnostic paracentesis with fluid studies -Received p.o. vitamin K 5 mg on admission -GI consulted and following. -Continue Lasix and Aldactone -Added lactulose 30 g 3 times daily  Anemia of liver disease: H&H stable.  Denies hematemesis, melena or hematochezia. Recent Labs    2020-10-12 1456 10/04/20 0724  HGB 10.6* 9.6*  -Continue monitoring  Anion gap metabolic acidosis-likely due to azotemia. Azotemia-renal failure?  Unable to calculate creatinine or GFR, likely hyperbilirubinemia interfering -Continue monitoring  Addendum Discussed with nephrology, Dr. Cherylann Ratel who recommended sodium bicarb infusion at 100 cc an hour  Hyperkalemia: K5.5. -P.o. Lokelma 10 g x 1 -Recheck in the morning  Mood disorder -Continue home medication  Thrombocytopenia: Likely  due to liver cirrhosis. Recent Labs  Lab 09/20/2020 1456 10/04/20 0724  PLT 93* 81*  -Continue monitoring  Generalized weakness/physical deconditioning -PT/OT  Goal of care counseling-patient with end-stage liver disease as above.  Very sick.  Overall  prognosis grim.  Still full code.  We discussed pros and cons of CPR and intubation, and his low chance of revival after those heroic intervention with patient and patient's wife at bedside.  He prefers to remain full code. -We will have palliative care involved   Body mass index is 24.07 kg/m.         DVT prophylaxis:  SCDs Start: 10/10/2020 1820  Code Status: Full code Family Communication: Updated patient's wife at bedside Level of care: Progressive Cardiac Status is: Inpatient  Remains inpatient appropriate because:Persistent severe electrolyte disturbances, Ongoing diagnostic testing needed not appropriate for outpatient work up, Unsafe d/c plan, IV treatments appropriate due to intensity of illness or inability to take PO, and Inpatient level of care appropriate due to severity of illness  Dispo: The patient is from: Home              Anticipated d/c is to: SNF              Patient currently is not medically stable to d/c.   Difficult to place patient No       Consultants:  Gastroenterology Interventional radiology   Sch Meds:  Scheduled Meds:  furosemide  40 mg Oral Daily   spironolactone  100 mg Oral Daily   Continuous Infusions:  albumin human     PRN Meds:.melatonin, traMADol  Antimicrobials: Anti-infectives (From admission, onward)    None        I have personally reviewed the following labs and images: CBC: Recent Labs  Lab 09/29/2020 1456 10/04/20 0724  WBC 10.4 10.6*  NEUTROABS  --  7.4  HGB 10.6* 9.6*  HCT 28.5* 26.2*  MCV 101.4* 100.8*  PLT 93* 81*   BMP &GFR Recent Labs  Lab 09/26/2020 1456 10/04/20 0724  NA 133* 132*  K 4.8 5.5*  CL 103 104  CO2 14* 13*  GLUCOSE 118* 94  BUN 122* 123*  CREATININE UNABLE TO REPORT DUE TO ICTERUS UNABLE TO REPORT DUE TO ICTERUS  CALCIUM 9.2 8.9  MG 3.0* 3.1*  PHOS  --  UNABLE TO REPORT DUE TO ICTERUS   CrCl cannot be calculated (This lab value cannot be used to calculate CrCl because it is  not a number: UNABLE TO REPORT DUE TO ICTERUS). Liver & Pancreas: Recent Labs  Lab 09/20/2020 1456 10/04/20 0724  AST 60* 61*  ALT 62* 56*  ALKPHOS 120 129*  BILITOT 35.8* 29.5*  PROT 6.6 6.1*  ALBUMIN 2.2* 2.0*   Recent Labs  Lab 09/28/2020 1456  LIPASE 69*   Recent Labs  Lab 10/04/20 0724  AMMONIA 152*   Diabetic: No results for input(s): HGBA1C in the last 72 hours. No results for input(s): GLUCAP in the last 168 hours. Cardiac Enzymes: No results for input(s): CKTOTAL, CKMB, CKMBINDEX, TROPONINI in the last 168 hours. No results for input(s): PROBNP in the last 8760 hours. Coagulation Profile: Recent Labs  Lab 09/21/2020 1456 10/04/20 0724  INR 2.0* 2.2*   Thyroid Function Tests: Recent Labs    09/23/2020 1456  TSH 7.466*   Lipid Profile: No results for input(s): CHOL, HDL, LDLCALC, TRIG, CHOLHDL, LDLDIRECT in the last 72 hours. Anemia Panel: No results for input(s): VITAMINB12, FOLATE, FERRITIN, TIBC, IRON, RETICCTPCT in the  last 72 hours. Urine analysis:    Component Value Date/Time   COLORURINE YELLOW (A) 04/22/2019 1223   APPEARANCEUR CLEAR (A) 04/22/2019 1223   APPEARANCEUR Clear 11/05/2013 0416   LABSPEC 1.010 04/22/2019 1223   LABSPEC 1.005 11/05/2013 0416   PHURINE 5.0 04/22/2019 1223   GLUCOSEU NEGATIVE 04/22/2019 1223   GLUCOSEU Negative 11/05/2013 0416   HGBUR NEGATIVE 04/22/2019 1223   BILIRUBINUR NEGATIVE 04/22/2019 1223   BILIRUBINUR 1+ 11/05/2013 0416   KETONESUR NEGATIVE 04/22/2019 1223   PROTEINUR NEGATIVE 04/22/2019 1223   NITRITE NEGATIVE 04/22/2019 1223   LEUKOCYTESUR NEGATIVE 04/22/2019 1223   LEUKOCYTESUR Negative 11/05/2013 0416   Sepsis Labs: Invalid input(s): PROCALCITONIN, LACTICIDVEN  Microbiology: Recent Results (from the past 240 hour(s))  Resp Panel by RT-PCR (Flu A&B, Covid) Nasopharyngeal Swab     Status: None   Collection Time: 10/11/2020  9:28 PM   Specimen: Nasopharyngeal Swab; Nasopharyngeal(NP) swabs in vial  transport medium  Result Value Ref Range Status   SARS Coronavirus 2 by RT PCR NEGATIVE NEGATIVE Final    Comment: (NOTE) SARS-CoV-2 target nucleic acids are NOT DETECTED.  The SARS-CoV-2 RNA is generally detectable in upper respiratory specimens during the acute phase of infection. The lowest concentration of SARS-CoV-2 viral copies this assay can detect is 138 copies/mL. A negative result does not preclude SARS-Cov-2 infection and should not be used as the sole basis for treatment or other patient management decisions. A negative result may occur with  improper specimen collection/handling, submission of specimen other than nasopharyngeal swab, presence of viral mutation(s) within the areas targeted by this assay, and inadequate number of viral copies(<138 copies/mL). A negative result must be combined with clinical observations, patient history, and epidemiological information. The expected result is Negative.  Fact Sheet for Patients:  BloggerCourse.com  Fact Sheet for Healthcare Providers:  SeriousBroker.it  This test is no t yet approved or cleared by the [place] FDA and  has been authorized for detection and/or diagnosis of SARS-CoV-2 by FDA under an Emergency Use Authorization (EUA). This EUA will remain  in effect (meaning this test can be used) for the duration of the COVID-19 declaration under Section 564(b)(1) of the Act, 21 U.S.C.section 360bbb-3(b)(1), unless the authorization is terminated  or revoked sooner.       Influenza A by PCR NEGATIVE NEGATIVE Final   Influenza B by PCR NEGATIVE NEGATIVE Final    Comment: (NOTE) The Xpert Xpress SARS-CoV-2/FLU/RSV plus assay is intended as an aid in the diagnosis of influenza from Nasopharyngeal swab specimens and should not be used as a sole basis for treatment. Nasal washings and aspirates are unacceptable for Xpert Xpress SARS-CoV-2/FLU/RSV testing.  Fact  Sheet for Patients: BloggerCourse.com  Fact Sheet for Healthcare Providers: SeriousBroker.it  This test is not yet approved or cleared by the [place] FDA and has been authorized for detection and/or diagnosis of SARS-CoV-2 by FDA under an Emergency Use Authorization (EUA). This EUA will remain in effect (meaning this test can be used) for the duration of the COVID-19 declaration under Section 564(b)(1) of the Act, 21 U.S.C. section 360bbb-3(b)(1), unless the authorization is terminated or revoked.  Performed at [facility], [address] [place], [place] [address]     Radiology Studies: CT ABDOMEN PELVIS WO CONTRAST  Result Date: 10/09/2020 CLINICAL DATA:  Jaundice and abdominal distension over the past 10 days. EXAM: CT ABDOMEN AND PELVIS WITHOUT CONTRAST TECHNIQUE: Multidetector CT imaging of the abdomen and pelvis was performed following the standard protocol  without IV contrast. COMPARISON:  04/22/2019 FINDINGS: Lower chest: Right base volume loss with bibasilar subsegmental atelectasis. Mild cardiomegaly, without pericardial or pleural effusion. Hepatobiliary: Advanced cirrhosis. Cholecystectomy, without biliary ductal dilatation. Pancreas: Normal, without mass or ductal dilatation. Spleen: Borderline splenomegaly, 14.0 cm craniocaudal. Adrenals/Urinary Tract: Normal adrenal glands. 5 mm upper pole left renal lesion demonstrates complexity, likely a complex cyst. Normal noncontrast appearance of the right kidney. No hydronephrosis. No hydroureter or ureteric calculi. No bladder calculi. Stomach/Bowel: Normal stomach, without wall thickening. Right-sided colonic wall thickening including on 39/2. Normal terminal ileum and appendix. Normal small bowel. Vascular/Lymphatic: Aortic atherosclerosis. Portal venous hypertension, with portosystemic collaterals which are more apparent on the prior exam. No abdominopelvic adenopathy.  Reproductive: Normal prostate. Other: Moderate to large volume abdominopelvic ascites. Bilateral small inguinal hernias. The left-sided hernia contains fat and the right-sided hernia contains fluid. Diffuse mesenteric and omental edema. Reduction or repair of previously described ventral abdominal wall hernia. Musculoskeletal: Advanced right hip osteoarthritis. 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 IMPRESSION: 1. Cirrhosis and moderate to large volume abdominopelvic ascites. 2. Ascending colonic wall thickening. Most likely related to portal venous hypertension. Infectious colitis cannot be excluded. 3. Right-sided fluid and left-sided fat containing small inguinal hernias. 4.  Aortic Atherosclerosis (ICD10-I70.0). Electronically Signed   By: Jeronimo Greaves M.D.   On: 10/10/2020 16:33   DG Chest Port 1 View  Result Date: 10/04/2020 CLINICAL DATA:  Volume overload, jaundice EXAM: PORTABLE CHEST 1 VIEW COMPARISON:  02/12/2019 FINDINGS: Low lung volumes, with bronchovascular crowding. The heart size and mediastinal contours are within normal limits given technique. Streaky opacities in the right-greater-than-left lung base, likely atelectasis. No pleural effusion or pneumothorax. No acute osseous abnormality. IMPRESSION: Low lung volumes with linear opacities in the lung bases, which are favored to represent atelectasis. Electronically Signed   By: Wiliam Ke M.D.   On: 10/04/2020 21:51      Melvia Matousek T. Krisandra Bueno [facility]  If 7PM-7AM, please contact night-coverage [email or web address] 10/04/2020, 12:52 PM

## 2020-10-05 DIAGNOSIS — K7200 Acute and subacute hepatic failure without coma: Secondary | ICD-10-CM | POA: Diagnosis not present

## 2020-10-05 DIAGNOSIS — K7290 Hepatic failure, unspecified without coma: Secondary | ICD-10-CM | POA: Diagnosis not present

## 2020-10-05 DIAGNOSIS — R791 Abnormal coagulation profile: Secondary | ICD-10-CM | POA: Diagnosis not present

## 2020-10-05 DIAGNOSIS — K7031 Alcoholic cirrhosis of liver with ascites: Secondary | ICD-10-CM | POA: Diagnosis not present

## 2020-10-05 DIAGNOSIS — E871 Hypo-osmolality and hyponatremia: Secondary | ICD-10-CM

## 2020-10-05 DIAGNOSIS — Z515 Encounter for palliative care: Secondary | ICD-10-CM

## 2020-10-05 LAB — CBC
HCT: 24.6 % — ABNORMAL LOW (ref 39.0–52.0)
Hemoglobin: 9.2 g/dL — ABNORMAL LOW (ref 13.0–17.0)
MCH: 36.7 pg — ABNORMAL HIGH (ref 26.0–34.0)
MCHC: 37.4 g/dL — ABNORMAL HIGH (ref 30.0–36.0)
MCV: 98 fL (ref 80.0–100.0)
Platelets: 72 10*3/uL — ABNORMAL LOW (ref 150–400)
RBC: 2.51 MIL/uL — ABNORMAL LOW (ref 4.22–5.81)
RDW: 19.1 % — ABNORMAL HIGH (ref 11.5–15.5)
WBC: 19.1 10*3/uL — ABNORMAL HIGH (ref 4.0–10.5)
nRBC: 0.1 % (ref 0.0–0.2)

## 2020-10-05 LAB — COMPREHENSIVE METABOLIC PANEL
ALT: 57 U/L — ABNORMAL HIGH (ref 0–44)
AST: 50 U/L — ABNORMAL HIGH (ref 15–41)
Albumin: 2.4 g/dL — ABNORMAL LOW (ref 3.5–5.0)
Alkaline Phosphatase: 116 U/L (ref 38–126)
Anion gap: 16 — ABNORMAL HIGH (ref 5–15)
BUN: 132 mg/dL — ABNORMAL HIGH (ref 6–20)
CO2: 16 mmol/L — ABNORMAL LOW (ref 22–32)
Calcium: 8.7 mg/dL — ABNORMAL LOW (ref 8.9–10.3)
Chloride: 99 mmol/L (ref 98–111)
Glucose, Bld: 139 mg/dL — ABNORMAL HIGH (ref 70–99)
Potassium: 4.9 mmol/L (ref 3.5–5.1)
Sodium: 131 mmol/L — ABNORMAL LOW (ref 135–145)
Total Bilirubin: 29.9 mg/dL (ref 0.3–1.2)
Total Protein: 6.1 g/dL — ABNORMAL LOW (ref 6.5–8.1)

## 2020-10-05 LAB — AMMONIA: Ammonia: 146 umol/L — ABNORMAL HIGH (ref 9–35)

## 2020-10-05 LAB — HEPATITIS B CORE ANTIBODY, IGM: Hep B C IgM: NONREACTIVE

## 2020-10-05 LAB — HEPATITIS C ANTIBODY: HCV Ab: NONREACTIVE

## 2020-10-05 LAB — MAGNESIUM: Magnesium: 2.6 mg/dL — ABNORMAL HIGH (ref 1.7–2.4)

## 2020-10-05 LAB — PROTIME-INR
INR: 2.1 — ABNORMAL HIGH (ref 0.8–1.2)
Prothrombin Time: 23.9 seconds — ABNORMAL HIGH (ref 11.4–15.2)

## 2020-10-05 LAB — PHOSPHORUS

## 2020-10-05 LAB — HEPATITIS A ANTIBODY, TOTAL: hep A Total Ab: REACTIVE — AB

## 2020-10-05 LAB — HEPATITIS A ANTIBODY, IGM: Hep A IgM: NONREACTIVE

## 2020-10-05 LAB — APTT: aPTT: 45 seconds — ABNORMAL HIGH (ref 24–36)

## 2020-10-05 MED ORDER — ONDANSETRON HCL 4 MG/2ML IJ SOLN
4.0000 mg | Freq: Four times a day (QID) | INTRAMUSCULAR | Status: DC | PRN
  Administered 2020-10-05: 4 mg via INTRAVENOUS
  Filled 2020-10-05: qty 2

## 2020-10-05 MED ORDER — HALOPERIDOL LACTATE 5 MG/ML IJ SOLN: 0.5000 mg | INTRAMUSCULAR | Status: DC | PRN

## 2020-10-05 MED ORDER — GLYCOPYRROLATE 1 MG PO TABS
1.0000 mg | ORAL_TABLET | ORAL | Status: DC | PRN
  Filled 2020-10-05: qty 1

## 2020-10-05 MED ORDER — HALOPERIDOL LACTATE 2 MG/ML PO CONC
0.5000 mg | ORAL | Status: DC | PRN
  Filled 2020-10-05: qty 0.3

## 2020-10-05 MED ORDER — LORAZEPAM 2 MG/ML PO CONC
1.0000 mg | ORAL | Status: DC | PRN
  Filled 2020-10-05: qty 0.5

## 2020-10-05 MED ORDER — LORAZEPAM 1 MG PO TABS: 1.0000 mg | ORAL_TABLET | ORAL | Status: DC | PRN

## 2020-10-05 MED ORDER — GLYCOPYRROLATE 0.2 MG/ML IJ SOLN
0.2000 mg | INTRAMUSCULAR | Status: DC | PRN
  Filled 2020-10-05: qty 1

## 2020-10-05 MED ORDER — LORAZEPAM 2 MG/ML IJ SOLN
1.0000 mg | INTRAMUSCULAR | Status: DC | PRN
  Administered 2020-10-05: 1 mg via INTRAVENOUS
  Filled 2020-10-05: qty 1

## 2020-10-05 MED ORDER — ONDANSETRON 4 MG PO TBDP: 4.0000 mg | ORAL_TABLET | Freq: Four times a day (QID) | ORAL | Status: DC | PRN

## 2020-10-05 MED ORDER — POLYVINYL ALCOHOL 1.4 % OP SOLN
1.0000 [drp] | Freq: Four times a day (QID) | OPHTHALMIC | Status: DC | PRN
  Filled 2020-10-05: qty 15

## 2020-10-05 MED ORDER — HALOPERIDOL 0.5 MG PO TABS
0.5000 mg | ORAL_TABLET | ORAL | Status: DC | PRN
  Filled 2020-10-05: qty 1

## 2020-10-05 MED ORDER — SCOPOLAMINE 1 MG/3DAYS TD PT72
1.0000 | MEDICATED_PATCH | TRANSDERMAL | Status: DC
  Filled 2020-10-05 (×2): qty 1

## 2020-10-05 MED ORDER — GLYCOPYRROLATE 0.2 MG/ML IJ SOLN
0.4000 mg | INTRAMUSCULAR | Status: DC | PRN
  Filled 2020-10-05: qty 2

## 2020-10-05 MED ORDER — OXYCODONE HCL 5 MG PO TABS
5.0000 mg | ORAL_TABLET | Freq: Once | ORAL | Status: AC
Start: 2020-10-05 — End: 2020-10-05
  Administered 2020-10-05: 5 mg via ORAL
  Filled 2020-10-05: qty 1

## 2020-10-05 MED ORDER — SODIUM CHLORIDE 0.9 % IV SOLN
1.0000 mg/h | INTRAVENOUS | Status: DC
  Administered 2020-10-05: 0.5 mg/h via INTRAVENOUS
  Filled 2020-10-05: qty 5

## 2020-10-05 MED ORDER — HYDROMORPHONE BOLUS VIA INFUSION
1.0000 mg | INTRAVENOUS | Status: DC | PRN
  Filled 2020-10-05: qty 1

## 2020-10-06 DIAGNOSIS — K7682 Hepatic encephalopathy: Secondary | ICD-10-CM | POA: Diagnosis present

## 2020-10-06 DIAGNOSIS — N19 Unspecified kidney failure: Secondary | ICD-10-CM | POA: Diagnosis present

## 2020-10-06 DIAGNOSIS — D689 Coagulation defect, unspecified: Secondary | ICD-10-CM | POA: Diagnosis present

## 2020-10-06 DIAGNOSIS — Z7189 Other specified counseling: Secondary | ICD-10-CM

## 2020-10-06 DIAGNOSIS — N179 Acute kidney failure, unspecified: Secondary | ICD-10-CM | POA: Diagnosis present

## 2020-10-06 DIAGNOSIS — K7200 Acute and subacute hepatic failure without coma: Secondary | ICD-10-CM | POA: Diagnosis present

## 2020-10-06 DIAGNOSIS — Z515 Encounter for palliative care: Secondary | ICD-10-CM

## 2020-10-06 DIAGNOSIS — Z66 Do not resuscitate: Secondary | ICD-10-CM | POA: Diagnosis present

## 2020-10-06 LAB — ANTI-SMOOTH MUSCLE ANTIBODY, IGG: F-Actin IgG: 11 Units (ref 0–19)

## 2020-10-06 LAB — CALCIUM, IONIZED: Calcium, Ionized, Serum: 4.7 mg/dL (ref 4.5–5.6)

## 2020-10-06 LAB — MITOCHONDRIAL ANTIBODIES: Mitochondrial M2 Ab, IgG: <20 Units (ref 0.0–20.0)

## 2020-10-07 LAB — METHYLMALONIC ACID, SERUM: Methylmalonic Acid, Quantitative: 261 nmol/L (ref 0–378)

## 2020-10-09 LAB — ANTI-MICROSOMAL ANTIBODY LIVER / KIDNEY: LKM1 Ab: 0.8 Units (ref 0.0–20.0)

## 2020-10-18 NOTE — ED Notes (Signed)
Pt provided warm blanket as requested by family

## 2020-10-18 NOTE — Death Summary Note (Signed)
DEATH SUMMARY   Patient Details  Name: Timothy Johns MRN: [id number] DOB: June 20, 1965  Admission/Discharge Information   Admit Date:  2020-10-22  Date of Death: Date of Death: 2020/10/24  Time of Death: Time of Death: 1910  Length of Stay: 2  Referring Physician: Maple Hudson., MD   Reason(s) for Hospitalization  Abdominal pain and distention  Diagnoses  Preliminary cause of death: End stage liver disease (HCC) Secondary Diagnoses (including complications and co-morbidities):  Principal Problem:   End stage liver disease (HCC) Active Problems:   Ascites due to alcoholic cirrhosis (HCC)   Hyperbilirubinemia   Elevated INR   Generalized weakness   High anion gap metabolic acidosis   End of life care   DNR (do not resuscitate)   Goals of care, counseling/discussion   Coagulopathy (HCC)   Acute renal failure (ARF) (HCC)   Acute hepatic encephalopathy   Uremia   Brief Hospital Course (including significant findings, care, treatment, and services provided and events leading to death)  Timothy Johns is a 55 year old M with PMH of alcoholic cirrhosis, portal hypertension, ascites with last paracentesis in March 2021 and noncompliance presenting with jaundice, abdominal distention, tea colored urine and generalized weakness and admitted for decompensated liver failure with moderate to large ascites, coagulopathy, significant hyperbilirubinemia to 36 and azotemia to 122.  Has been noncompliant with his diuretics but recently resumed at higher dose than prescribed without significant improvement in his symptoms, and decided to come to ED.  Denies melena, hematochezia or hematemesis.  GI and IR consulted.    The next day, patient declined quickly with significant mental status change, bleeding from his mouth and rectum.  Remained coagulopathic.  Uremia and ammonia worse.  After extensive discussion about patient's condition, poor prognosis and treatment options with patient's  family including patient's father and sister, we felt emphasis on comfort measures is to the patient's best interest.  We have discussed about use of medications such as Dilaudid, Ativan... to elevate pain, air hunger and anxiety.  End-of-life care with full comfort measures initiated.  I have also notified patient's significant other over the phone, and in person, who is in agreement with the above plan.  Per family, patient has no children.   Patient passed away peacefully on 2020/10/24 at 07:10 P.M. with multiple family members at bedsides.    Pertinent Labs and Studies  Significant Diagnostic Studies CT ABDOMEN PELVIS WO CONTRAST  Result Date: October 22, 2020 CLINICAL DATA:  Jaundice and abdominal distension over the past 10 days. EXAM: CT ABDOMEN AND PELVIS WITHOUT CONTRAST TECHNIQUE: Multidetector CT imaging of the abdomen and pelvis was performed following the standard protocol without IV contrast. COMPARISON:  04/22/2019 FINDINGS: Lower chest: Right base volume loss with bibasilar subsegmental atelectasis. Mild cardiomegaly, without pericardial or pleural effusion. Hepatobiliary: Advanced cirrhosis. Cholecystectomy, without biliary ductal dilatation. Pancreas: Normal, without mass or ductal dilatation. Spleen: Borderline splenomegaly, 14.0 cm craniocaudal. Adrenals/Urinary Tract: Normal adrenal glands. 5 mm upper pole left renal lesion demonstrates complexity, likely a complex cyst. Normal noncontrast appearance of the right kidney. No hydronephrosis. No hydroureter or ureteric calculi. No bladder calculi. Stomach/Bowel: Normal stomach, without wall thickening. Right-sided colonic wall thickening including on 39/2. Normal terminal ileum and appendix. Normal small bowel. Vascular/Lymphatic: Aortic atherosclerosis. Portal venous hypertension, with portosystemic collaterals which are more apparent on the prior exam. No abdominopelvic adenopathy. Reproductive: Normal prostate. Other: Moderate to large volume  abdominopelvic ascites. Bilateral small inguinal hernias. The left-sided hernia contains fat and the right-sided hernia  contains fluid. Diffuse mesenteric and omental edema. Reduction or repair of previously described ventral abdominal wall hernia. Musculoskeletal: Advanced right hip osteoarthritis. 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 IMPRESSION: 1. Cirrhosis and moderate to large volume abdominopelvic ascites. 2. Ascending colonic wall thickening. Most likely related to portal venous hypertension. Infectious colitis cannot be excluded. 3. Right-sided fluid and left-sided fat containing small inguinal hernias. 4.  Aortic Atherosclerosis (ICD10-I70.0). Electronically Signed   By: Jeronimo Greaves M.D.   On: 09/22/2020 16:33   DG Chest Port 1 View  Result Date: 10/17/2020 CLINICAL DATA:  Volume overload, jaundice EXAM: PORTABLE CHEST 1 VIEW COMPARISON:  02/12/2019 FINDINGS: Low lung volumes, with bronchovascular crowding. The heart size and mediastinal contours are within normal limits given technique. Streaky opacities in the right-greater-than-left lung base, likely atelectasis. No pleural effusion or pneumothorax. No acute osseous abnormality. IMPRESSION: Low lung volumes with linear opacities in the lung bases, which are favored to represent atelectasis. Electronically Signed   By: Wiliam Ke M.D.   On: 09/28/2020 21:51    Microbiology Recent Results (from the past 240 hour(s))  Resp Panel by RT-PCR (Flu A&B, Covid) Nasopharyngeal Swab     Status: None   Collection Time: 10/02/2020  9:28 PM   Specimen: Nasopharyngeal Swab; Nasopharyngeal(NP) swabs in vial transport medium  Result Value Ref Range Status   SARS Coronavirus 2 by RT PCR NEGATIVE NEGATIVE Final    Comment: (NOTE) SARS-CoV-2 target nucleic acids are NOT DETECTED.  The SARS-CoV-2 RNA is generally detectable in upper respiratory specimens during the acute phase of infection. The lowest concentration  of SARS-CoV-2 viral copies this assay can detect is 138 copies/mL. A negative result does not preclude SARS-Cov-2 infection and should not be used as the sole basis for treatment or other patient management decisions. A negative result may occur with  improper specimen collection/handling, submission of specimen other than nasopharyngeal swab, presence of viral mutation(s) within the areas targeted by this assay, and inadequate number of viral copies(<138 copies/mL). A negative result must be combined with clinical observations, patient history, and epidemiological information. The expected result is Negative.  Fact Sheet for Patients:  BloggerCourse.com  Fact Sheet for Healthcare Providers:  SeriousBroker.it  This test is no t yet approved or cleared by the [place] FDA and  has been authorized for detection and/or diagnosis of SARS-CoV-2 by FDA under an Emergency Use Authorization (EUA). This EUA will remain  in effect (meaning this test can be used) for the duration of the COVID-19 declaration under Section 564(b)(1) of the Act, 21 U.S.C.section 360bbb-3(b)(1), unless the authorization is terminated  or revoked sooner.       Influenza A by PCR NEGATIVE NEGATIVE Final   Influenza B by PCR NEGATIVE NEGATIVE Final    Comment: (NOTE) The Xpert Xpress SARS-CoV-2/FLU/RSV plus assay is intended as an aid in the diagnosis of influenza from Nasopharyngeal swab specimens and should not be used as a sole basis for treatment. Nasal washings and aspirates are unacceptable for Xpert Xpress SARS-CoV-2/FLU/RSV testing.  Fact Sheet for Patients: BloggerCourse.com  Fact Sheet for Healthcare Providers: SeriousBroker.it  This test is not yet approved or cleared by the [place] FDA and has been authorized for detection and/or diagnosis of SARS-CoV-2 by FDA under an Emergency Use  Authorization (EUA). This EUA will remain in effect (meaning this test can be used) for the duration of the COVID-19 declaration under Section 564(b)(1) of the Act, 21 U.S.C. section 360bbb-3(b)(1), unless the authorization is  terminated or revoked.  Performed at [facility], [address] [place], [place] [address]     Lab Basic Metabolic Panel: Recent Labs  Lab Oct 22, 2020 1456 10/04/20 0724 09/20/2020 0722  NA 133* 132* 131*  K 4.8 5.5* 4.9  CL 103 104 99  CO2 14* 13* 16*  GLUCOSE 118* 94 139*  BUN 122* 123* 132*  CREATININE UNABLE TO REPORT DUE TO ICTERUS UNABLE TO REPORT DUE TO ICTERUS NOT CALCULATED  CALCIUM 9.2 8.9 8.7*  MG 3.0* 3.1* 2.6*  PHOS  --  UNABLE TO REPORT DUE TO ICTERUS NOT CALCULATED   Liver Function Tests: Recent Labs  Lab 10-22-20 1456 10/04/20 0724 10/14/2020 0722  AST 60* 61* 50*  ALT 62* 56* 57*  ALKPHOS 120 129* 116  BILITOT 35.8* 29.5* 29.9*  PROT 6.6 6.1* 6.1*  ALBUMIN 2.2* 2.0* 2.4*   Recent Labs  Lab 2020/10/22 1456  LIPASE 69*   Recent Labs  Lab 10/04/20 0724 09/23/2020 0722  AMMONIA 152* 146*   CBC: Recent Labs  Lab 10/22/2020 1456 10/04/20 0724 09/29/2020 0722  WBC 10.4 10.6* 19.1*  NEUTROABS  --  7.4  --   HGB 10.6* 9.6* 9.2*  HCT 28.5* 26.2* 24.6*  MCV 101.4* 100.8* 98.0  PLT 93* 81* 72*   Cardiac Enzymes: No results for input(s): CKTOTAL, CKMB, CKMBINDEX, TROPONINI in the last 168 hours. Sepsis Labs: Recent Labs  Lab 22-Oct-2020 1456 10/04/20 0724 10/17/2020 0722  WBC 10.4 10.6* 19.1*    Procedures/Operations     Kiauna Zywicki T Insiya Oshea 10/06/2020, 10:14 PM

## 2020-10-18 NOTE — ED Notes (Signed)
Family to nurses desk requesting to speak to MD. MD Jonette Mate. Also notified of pt BP 83/47 with MAP 58 and dilaudid drip started

## 2020-10-18 NOTE — Progress Notes (Signed)
PROGRESS NOTE  Timothy Johns FKC:[id number] DOB: December 12, 1965   PCP: Maple Hudson., MD  Patient is from: Home.  Lives with his wife.  Uses walker  DOA: 10/08/2020 LOS: 2  Chief complaints:  Chief Complaint  Patient presents with   Jaundice     Brief Narrative / Interim history: 55 year old M with PMH of alcoholic cirrhosis, portal hypertension, ascites with last paracentesis in March 2021 and noncompliance presenting with jaundice, abdominal distention, tea colored urine and generalized weakness and admitted for decompensated liver failure with moderate to large ascites, coagulopathy, significant hyperbilirubinemia to 36 and azotemia to 122.  Has been noncompliant with his diuretics but recently resumed at higher dose than prescribed without significant improvement in his symptoms, and decided to come to ED.  Denies melena, hematochezia or hematemesis.  GI and IR consulted.   The next day, patient declined quickly with significant mental status change, bleeding from his mouth and rectum.  Remained coagulopathic.  Uremia and ammonia worse.  After extensive discussion about patient's condition, poor prognosis and treatment options with patient's family including patient's father and sister, we felt emphasis on comfort measures is to the patient's best interest.  We have discussed about use of medications such as Dilaudid, Ativan... to elevate pain, air hunger and anxiety.  End-of-life care with full comfort measures initiated.  I have also notified patient's significant other over the phone who is in agreement with the above plan.  Per family, patient has no children.    Subjective: Seen and examined earlier this morning.  Patient is groggy but responds to his name.  Not able to provide history.  Patient's father and sister at bedside.  Goal of care discussion as above.  Objective: Vitals:   27-Oct-2020 0200 27-Oct-2020 0400 10-27-2020 0800 October 27, 2020 1115  BP: (!) 100/52 (!) 118/58 (!)  95/51 (!) 107/44  Pulse: 71 77 79 81  Resp: 17 17 15  (!) 24  Temp:      TempSrc:      SpO2: 97% 97% 97% 95%  Weight:      Height:        Intake/Output Summary (Last 24 hours) at 10/27/2020 1304 Last data filed at Oct 27, 2020 0400 Gross per 24 hour  Intake 1457.78 ml  Output --  Net 1457.78 ml   Filed Weights   09/25/2020 1306  Weight: 76.1 kg    Examination:  GENERAL: Uncomfortable.  HEENT: Dark dry blood on oral mucosa.  Small dried blood in left ear. RESP: On RA.  No IWOB.  Fair aeration bilaterally. CVS:  RRR. Heart sounds normal.  ABD/GI/GU: Abdomen distended. MSK/EXT:  Moves extremities. No apparent deformity.  1+ edema bilaterally SKIN: Skin jaundice all over NEURO: Groggy and sleepy.  Responds to his name.  Does not follows command. PSYCH: Looks uncomfortable.   Procedures:  None  Microbiology summarized: COVID-19 and influenza PCR nonreactive.  Assessment & Plan: End-of-life care/comfort measures only/DNR/DNI-see goal of care discussion as above -Discontinued labs, nonessential meds and monitors -Full comfort measures initiated with continuous and as needed IV Dilaudid -Ativan for anxiety and air hunger -Glycopyrrolate for secretion -Anticipate in hospital death   Decompensated liver failure likely due to noncompliance with diuretics Alcoholic liver cirrhosis with ascites-reportedly last paracentesis in March 2021. Elevated liver enzymes-likely due to the above Coagulopathy/upper and lower GI hemorrhage Hyperbilirubinemia: Total bili 30. Hepatic encephalopathy-worse.  Ammonia up to 146 -Unable to score MELD-Na without creatinine   Anemia of liver disease: Started some hematemesis and lower GI  bleed this morning.  Anion gap metabolic acidosis-likely due to azotemia. Azotemia-renal failure?  Unable to calculate creatinine or GFR, likely hyperbilirubinemia interfering Hyponatremia Hyperkalemia: Resolved  Mood disorder  Thrombocytopenia: Likely due to  liver cirrhosis. Recent Labs  Lab 10/09/2020 1456 10/04/20 0724 10/17/2020 0722  PLT 93* 81* 72*   Generalized weakness/physical deconditioning  Goal of care counseling-see goal of care discussion above  Leukocytosis/bandemia  Body mass index is 24.07 kg/m.         DVT prophylaxis:    Code Status: Full code Family Communication: Updated patient's father, sister, significant other and other extended family members Level of care: Med-Surg Status is: Inpatient  Remains inpatient appropriate because: Full comfort care.  Anticipate in hospital death  Dispo: The patient is from: Home              Anticipated d/c is to:  Anticipate in hospital death              Patient currently is not medically stable to d/c.   Difficult to place patient No       Consultants:  Gastroenterology Interventional radiology   Sch Meds:  Scheduled Meds:   Continuous Infusions:  HYDROmorphone 0.5 mg/hr (10-17-20 1140)   PRN Meds:.glycopyrrolate **OR** glycopyrrolate **OR** glycopyrrolate, haloperidol **OR** haloperidol **OR** haloperidol lactate, HYDROmorphone, LORazepam **OR** LORazepam **OR** LORazepam, ondansetron **OR** ondansetron (ZOFRAN) IV, polyvinyl alcohol  Antimicrobials: Anti-infectives (From admission, onward)    None        I have personally reviewed the following labs and images: CBC: Recent Labs  Lab 09/22/2020 1456 10/04/20 0724 10-17-2020 0722  WBC 10.4 10.6* 19.1*  NEUTROABS  --  7.4  --   HGB 10.6* 9.6* 9.2*  HCT 28.5* 26.2* 24.6*  MCV 101.4* 100.8* 98.0  PLT 93* 81* 72*   BMP &GFR Recent Labs  Lab 10/17/2020 1456 10/04/20 0724 10-17-2020 0722  NA 133* 132* 131*  K 4.8 5.5* 4.9  CL 103 104 99  CO2 14* 13* 16*  GLUCOSE 118* 94 139*  BUN 122* 123* 132*  CREATININE UNABLE TO REPORT DUE TO ICTERUS UNABLE TO REPORT DUE TO ICTERUS NOT CALCULATED  CALCIUM 9.2 8.9 8.7*  MG 3.0* 3.1* 2.6*  PHOS  --  UNABLE TO REPORT DUE TO ICTERUS NOT CALCULATED   CrCl  cannot be calculated (This lab value cannot be used to calculate CrCl because it is not a number: NOT CALCULATED). Liver & Pancreas: Recent Labs  Lab 10/13/2020 1456 10/04/20 0724 10/17/2020 0722  AST 60* 61* 50*  ALT 62* 56* 57*  ALKPHOS 120 129* 116  BILITOT 35.8* 29.5* 29.9*  PROT 6.6 6.1* 6.1*  ALBUMIN 2.2* 2.0* 2.4*   Recent Labs  Lab 10/13/2020 1456  LIPASE 69*   Recent Labs  Lab 10/04/20 0724 Oct 17, 2020 0722  AMMONIA 152* 146*   Diabetic: No results for input(s): HGBA1C in the last 72 hours. No results for input(s): GLUCAP in the last 168 hours. Cardiac Enzymes: No results for input(s): CKTOTAL, CKMB, CKMBINDEX, TROPONINI in the last 168 hours. No results for input(s): PROBNP in the last 8760 hours. Coagulation Profile: Recent Labs  Lab 10/09/2020 1456 10/04/20 0724 October 17, 2020 0722  INR 2.0* 2.2* 2.1*   Thyroid Function Tests: Recent Labs    10/04/2020 1456  TSH 7.466*   Lipid Profile: No results for input(s): CHOL, HDL, LDLCALC, TRIG, CHOLHDL, LDLDIRECT in the last 72 hours. Anemia Panel: Recent Labs    10/04/20 1326  FERRITIN 466*   Urine analysis:  Component Value Date/Time   COLORURINE YELLOW (A) 04/22/2019 1223   APPEARANCEUR CLEAR (A) 04/22/2019 1223   APPEARANCEUR Clear 11/05/2013 0416   LABSPEC 1.010 04/22/2019 1223   LABSPEC 1.005 11/05/2013 0416   PHURINE 5.0 04/22/2019 1223   GLUCOSEU NEGATIVE 04/22/2019 1223   GLUCOSEU Negative 11/05/2013 0416   HGBUR NEGATIVE 04/22/2019 1223   BILIRUBINUR NEGATIVE 04/22/2019 1223   BILIRUBINUR 1+ 11/05/2013 0416   KETONESUR NEGATIVE 04/22/2019 1223   PROTEINUR NEGATIVE 04/22/2019 1223   NITRITE NEGATIVE 04/22/2019 1223   LEUKOCYTESUR NEGATIVE 04/22/2019 1223   LEUKOCYTESUR Negative 11/05/2013 0416   Sepsis Labs: Invalid input(s): PROCALCITONIN, LACTICIDVEN  Microbiology: Recent Results (from the past 240 hour(s))  Resp Panel by RT-PCR (Flu A&B, Covid) Nasopharyngeal Swab     Status: None    Collection Time: 10-08-20  9:28 PM   Specimen: Nasopharyngeal Swab; Nasopharyngeal(NP) swabs in vial transport medium  Result Value Ref Range Status   SARS Coronavirus 2 by RT PCR NEGATIVE NEGATIVE Final    Comment: (NOTE) SARS-CoV-2 target nucleic acids are NOT DETECTED.  The SARS-CoV-2 RNA is generally detectable in upper respiratory specimens during the acute phase of infection. The lowest concentration of SARS-CoV-2 viral copies this assay can detect is 138 copies/mL. A negative result does not preclude SARS-Cov-2 infection and should not be used as the sole basis for treatment or other patient management decisions. A negative result may occur with  improper specimen collection/handling, submission of specimen other than nasopharyngeal swab, presence of viral mutation(s) within the areas targeted by this assay, and inadequate number of viral copies(<138 copies/mL). A negative result must be combined with clinical observations, patient history, and epidemiological information. The expected result is Negative.  Fact Sheet for Patients:  BloggerCourse.com  Fact Sheet for Healthcare Providers:  SeriousBroker.it  This test is no t yet approved or cleared by the [place] FDA and  has been authorized for detection and/or diagnosis of SARS-CoV-2 by FDA under an Emergency Use Authorization (EUA). This EUA will remain  in effect (meaning this test can be used) for the duration of the COVID-19 declaration under Section 564(b)(1) of the Act, 21 U.S.C.section 360bbb-3(b)(1), unless the authorization is terminated  or revoked sooner.       Influenza A by PCR NEGATIVE NEGATIVE Final   Influenza B by PCR NEGATIVE NEGATIVE Final    Comment: (NOTE) The Xpert Xpress SARS-CoV-2/FLU/RSV plus assay is intended as an aid in the diagnosis of influenza from Nasopharyngeal swab specimens and should not be used as a sole basis for treatment.  Nasal washings and aspirates are unacceptable for Xpert Xpress SARS-CoV-2/FLU/RSV testing.  Fact Sheet for Patients: BloggerCourse.com  Fact Sheet for Healthcare Providers: SeriousBroker.it  This test is not yet approved or cleared by the [place] FDA and has been authorized for detection and/or diagnosis of SARS-CoV-2 by FDA under an Emergency Use Authorization (EUA). This EUA will remain in effect (meaning this test can be used) for the duration of the COVID-19 declaration under Section 564(b)(1) of the Act, 21 U.S.C. section 360bbb-3(b)(1), unless the authorization is terminated or revoked.  Performed at [facility], [address] [place], [place] [address]     Radiology Studies: No results found.    Park Beck T. Ysela Hettinger [facility]  If 7PM-7AM, please contact night-coverage [email or web address] 09/29/2020, 1:04 PM

## 2020-10-18 NOTE — Progress Notes (Signed)
Post mortem care was performed. Eye preservation performed per [facility] instructions. Awaiting transport to the morgue. Anselm Jungling

## 2020-10-18 NOTE — Progress Notes (Addendum)
Cross coverage  Family requesting to speak with physician at bedside for update on patient's condition given concern for mental status. Patient's sister and fianc at bedside.  Sister informs me that she is familiar with patient's condition when he was hospitalized 2 years prior but this is fianc's first time seeing him in this condition. Chart reviewed at bedside and explained patient's condition based on review of prior notes including gastroenterology notes and most recent labs available.  Discussed meld score and inability to calculate .  I did explain that patient's outlook appeared poor based on my review, pending further lab investigations assessing his response to therapy. CODE STATUS discussed.  Timothy Johns states that yesterday when patient was more alert he stated he wanted everything done.  Sister and fianc agree that patient should remain a full code.  Physical exam GENERAL: Lethargic, drowsy, icteric.  Arouses when name is called, able to respond to questions appropriately but slowly HEENT: MMM.  Sclerae icteric. NECK: Supple.  No apparent JVD.  RESP:  No IWOB.  Fair aeration bilaterally. CVS:  RRR. Heart sounds normal.  ABD/GI/GU: BS+.  Distended abdomen with ascites.  Mild discomfort but no tenderness.   SKIN: Jaundiced.  Decompensated liver failure Hepatic encephalopathy - Continue current management - Patient remains full code - Family will await primary attending and gastroenterology for further discussion on plan

## 2020-10-18 NOTE — ED Notes (Signed)
Pt assisted with attempting to use restroom, unsuccessful. Pt appears very uncomfortable. Will medicate with meds when up from pharmacy

## 2020-10-18 DEATH — deceased

## 2021-11-22 IMAGING — CT CT ABD-PELV W/O CM
2 of 4 series · 16 of 46 positions shown, 18 images · non-contrast
Comparison: 04/22/2019

CLINICAL DATA: Jaundice and abdominal distension over the past 10
days.

EXAM:
CT ABDOMEN AND PELVIS WITHOUT CONTRAST
TECHNIQUE: Multidetector CT imaging of the abdomen and pelvis was performed
following the standard protocol without IV contrast.

[Series 2: axial st · axial · 0.94mm/px · z∈[-536,-46]mm · 13 of 108 slices shown, 15 images]
[im 5/108  soft-tissue]
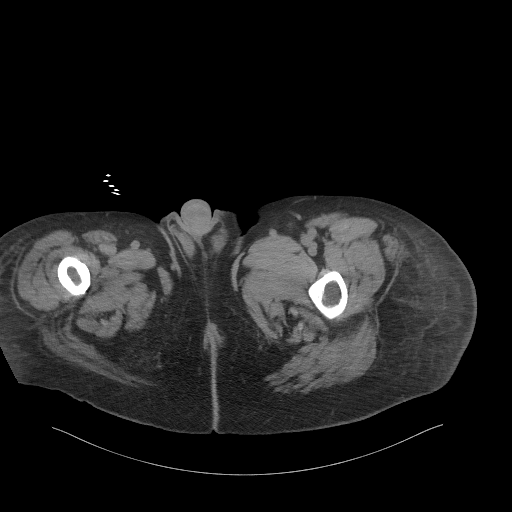
[im 5/108  bone]
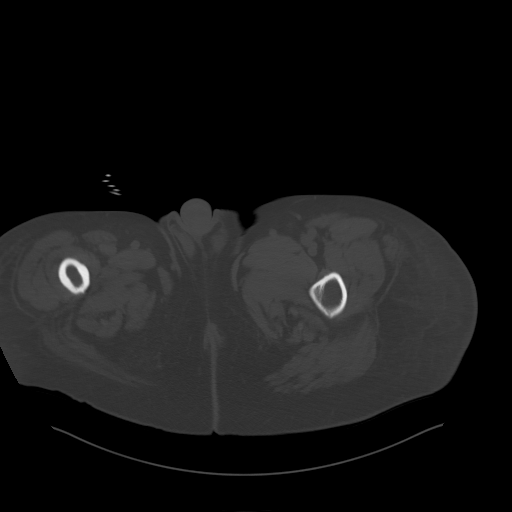
[im 15/108  soft-tissue]
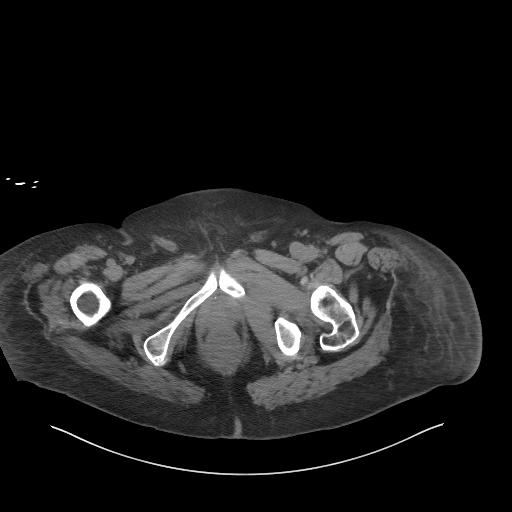
[im 25/108  soft-tissue]
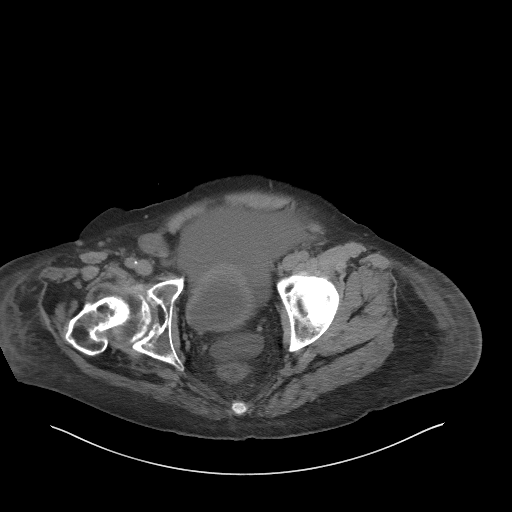
[im 30/108  soft-tissue]
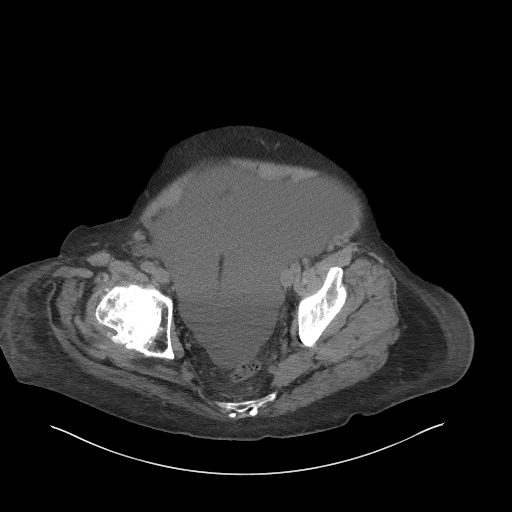
[im 39/108  soft-tissue]
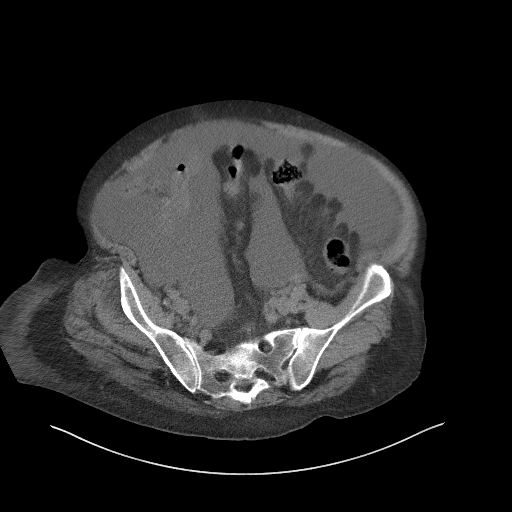
[im 44/108  soft-tissue]
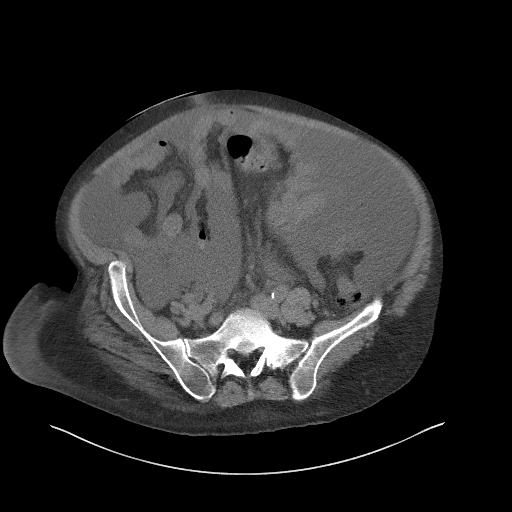
[im 54/108  soft-tissue]
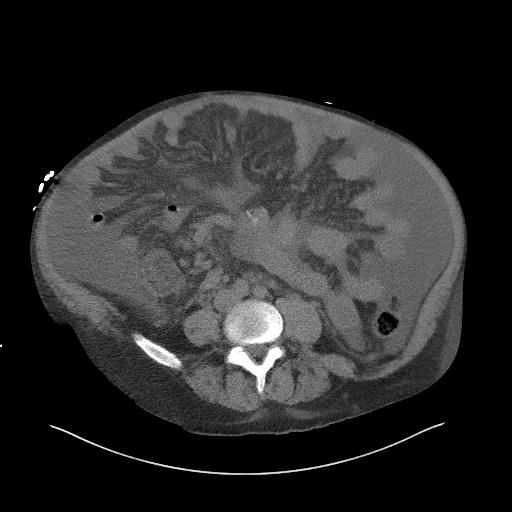
[im 64/108  soft-tissue]
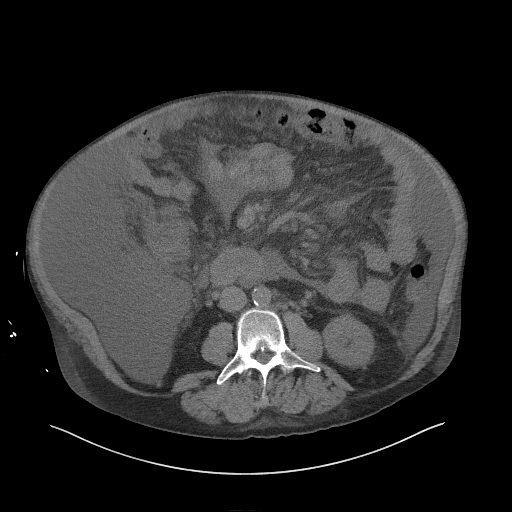
[im 69/108  soft-tissue]
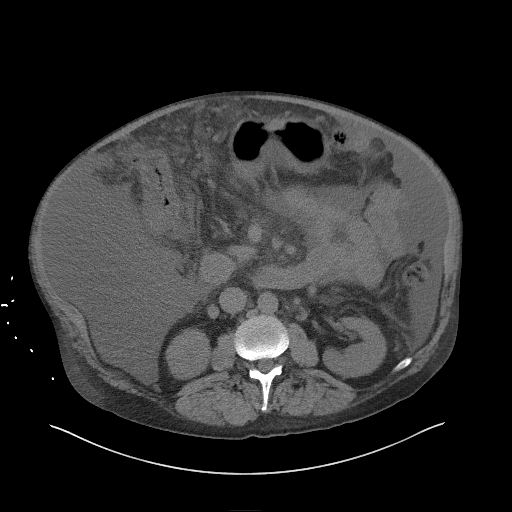
[im 69/108  bone]
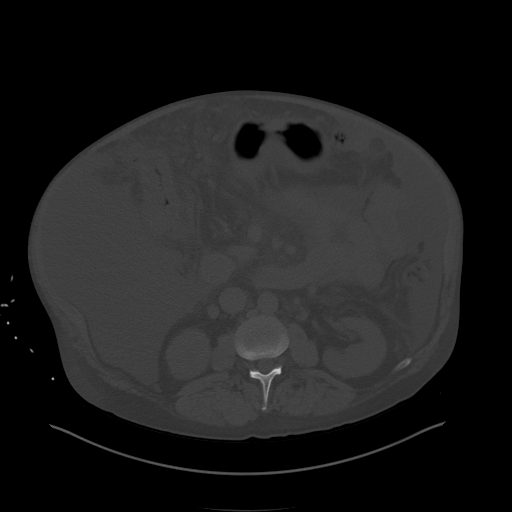
[im 78/108  soft-tissue]
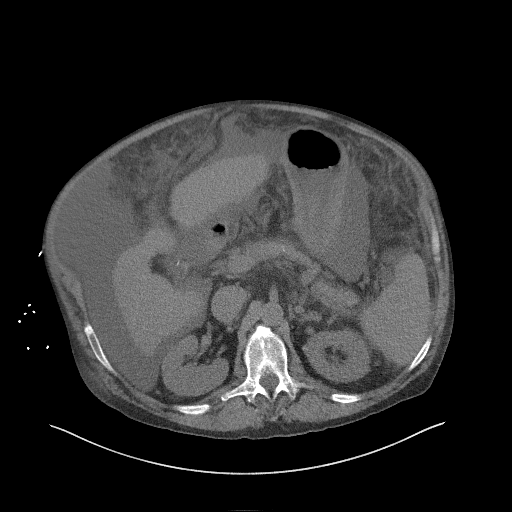
[im 83/108  soft-tissue]
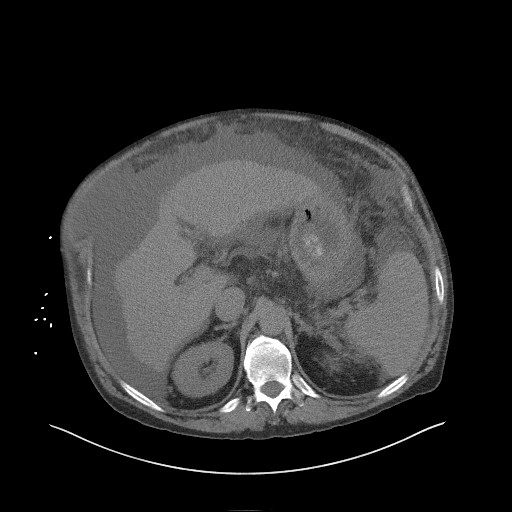
[im 93/108  soft-tissue]
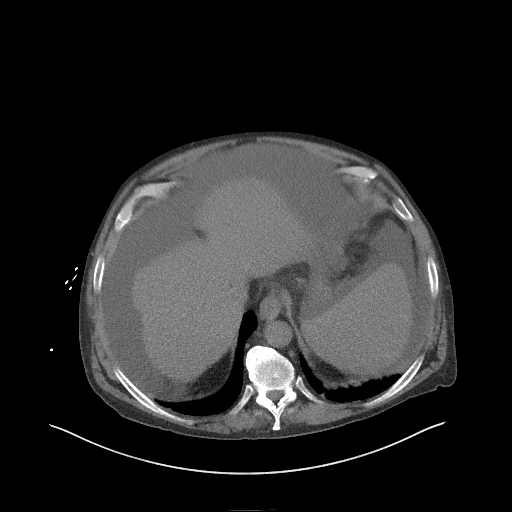
[im 103/108  soft-tissue]
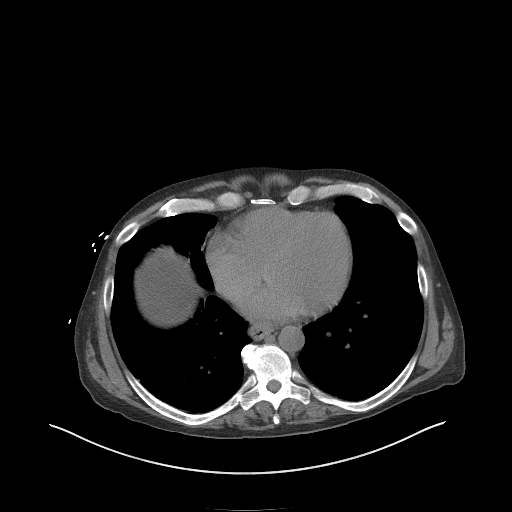

[Series 5: coronal st · coronal · 0.94mm/px · 3 of 122 slices shown]
[im 41/122  soft-tissue]
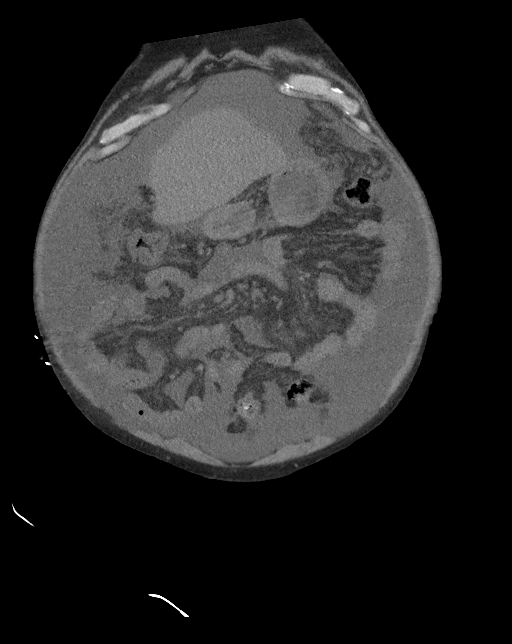
[im 54/122  soft-tissue]
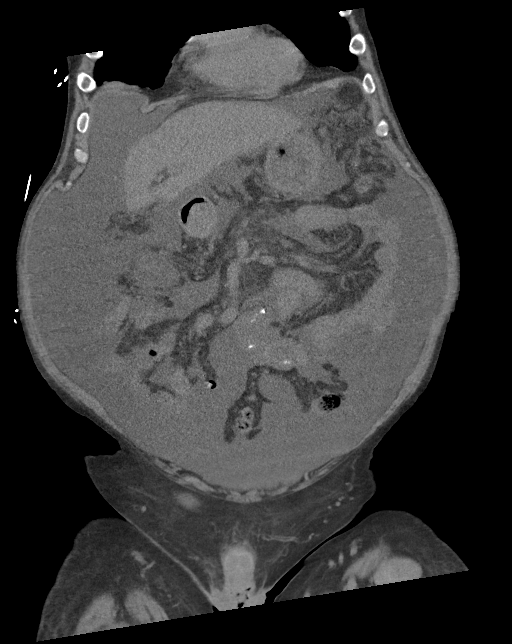
[im 68/122  soft-tissue]
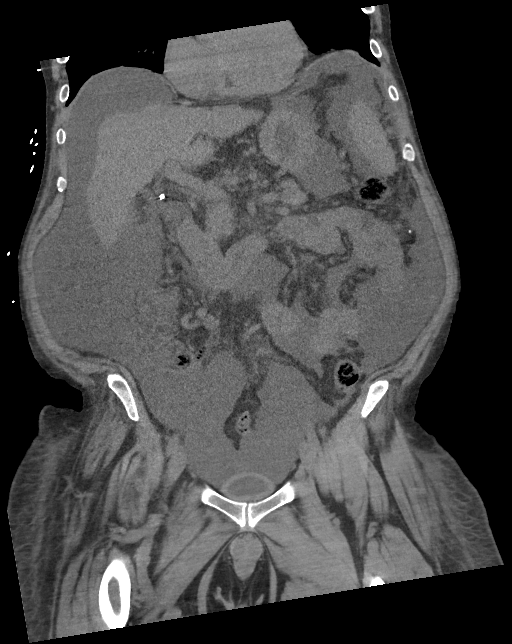

[16 of 46 positions shown; findings below may reference images not displayed]

FINDINGS: Lower chest: Right base volume loss with bibasilar subsegmental
atelectasis. Mild cardiomegaly, without pericardial or pleural
effusion.

Hepatobiliary: Advanced cirrhosis. Cholecystectomy, without biliary
ductal dilatation.

Pancreas: Normal, without mass or ductal dilatation.

Spleen: Borderline splenomegaly, 14.0 cm craniocaudal.

Adrenals/Urinary Tract: Normal adrenal glands. 5 mm upper pole left
renal lesion demonstrates complexity, likely a complex cyst. Normal
noncontrast appearance of the right kidney. No hydronephrosis. No
hydroureter or ureteric calculi. No bladder calculi.

Stomach/Bowel: Normal stomach, without wall thickening. Right-sided
colonic wall thickening including on 39/2. Normal terminal ileum and
appendix. Normal small bowel.

Vascular/Lymphatic: Aortic atherosclerosis. Portal venous
hypertension, with portosystemic collaterals which are more apparent
on the prior exam. No abdominopelvic adenopathy.

Reproductive: Normal prostate.

Other: Moderate to large volume abdominopelvic ascites. Bilateral
small inguinal hernias. The left-sided hernia contains fat and the
right-sided hernia contains fluid. Diffuse mesenteric and omental
edema.

Reduction or repair of previously described ventral abdominal wall
hernia.

Musculoskeletal: Advanced right hip osteoarthritis. Again identified
is left-sided and probable right-sided avascular necrosis.
IMPRESSION: 1. Cirrhosis and moderate to large volume abdominopelvic ascites.
2. Ascending colonic wall thickening. Most likely related to portal
venous hypertension. Infectious colitis cannot be excluded.
3. Right-sided fluid and left-sided fat containing small inguinal
hernias.
4.  Aortic Atherosclerosis ([id number]).

## 2021-11-22 IMAGING — DX DG CHEST 1V PORT
1 series · 1 of 1 positions shown · non-contrast
Comparison: 02/12/2019

CLINICAL DATA: Volume overload, jaundice

EXAM:
PORTABLE CHEST 1 VIEW

[chest ap]
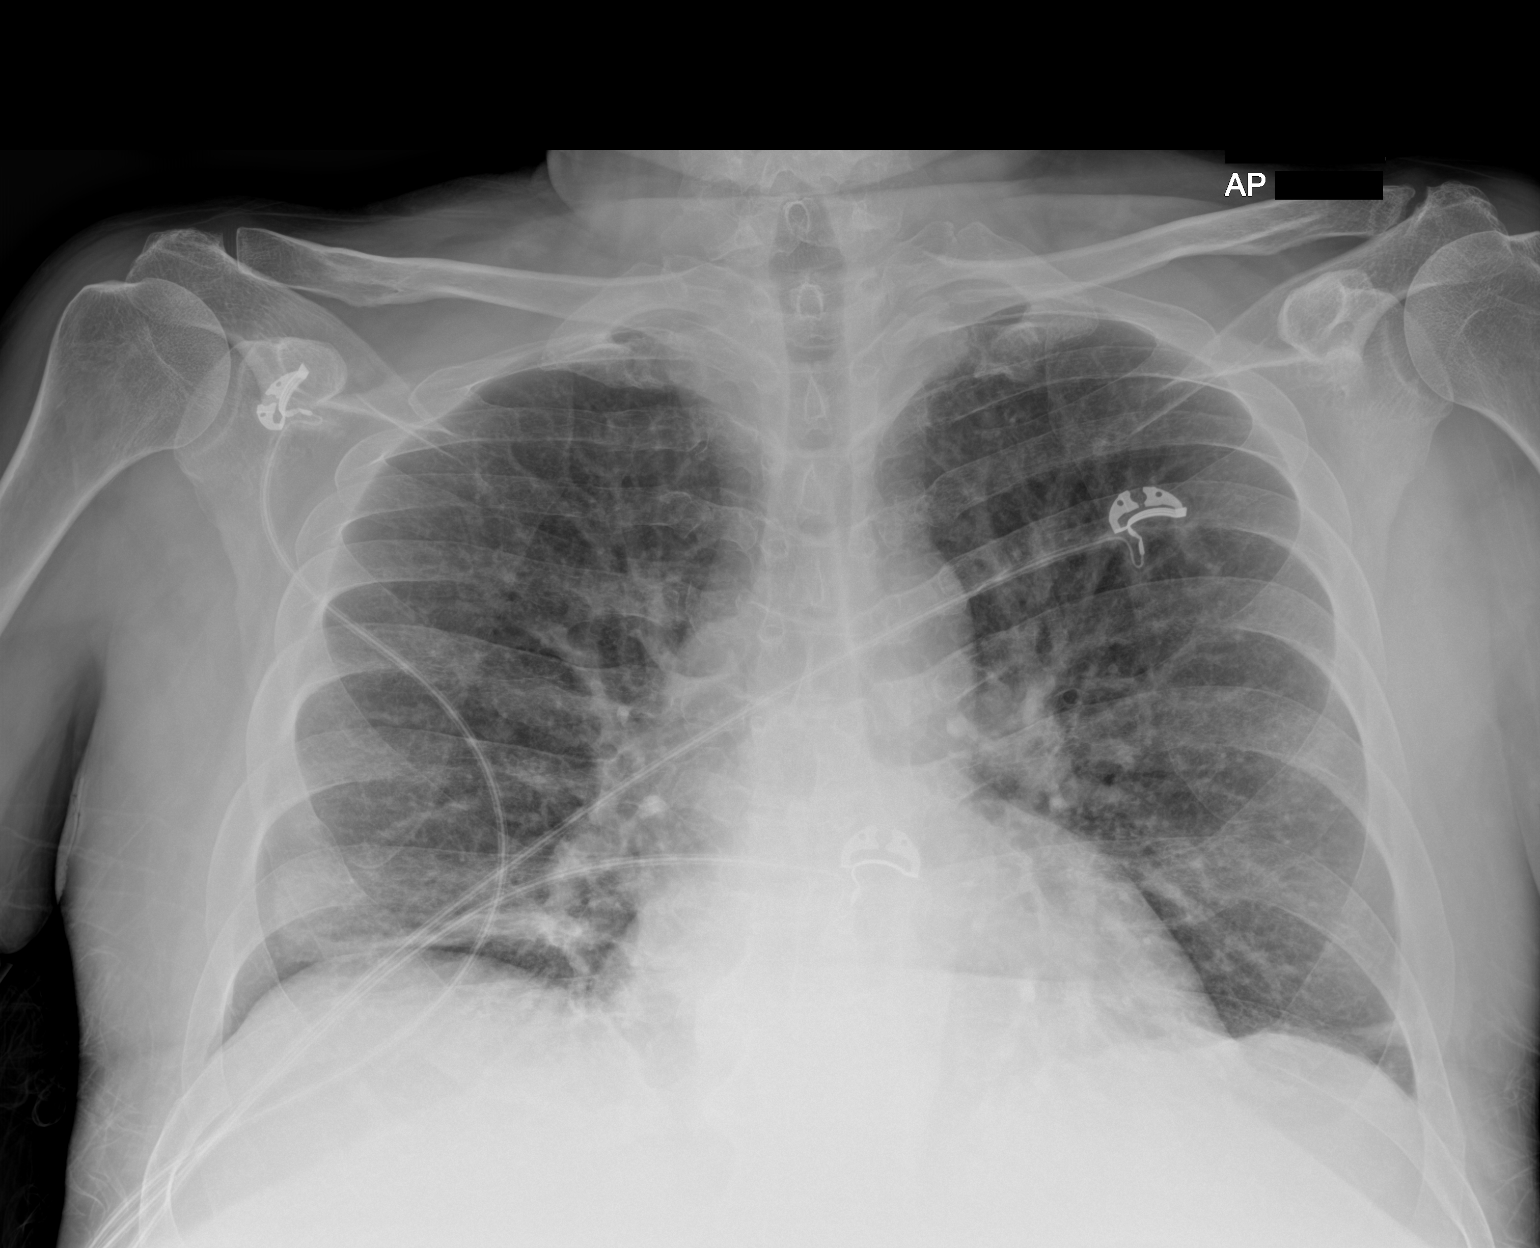

[1 of 1 positions shown; findings below may reference images not displayed]

FINDINGS: Low lung volumes, with bronchovascular crowding. The heart size and
mediastinal contours are within normal limits given technique.
Streaky opacities in the right-greater-than-left lung base, likely
atelectasis. No pleural effusion or pneumothorax. No acute osseous
abnormality.
IMPRESSION: Low lung volumes with linear opacities in the lung bases, which are
favored to represent atelectasis.
